# Patient Record
Sex: Female | Born: 1960 | Race: White | Hispanic: No | Marital: Married | Smoking: Never smoker
Health system: Southern US, Community
[De-identification: ages and names within clinical notes are randomized; demographics above are authoritative.]

## PROBLEM LIST (undated history)

## (undated) DIAGNOSIS — E785 Hyperlipidemia, unspecified: Secondary | ICD-10-CM

## (undated) DIAGNOSIS — D649 Anemia, unspecified: Secondary | ICD-10-CM

## (undated) DIAGNOSIS — L309 Dermatitis, unspecified: Secondary | ICD-10-CM

## (undated) DIAGNOSIS — L509 Urticaria, unspecified: Secondary | ICD-10-CM

## (undated) DIAGNOSIS — T4145XA Adverse effect of unspecified anesthetic, initial encounter: Secondary | ICD-10-CM

## (undated) DIAGNOSIS — R112 Nausea with vomiting, unspecified: Secondary | ICD-10-CM

## (undated) DIAGNOSIS — T7840XA Allergy, unspecified, initial encounter: Secondary | ICD-10-CM

## (undated) DIAGNOSIS — J329 Chronic sinusitis, unspecified: Secondary | ICD-10-CM

## (undated) DIAGNOSIS — R51 Headache: Secondary | ICD-10-CM

## (undated) DIAGNOSIS — Z9889 Other specified postprocedural states: Secondary | ICD-10-CM

## (undated) DIAGNOSIS — K219 Gastro-esophageal reflux disease without esophagitis: Secondary | ICD-10-CM

## (undated) DIAGNOSIS — T8859XA Other complications of anesthesia, initial encounter: Secondary | ICD-10-CM

## (undated) DIAGNOSIS — J189 Pneumonia, unspecified organism: Secondary | ICD-10-CM

## (undated) DIAGNOSIS — T783XXA Angioneurotic edema, initial encounter: Secondary | ICD-10-CM

## (undated) HISTORY — DX: Allergy, unspecified, initial encounter: T78.40XA

## (undated) HISTORY — PX: TONSILLECTOMY AND ADENOIDECTOMY: SUR1326

## (undated) HISTORY — PX: TYMPANOSTOMY TUBE PLACEMENT: SHX32

## (undated) HISTORY — PX: DILATION AND CURETTAGE OF UTERUS: SHX78

## (undated) HISTORY — PX: ADENOIDECTOMY: SUR15

## (undated) HISTORY — DX: Dermatitis, unspecified: L30.9

## (undated) HISTORY — DX: Angioneurotic edema, initial encounter: T78.3XXA

## (undated) HISTORY — PX: SINOSCOPY: SHX187

## (undated) HISTORY — PX: TYMPANOPLASTY: SHX33

## (undated) HISTORY — DX: Chronic sinusitis, unspecified: J32.9

## (undated) HISTORY — PX: ENDOMETRIAL ABLATION: SHX621

## (undated) HISTORY — DX: Urticaria, unspecified: L50.9

## (undated) HISTORY — DX: Hyperlipidemia, unspecified: E78.5

## (undated) HISTORY — PX: HEAD & NECK WOUND REPAIR / CLOSURE: SUR1142

## (undated) HISTORY — PX: SKIN GRAFT: SHX250

---

## 1987-08-26 HISTORY — PX: FACIAL FRACTURE SURGERY: SHX1570

## 2005-02-14 ENCOUNTER — Ambulatory Visit (HOSPITAL_BASED_OUTPATIENT_CLINIC_OR_DEPARTMENT_OTHER): Admission: RE | Admit: 2005-02-14 | Discharge: 2005-02-14 | Payer: Self-pay | Admitting: Otolaryngology

## 2005-02-14 ENCOUNTER — Ambulatory Visit (HOSPITAL_COMMUNITY): Admission: RE | Admit: 2005-02-14 | Discharge: 2005-02-14 | Payer: Self-pay | Admitting: Otolaryngology

## 2006-05-11 ENCOUNTER — Other Ambulatory Visit: Admission: RE | Admit: 2006-05-11 | Discharge: 2006-05-11 | Payer: Self-pay | Admitting: Obstetrics and Gynecology

## 2006-09-11 ENCOUNTER — Ambulatory Visit (HOSPITAL_COMMUNITY): Admission: RE | Admit: 2006-09-11 | Discharge: 2006-09-11 | Payer: Self-pay | Admitting: Family Medicine

## 2007-07-28 ENCOUNTER — Ambulatory Visit (HOSPITAL_COMMUNITY): Admission: RE | Admit: 2007-07-28 | Discharge: 2007-07-28 | Payer: Self-pay | Admitting: Family Medicine

## 2007-12-08 ENCOUNTER — Other Ambulatory Visit: Admission: RE | Admit: 2007-12-08 | Discharge: 2007-12-08 | Payer: Self-pay | Admitting: Obstetrics and Gynecology

## 2008-12-11 ENCOUNTER — Other Ambulatory Visit: Admission: RE | Admit: 2008-12-11 | Discharge: 2008-12-11 | Payer: Self-pay | Admitting: Obstetrics and Gynecology

## 2009-12-12 ENCOUNTER — Other Ambulatory Visit: Admission: RE | Admit: 2009-12-12 | Discharge: 2009-12-12 | Payer: Self-pay | Admitting: Obstetrics and Gynecology

## 2010-12-31 ENCOUNTER — Other Ambulatory Visit (HOSPITAL_COMMUNITY)
Admission: RE | Admit: 2010-12-31 | Discharge: 2010-12-31 | Disposition: A | Discharge status: Home / Self Care | Payer: BC Managed Care – PPO | Source: Ambulatory Visit | Attending: Obstetrics and Gynecology | Admitting: Obstetrics and Gynecology

## 2010-12-31 ENCOUNTER — Other Ambulatory Visit: Payer: Self-pay | Admitting: Obstetrics and Gynecology

## 2010-12-31 DIAGNOSIS — Z01419 Encounter for gynecological examination (general) (routine) without abnormal findings: Secondary | ICD-10-CM | POA: Insufficient documentation

## 2011-01-10 NOTE — Op Note (Signed)
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   POSTOPERATIVE DIAGNOSIS:  Right tympanic membrane perforation with  conductive hearing loss.   PROCEDURE:  Right underlay tympanoplasty.   SURGEON:  Lucky Cowboy, M.D.   ANESTHESIA:  General endotracheal anesthesia.   ESTIMATED BLOOD LOSS:  20 mL.   SPECIMENS:  None.   COMPLICATIONS:  None.   INDICATIONS:  This patient is a 50 year old female with a very long history  of right tympanic membrane perforation and at least a 35 dB conductive  hearing loss.  She has had problems with intermittent otorrhea, which has  been resolved for several months.  For these reasons, the above procedure is  performed.   FINDINGS:  The patient was noted to have approximately 75% tympanic membrane  perforation involving the anterior superior, inferior and posterior inferior  quadrants.   PROCEDURE:  The patient was taken to the operating room and placed on the  table in the supine position.  She was then placed under general  endotracheal anesthesia and the table rotated counterclockwise 90 degrees.  The postauricular area was shaved and prepped with Betadine and draped in  the usual sterile fashion.  The patient was then prepped with Betadine and  draped in the usual sterile fashion. Lidocaine 1% with the 100,000  epinephrine was used to inject the right postauricular region.  After  allowing time for vasoconstrictive effect, the postauricular curvilinear  incision was made approximately 1 cm posterior to the postauricular sulcus.  Subcutaneous  suprafascial flaps were elevated anteriorly.  A fascia graft  was harvested superiorly and put in a fascia press and dried.  The  temporalis fascia was then divided in a T-shaped fashion using a #15 blade.  The fascia was then elevated anteriorly using a periosteal elevator.  Please  note that the four-quadrant injection was first performed.  This was using  1% lidocaine with 1:100,000 of epinephrine.  Once this was performed, a  Beaver blade was used to make postauricular 180-degree incision with back  cuts down to the tympanic membrane at 12 o'clock and 6 o'clock.  The flap  was then elevated anteriorly and approximately 1 mm and posteriorly 1 mm so  that it could be identified from the postauricular approach.   Back to the post auricular approach, the pinna was elevated anteriorly, held  in place with a Penrose.  The microscope was then used to evaluate the point  of entry into the ear canal, which was then identified.  The self-retaining  retractors were placed.  The tympanomeatal flap was then elevated and entry  was performed into the posterior inferior portion of the middle ear space.  The tympanic membrane was then elevated.  The graft was then further dried.  It was contoured to the necessary size of the graft.  The middle ear was  filled with Gelfoam soaked with Ciprodex otic.  The underlay graft was then  placed.  The edges of the tympanic membrane perforation had been previously  freshened while the tympanic membrane with its original native position.  The graft was then replaced and in position and the tympanic membrane laid  down.  It was then laid out into the ear canal and Gelfoam placed on top and  used to fill the ear canal.  The ear was placed into its original position.  There was closure of the T-shaped fascial incision in a simple interrupted  fashion using 3-0 Vicryl.  Care was used to re-elevate the skin flaps in the  ear canal.  Further Gelfoam was placed.   Subcutaneous tissues were  reapproximated again in a simple interrupted buried fashion and the skin  closed in a running interlocking stitch using 5-0 Prolene.  A mastoid  dressing was applied.  The table was rotated clockwise 90 degrees to its  original position and the patient awakened from anesthesia.  She was taken  to the Post Anesthesia Care Unit in stable condition.  There were no  complications.      Lucky Cowboy, MD  Electronically Signed     SJ/MEDQ  D:  04/10/2005  T:  04/11/2005  Job:  [id number]

## 2011-01-30 ENCOUNTER — Other Ambulatory Visit: Payer: Self-pay | Admitting: Obstetrics and Gynecology

## 2011-04-15 ENCOUNTER — Telehealth: Payer: Self-pay

## 2011-04-15 DIAGNOSIS — Z139 Encounter for screening, unspecified: Secondary | ICD-10-CM

## 2011-04-15 NOTE — Telephone Encounter (Signed)
Gastroenterology Pre-Procedure Form  Request Date: 04/15/2011    Requesting Physician: Lilyan Punt     PATIENT INFORMATION:  Joyce Jacobs is a 50 y.o., female (DOB=1961-01-06).  PROCEDURE: Procedure(s) requested: colonoscopy Procedure Reason: screening for colon cancer  PATIENT REVIEW QUESTIONS: The patient reports the following:   1. Diabetes Melitis: no 2. Joint replacements in the past 12 months: no 3. Major health problems in the past 3 months: no 4. Has an artificial valve or MVP:no 5. Has been advised in past to take antibiotics in advance of a procedure like teeth cleaning: no}    MEDICATIONS & ALLERGIES:    Patient reports the following regarding taking any blood thinners:   Plavix? no Aspirin?no Coumadin?  no  Patient confirms/reports the following medications:  Current Outpatient Prescriptions  Medication Sig Dispense Refill  . baclofen (LIORESAL) 10 MG tablet Take 10 mg by mouth daily.        . fish oil-omega-3 fatty acids 1000 MG capsule Take 1 g by mouth daily.        . fluticasone (FLONASE) 50 MCG/ACT nasal spray Place 2 sprays into the nose daily. As directed       . guaiFENesin (MUCINEX) 600 MG 12 hr tablet Take 1,200 mg by mouth.        Marland Kitchen omeprazole (PRILOSEC OTC) 20 MG tablet Take 20 mg by mouth daily. As needed       . topiramate (TOPAMAX) 100 MG tablet Take 100 mg by mouth 2 (two) times daily.        Marland Kitchen UNKNOWN TO PATIENT Imitrex     As needed       . UNKNOWN TO PATIENT Vitamin D2      One tablet twice daily         Patient confirms/reports the following allergies:  Allergies  Allergen Reactions  . Codeine Nausea Only  . Vicodin (Hydrocodone-Acetaminophen) Itching    Patient is appropriate to schedule for requested procedure(s): yes  AUTHORIZATION INFORMATION Primary Insurance:   ID #:   Group Pre-Cert / Auth required Pre-Cert / Auth #:   Secondary Insurance:   ID  Pre-Cert / Auth required: Pre-Cert / Auth #:   Orders Placed This  Encounter  Procedures  . Procedural/ Surgical Case Request: COLONOSCOPY    Standing Status: Future     Number of Occurrences:      Standing Expiration Date: 04/14/2012    Order Specific Question:  Pre-op diagnosis    Answer:  Screening    SCHEDULE INFORMATION: Procedure has been scheduled as follows:  Date: 05/12/2011     Time: 8:30 AM  Location: [facility] Short Stay  This Gastroenterology Pre-Precedure Form is being routed to the following provider(s) for review: Lorenza Burton, NP    Mailed the Rx and instructions today, 04/16/2011.

## 2011-04-15 NOTE — Telephone Encounter (Signed)
OK for colonoscopy.  

## 2011-05-09 MED ORDER — SODIUM CHLORIDE 0.45 % IV SOLN
Freq: Once | INTRAVENOUS | Status: AC
  Administered 2011-05-12: 08:00:00 via INTRAVENOUS

## 2011-05-12 ENCOUNTER — Ambulatory Visit (HOSPITAL_COMMUNITY)
Admission: RE | Admit: 2011-05-12 | Discharge: 2011-05-12 | Disposition: A | Discharge status: Home / Self Care | Payer: BC Managed Care – PPO | Source: Ambulatory Visit | Attending: Internal Medicine | Admitting: Internal Medicine

## 2011-05-12 ENCOUNTER — Encounter (HOSPITAL_COMMUNITY)
Admission: RE | Disposition: A | Discharge status: Home / Self Care | Payer: Self-pay | Source: Ambulatory Visit | Attending: Internal Medicine

## 2011-05-12 ENCOUNTER — Encounter (HOSPITAL_COMMUNITY): Payer: Self-pay

## 2011-05-12 DIAGNOSIS — Z1211 Encounter for screening for malignant neoplasm of colon: Secondary | ICD-10-CM | POA: Insufficient documentation

## 2011-05-12 HISTORY — DX: Anemia, unspecified: D64.9

## 2011-05-12 HISTORY — DX: Adverse effect of unspecified anesthetic, initial encounter: T41.45XA

## 2011-05-12 HISTORY — DX: Other complications of anesthesia, initial encounter: T88.59XA

## 2011-05-12 HISTORY — PX: COLONOSCOPY: SHX5424

## 2011-05-12 HISTORY — DX: Gastro-esophageal reflux disease without esophagitis: K21.9

## 2011-05-12 HISTORY — DX: Headache: R51

## 2011-05-12 SURGERY — COLONOSCOPY
Anesthesia: Moderate Sedation

## 2011-05-12 MED ORDER — MEPERIDINE HCL 100 MG/ML IJ SOLN
INTRAMUSCULAR | Status: DC | PRN
  Administered 2011-05-12 (×2): 50 mg via INTRAVENOUS

## 2011-05-12 MED ORDER — ONDANSETRON HCL 4 MG/2ML IJ SOLN
INTRAMUSCULAR | Status: AC
  Administered 2011-05-12: 4 mg via INTRAVENOUS
  Filled 2011-05-12: qty 2

## 2011-05-12 MED ORDER — ONDANSETRON HCL 4 MG/2ML IJ SOLN
4.0000 mg | Freq: Once | INTRAMUSCULAR | Status: AC
  Administered 2011-05-12: 4 mg via INTRAVENOUS

## 2011-05-12 MED ORDER — MIDAZOLAM HCL 5 MG/5ML IJ SOLN
INTRAMUSCULAR | Status: DC | PRN
  Administered 2011-05-12 (×2): 2 mg via INTRAVENOUS

## 2011-05-12 MED ORDER — MEPERIDINE HCL 100 MG/ML IJ SOLN
INTRAMUSCULAR | Status: AC
  Filled 2011-05-12: qty 2

## 2011-05-12 MED ORDER — MIDAZOLAM HCL 5 MG/5ML IJ SOLN
INTRAMUSCULAR | Status: AC
  Filled 2011-05-12: qty 10

## 2011-05-12 MED ORDER — STERILE WATER FOR IRRIGATION IR SOLN
Status: DC | PRN
  Administered 2011-05-12: 08:00:00

## 2011-05-12 NOTE — H&P (Signed)
Primary Care Physician:  No primary provider on file. Primary Gastroenterologist:  Dr.  Jena Gauss  Pre-Procedure History & Physical: HPI:  Joyce Jacobs is a 50 y.o. female is here for a screening colonoscopy. Here for her first ever average risk screening colonoscopy. No family history of polyps or colon cancer. No lower GI tract symptoms.  Past Medical History  Diagnosis Date  . Complication of anesthesia     nausea and vomiting  . Asthma     allergy related  . Headache     migraines  . Anemia   . GERD (gastroesophageal reflux disease)     Past Surgical History  Procedure Date  . Dilation and curettage of uterus   . Cesarean section     times three  . Endometrial ablation   . Head & neck wound repair / closure     times 5    Prior to Admission medications   Medication Sig Start Date End Date Taking? Authorizing Provider  baclofen (LIORESAL) 10 MG tablet Take 10 mg by mouth daily.     Yes Historical Provider, MD  fish oil-omega-3 fatty acids 1000 MG capsule Take 1 g by mouth daily.     Yes Historical Provider, MD  fluticasone (FLONASE) 50 MCG/ACT nasal spray Place 2 sprays into the nose daily. As directed    Yes Historical Provider, MD  guaiFENesin (MUCINEX) 600 MG 12 hr tablet Take 1,200 mg by mouth.     Yes Historical Provider, MD  omeprazole (PRILOSEC OTC) 20 MG tablet Take 20 mg by mouth daily. As needed    Yes Historical Provider, MD  topiramate (TOPAMAX) 100 MG tablet Take 100 mg by mouth 2 (two) times daily.     Yes Historical Provider, MD  UNKNOWN TO PATIENT Imitrex     As needed    Yes Historical Provider, MD  UNKNOWN TO PATIENT Vitamin D2      One tablet twice daily    Yes Historical Provider, MD    Allergies as of 04/15/2011 - Review Complete 04/15/2011  Allergen Reaction Noted  . Codeine Nausea Only 04/15/2011  . Vicodin (hydrocodone-acetaminophen) Itching 04/15/2011    History reviewed. No pertinent family history.  History   Social History  .  Marital Status: Married    Spouse Name: N/A    Number of Children: N/A  . Years of Education: N/A   Occupational History  . Not on file.   Social History Main Topics  . Smoking status: Never Smoker   . Smokeless tobacco: Not on file  . Alcohol Use: No  . Drug Use: No  . Sexually Active:    Other Topics Concern  . Not on file   Social History Narrative  . No narrative on file    Review of Systems: See HPI, otherwise negative ROS  Physical Exam: BP 120/75  Pulse 81  Temp(Src) 98 F (36.7 C) (Oral)  Resp 19  Ht 5\' 4"  (1.626 m)  Wt 148 lb (67.132 kg)  BMI 25.40 kg/m2  SpO2 98%  LMP 04/28/2011 General:   Alert,  Well-developed, well-nourished, pleasant and cooperative in NAD Head:  Normocephalic and atraumatic. Eyes:  Sclera clear, no icterus.   Conjunctiva pink. Ears:  Normal auditory acuity. Nose:  No deformity, discharge,  or lesions. Mouth:  No deformity or lesions, dentition normal. Neck:  Supple; no masses or thyromegaly. Lungs:  Clear throughout to auscultation.   No wheezes, crackles, or rhonchi. No acute distress. Heart:  Regular rate and rhythm;  no murmurs, clicks, rubs,  or gallops. Abdomen:  Soft, nontender and nondistended. No masses, hepatosplenomegaly or hernias noted. Normal bowel sounds, without guarding, and without rebound.   Msk:  Symmetrical without gross deformities. Normal posture. Pulses:  Normal pulses noted. Extremities:  Without clubbing or edema. Neurologic:  Alert and  oriented x4;  grossly normal neurologically. Skin:  Intact without significant lesions or rashes. Cervical Nodes:  No significant cervical adenopathy. Psych:  Alert and cooperative. Normal mood and affect.  Impression/Plan: Joyce Jacobs is now here to undergo a screening colonoscopy.  Risks, benefits, limitations, imponderables and alternatives regarding colonoscopy have been reviewed with the patient. Questions have been answered. All parties agreeable.

## 2011-05-20 ENCOUNTER — Encounter (HOSPITAL_COMMUNITY): Payer: Self-pay | Admitting: Internal Medicine

## 2012-02-04 ENCOUNTER — Other Ambulatory Visit (HOSPITAL_COMMUNITY)
Admission: RE | Admit: 2012-02-04 | Discharge: 2012-02-04 | Disposition: A | Discharge status: Home / Self Care | Payer: BC Managed Care – PPO | Source: Ambulatory Visit | Attending: Obstetrics and Gynecology | Admitting: Obstetrics and Gynecology

## 2012-02-04 ENCOUNTER — Other Ambulatory Visit: Payer: Self-pay | Admitting: Obstetrics and Gynecology

## 2012-02-04 DIAGNOSIS — Z1159 Encounter for screening for other viral diseases: Secondary | ICD-10-CM | POA: Insufficient documentation

## 2012-02-04 DIAGNOSIS — Z01419 Encounter for gynecological examination (general) (routine) without abnormal findings: Secondary | ICD-10-CM | POA: Insufficient documentation

## 2012-02-12 ENCOUNTER — Other Ambulatory Visit: Payer: Self-pay | Admitting: Family Medicine

## 2012-02-12 DIAGNOSIS — R109 Unspecified abdominal pain: Secondary | ICD-10-CM

## 2012-02-17 ENCOUNTER — Other Ambulatory Visit: Payer: Self-pay | Admitting: Family Medicine

## 2012-02-17 ENCOUNTER — Ambulatory Visit (HOSPITAL_COMMUNITY)
Admission: RE | Admit: 2012-02-17 | Discharge: 2012-02-17 | Disposition: A | Discharge status: Home / Self Care | Payer: BC Managed Care – PPO | Source: Ambulatory Visit | Attending: Family Medicine | Admitting: Family Medicine

## 2012-02-17 DIAGNOSIS — R9389 Abnormal findings on diagnostic imaging of other specified body structures: Secondary | ICD-10-CM | POA: Insufficient documentation

## 2012-02-17 DIAGNOSIS — R109 Unspecified abdominal pain: Secondary | ICD-10-CM

## 2012-02-17 DIAGNOSIS — Q619 Cystic kidney disease, unspecified: Secondary | ICD-10-CM | POA: Insufficient documentation

## 2012-02-17 MED ORDER — IOHEXOL 300 MG/ML  SOLN
100.0000 mL | Freq: Once | INTRAMUSCULAR | Status: AC | PRN
  Administered 2012-02-17: 100 mL via INTRAVENOUS

## 2012-11-24 ENCOUNTER — Encounter: Payer: Self-pay | Admitting: *Deleted

## 2012-11-26 ENCOUNTER — Encounter: Payer: Self-pay | Admitting: Family Medicine

## 2012-11-26 ENCOUNTER — Ambulatory Visit (INDEPENDENT_AMBULATORY_CARE_PROVIDER_SITE_OTHER): Payer: BC Managed Care – PPO | Admitting: Family Medicine

## 2012-11-26 VITALS — BP 106/72 | Temp 98.2°F | Wt 155.4 lb

## 2012-11-26 DIAGNOSIS — J309 Allergic rhinitis, unspecified: Secondary | ICD-10-CM | POA: Insufficient documentation

## 2012-11-26 DIAGNOSIS — J322 Chronic ethmoidal sinusitis: Secondary | ICD-10-CM

## 2012-11-26 MED ORDER — FLUTICASONE PROPIONATE 50 MCG/ACT NA SUSP: NASAL | Status: DC

## 2012-11-26 MED ORDER — AMOXICILLIN-POT CLAVULANATE 875-125 MG PO TABS: 1.0000 | ORAL_TABLET | Freq: Two times a day (BID) | ORAL | Status: AC

## 2012-11-26 NOTE — Progress Notes (Signed)
Subjective:    Patient ID: Joyce Jacobs, female    DOB: 1961-04-02, 52 y.o.   MRN: [id number]  Sore Throat  This is a new problem. The current episode started in the past 7 days. The problem has been gradually worsening. The pain is worse on the left side. The maximum temperature recorded prior to her arrival was 100 - 100.9 F. The fever has been present for 1 to 2 days. The pain is at a severity of 5/10. The pain is moderate. Associated symptoms include congestion, coughing, ear pain and headaches. Associated symptoms comments: Frontal and maxillary. She has tried cool liquids and gargles for the symptoms. The treatment provided mild relief.  Otalgia  Associated symptoms include coughing and headaches.  Sinus Problem Associated symptoms include congestion, coughing, ear pain and headaches.      Review of Systems  Constitutional: Positive for fatigue.  HENT: Positive for ear pain and congestion.   Respiratory: Positive for cough.   Neurological: Positive for headaches.  All other systems reviewed and are negative.       Objective:   Physical Exam  Alert hydration good. HEENT moderate nasal congestion. Frontal tenderness. Vitals reviewed. Lungs clear. Heart rare rhythm.      Assessment & Plan:  Impression acute sinusitis. Also complicated by some allergy rhinitis. Plan Augmentin 875 twice a day 10 days. Symptomatic care discussed. WSL

## 2013-01-20 ENCOUNTER — Telehealth: Payer: Self-pay | Admitting: Family Medicine

## 2013-01-20 NOTE — Telephone Encounter (Signed)
Patient needs a doctors note saying that she can drink water during EOG testing due to medical reasons.

## 2013-01-20 NOTE — Telephone Encounter (Signed)
Please write this on letterhead and I will sign it

## 2013-01-21 NOTE — Telephone Encounter (Signed)
Letter ready for pick up. Pt notified on voicemail.

## 2013-03-02 ENCOUNTER — Other Ambulatory Visit: Payer: Self-pay | Admitting: Family Medicine

## 2013-03-02 LAB — GLUCOSE, RANDOM: Glucose, Bld: 98 mg/dL (ref 70–99)

## 2013-03-03 ENCOUNTER — Encounter: Payer: Self-pay | Admitting: Family Medicine

## 2013-03-15 ENCOUNTER — Other Ambulatory Visit: Payer: Self-pay | Admitting: Family Medicine

## 2013-04-05 ENCOUNTER — Encounter: Payer: Self-pay | Admitting: Nurse Practitioner

## 2013-04-05 ENCOUNTER — Ambulatory Visit (INDEPENDENT_AMBULATORY_CARE_PROVIDER_SITE_OTHER): Payer: BC Managed Care – PPO | Admitting: Nurse Practitioner

## 2013-04-05 VITALS — BP 122/80 | HR 80 | Ht 63.25 in | Wt 157.6 lb

## 2013-04-05 DIAGNOSIS — Z01419 Encounter for gynecological examination (general) (routine) without abnormal findings: Secondary | ICD-10-CM

## 2013-04-05 DIAGNOSIS — M7660 Achilles tendinitis, unspecified leg: Secondary | ICD-10-CM

## 2013-04-05 DIAGNOSIS — Z0000 Encounter for general adult medical examination without abnormal findings: Secondary | ICD-10-CM

## 2013-04-05 DIAGNOSIS — M7662 Achilles tendinitis, left leg: Secondary | ICD-10-CM

## 2013-04-05 DIAGNOSIS — G43909 Migraine, unspecified, not intractable, without status migrainosus: Secondary | ICD-10-CM

## 2013-04-05 MED ORDER — TOPIRAMATE 25 MG PO TABS: ORAL_TABLET | ORAL | Status: DC

## 2013-04-05 MED ORDER — TOPIRAMATE 25 MG PO TABS: 25.0000 mg | ORAL_TABLET | Freq: Every day | ORAL | Status: DC

## 2013-04-05 MED ORDER — TOPIRAMATE 50 MG PO TABS: 50.0000 mg | ORAL_TABLET | Freq: Every day | ORAL | Status: DC

## 2013-04-05 NOTE — Progress Notes (Signed)
Subjective:    Patient ID: Joyce Jacobs, female    DOB: 05-25-1961, 52 y.o.   MRN: [id number]  HPI presents for wellness checkup. Having a menstrual cycle about every 32 days, very light, only lasting about 2 days. Some slight spotting in between. This is been the same type a cycle for several years. History of endometrial ablation, had an endometrial biopsy and a D&C. Menstrual migraines are much improved. Married, same sexual partner. Sporadic mild pelvic pain, again no change. Would like to wean off her Topamax at this point. Has had left Achilles tendinitis for a long time. This is limited her activity.    Review of Systems  Constitutional: Positive for activity change. Negative for fever, appetite change and fatigue.  HENT: Negative for hearing loss, congestion, sore throat, rhinorrhea and dental problem.   Eyes: Positive for photophobia. Negative for visual disturbance.  Respiratory: Negative for cough, chest tightness and shortness of breath.   Cardiovascular: Negative for chest pain, palpitations and leg swelling.  Gastrointestinal: Negative for nausea, vomiting, abdominal pain, diarrhea, constipation and blood in stool.  Genitourinary: Negative for dysuria, urgency, frequency, vaginal discharge, enuresis, difficulty urinating, menstrual problem and pelvic pain.  Neurological: Positive for headaches.  Psychiatric/Behavioral: Negative for sleep disturbance and dysphoric mood. The patient is not nervous/anxious.        Objective:   Physical Exam  Vitals reviewed. Constitutional: She is oriented to person, place, and time. She appears well-developed. No distress.  HENT:  Right Ear: External ear normal.  Left Ear: External ear normal.  Mouth/Throat: Oropharynx is clear and moist.  Neck: Normal range of motion. Neck supple. No tracheal deviation present. No thyromegaly present.  Cardiovascular: Normal rate, regular rhythm and normal heart sounds.  Exam reveals no gallop.   No  murmur heard. Pulmonary/Chest: Effort normal and breath sounds normal.  Abdominal: Soft. She exhibits no distension and no mass. There is no tenderness.  Genitourinary: Vagina normal and uterus normal. No vaginal discharge found.  Musculoskeletal: She exhibits tenderness. She exhibits no edema.  Lymphadenopathy:    She has no cervical adenopathy.  Neurological: She is alert and oriented to person, place, and time.  Skin: Skin is warm and dry. No rash noted.  Psychiatric: She has a normal mood and affect. Her behavior is normal.   breast exam: No masses noted, axilla no adenopathy. External GU normal. Bimanual exam normal limit. Rectal exam normal, no stool for Hemoccult. Tenderness noted along the left Achilles tendon, there is a small slightly mobile rubbery cystic area noted.        Assessment & Plan:  Well woman exam  Routine general medical examination at a health care facility - Plan: MM Digital Screening  Migraine headache  Achilles tendinitis, left  Meds ordered this encounter  Medications  . topiramate (TOPAMAX) 25 MG tablet    Sig: 3 po qhs x 1 month    Dispense:  75 tablet    Refill:  0    Patient is weaning off med; please fill this Rx first, then 50 mg then 25 mg    Order Specific Question:  Supervising Provider    Answer:  Merlyn Albert [[id number]]  . topiramate (TOPAMAX) 50 MG tablet    Sig: Take 1 tablet (50 mg total) by mouth daily.    Dispense:  30 tablet    Refill:  0    Order Specific Question:  Supervising Provider    Answer:  Merlyn Albert [[id number]]  . topiramate (TOPAMAX)  25 MG tablet    Sig: Take 1 tablet (25 mg total) by mouth daily.    Dispense:  30 tablet    Refill:  0    Order Specific Question:  Supervising Provider    Answer:  Merlyn Albert [[id number]]   Slowly wean off topiramate. Call back if any problems. Given information for local podiatrist, recommend evaluation of her persistent left Achilles tendinitis. Encouraged healthy diet and  activity as tolerated. Also adequate vitamin D and calcium. Patient is unsure of her last tetanus shot, defers this today. Next physical in one year.

## 2013-04-05 NOTE — Patient Instructions (Addendum)
Luvena 3-4 x per week for vaginal health Vitamin B complex [facility]

## 2013-04-06 DIAGNOSIS — G43909 Migraine, unspecified, not intractable, without status migrainosus: Secondary | ICD-10-CM | POA: Insufficient documentation

## 2013-04-06 DIAGNOSIS — M7660 Achilles tendinitis, unspecified leg: Secondary | ICD-10-CM | POA: Insufficient documentation

## 2013-04-06 NOTE — Assessment & Plan Note (Signed)
Menstrual migraines much improved. Will wean off of Topamax over the next 3 months.

## 2013-04-06 NOTE — Assessment & Plan Note (Signed)
Recommended patient schedule appointment with local podiatrist for further evaluation.

## 2013-04-11 ENCOUNTER — Ambulatory Visit (HOSPITAL_COMMUNITY)
Admission: RE | Admit: 2013-04-11 | Discharge: 2013-04-11 | Disposition: A | Discharge status: Home / Self Care | Payer: BC Managed Care – PPO | Source: Ambulatory Visit | Attending: Nurse Practitioner | Admitting: Nurse Practitioner

## 2013-04-11 DIAGNOSIS — Z1231 Encounter for screening mammogram for malignant neoplasm of breast: Secondary | ICD-10-CM | POA: Insufficient documentation

## 2013-05-19 ENCOUNTER — Ambulatory Visit (INDEPENDENT_AMBULATORY_CARE_PROVIDER_SITE_OTHER): Payer: BC Managed Care – PPO | Admitting: Family Medicine

## 2013-05-19 ENCOUNTER — Encounter: Payer: Self-pay | Admitting: Family Medicine

## 2013-05-19 VITALS — BP 122/68 | Temp 98.2°F | Ht 63.0 in | Wt 159.6 lb

## 2013-05-19 DIAGNOSIS — J019 Acute sinusitis, unspecified: Secondary | ICD-10-CM

## 2013-05-19 MED ORDER — AMOXICILLIN-POT CLAVULANATE 875-125 MG PO TABS: 1.0000 | ORAL_TABLET | Freq: Two times a day (BID) | ORAL | Status: AC

## 2013-05-19 NOTE — Progress Notes (Deleted)
Subjective:    Patient ID: Joyce Jacobs, female    DOB: April 15, 1961, 52 y.o.   MRN: [id number]  Sinusitis This is a new problem. The current episode started in the past 7 days. Associated symptoms include congestion, coughing, headaches, a hoarse voice, sinus pressure, sneezing, a sore throat and swollen glands. Past treatments include acetaminophen and oral decongestants. The treatment provided mild relief.      Review of Systems  HENT: Positive for congestion, sore throat, hoarse voice, sneezing and sinus pressure.   Respiratory: Positive for cough.   Neurological: Positive for headaches.       Objective:   Physical Exam        Assessment & Plan:

## 2013-05-19 NOTE — Progress Notes (Signed)
Subjective:    Patient ID: Joyce Jacobs, female    DOB: 26-Oct-1960, 52 y.o.   MRN: [id number]  HPI Patient with head congestion drainage coughing sinus pressure not feeling good. No wheezing or difficulty breathing PMH benign   Review of Systems Denies vomiting diarrhea.    Objective:   Physical Exam  Mild sinus tenderness eardrums normal throat normal neck supple lungs clear      Assessment & Plan:  Sinusitis-Augmentin 875 twice a day over the course of next 10-14 days

## 2013-05-23 ENCOUNTER — Other Ambulatory Visit: Payer: Self-pay | Admitting: Family Medicine

## 2013-06-13 ENCOUNTER — Telehealth: Payer: Self-pay | Admitting: Family Medicine

## 2013-06-13 NOTE — Telephone Encounter (Signed)
Sure please get handicap form , I'll sign

## 2013-06-13 NOTE — Telephone Encounter (Signed)
Dr. Pricilla Holm is out of the office all this week.  Patient is wearing a boot on her left foot and wants to know if Dr. Lorin Picket would complete paper work for her to get a Handicap Parking sticker because [place] on [place]  Please call patient to discuss

## 2013-06-13 NOTE — Telephone Encounter (Signed)
Left message on voicemail notifying patient form ready for pickup.  

## 2013-09-26 ENCOUNTER — Ambulatory Visit (INDEPENDENT_AMBULATORY_CARE_PROVIDER_SITE_OTHER): Payer: BC Managed Care – PPO | Admitting: Family Medicine

## 2013-09-26 ENCOUNTER — Encounter: Payer: Self-pay | Admitting: Family Medicine

## 2013-09-26 VITALS — BP 122/80 | Temp 98.6°F | Ht 63.0 in | Wt 157.0 lb

## 2013-09-26 DIAGNOSIS — T783XXA Angioneurotic edema, initial encounter: Secondary | ICD-10-CM

## 2013-09-26 MED ORDER — PREDNISONE 20 MG PO TABS: ORAL_TABLET | ORAL | Status: AC

## 2013-09-26 MED ORDER — RANITIDINE HCL 300 MG PO TABS: 300.0000 mg | ORAL_TABLET | Freq: Every day | ORAL | Status: DC

## 2013-09-26 NOTE — Progress Notes (Signed)
Subjective:    Patient ID: Joyce Jacobs, female    DOB: October 25, 1960, 53 y.o.   MRN: [id number]  Allergic Reaction This is a recurrent problem. It is unknown what she was exposed to. (Face red and stinging, eyes swollen shut) Past treatments include diphenhydramine.   Patient with moderate to advanced swelling of the face. There is no swelling of the airway this occurred over the weekend occurred once before several weeks ago. Patient had seen an allergist at one time for her allergies and they did significant amount testing and did not find any allergies that were specific.   Review of Systems Patient denies compromise of airway denies high fever chills sweats denies any recent changes in medicines.    Objective:   Physical Exam Still has some redness of the face some puffiness. No other particular problems. The lungs have no sign of any wheezing or airway collapse.      Assessment & Plan:  Significant allergic reaction/idiopathic angioedema-I. feel this patient would benefit from Allegra 180 mg daily may use generic version. Unfortunately she states every allergy tablet gives her headaches she will try it again. In addition to this Zantac 300 mg daily. Prednisone taper given in case she has [place] again she can use it. If she has progressive troubles consider referral to [facility].

## 2013-11-17 ENCOUNTER — Other Ambulatory Visit: Payer: Self-pay | Admitting: Family Medicine

## 2014-05-04 ENCOUNTER — Ambulatory Visit (INDEPENDENT_AMBULATORY_CARE_PROVIDER_SITE_OTHER): Payer: BC Managed Care – PPO | Admitting: Nurse Practitioner

## 2014-05-04 ENCOUNTER — Encounter: Payer: Self-pay | Admitting: Nurse Practitioner

## 2014-05-04 VITALS — BP 122/80 | Ht 63.0 in | Wt 158.0 lb

## 2014-05-04 DIAGNOSIS — Z79899 Other long term (current) drug therapy: Secondary | ICD-10-CM

## 2014-05-04 DIAGNOSIS — Z13220 Encounter for screening for lipoid disorders: Secondary | ICD-10-CM

## 2014-05-04 DIAGNOSIS — D649 Anemia, unspecified: Secondary | ICD-10-CM

## 2014-05-04 DIAGNOSIS — R5381 Other malaise: Secondary | ICD-10-CM

## 2014-05-04 DIAGNOSIS — R5383 Other fatigue: Secondary | ICD-10-CM

## 2014-05-04 DIAGNOSIS — J300 Vasomotor rhinitis: Secondary | ICD-10-CM

## 2014-05-04 DIAGNOSIS — J309 Allergic rhinitis, unspecified: Secondary | ICD-10-CM

## 2014-05-04 MED ORDER — PREDNISONE 20 MG PO TABS: ORAL_TABLET | ORAL | Status: DC

## 2014-05-04 MED ORDER — SUMATRIPTAN SUCCINATE 100 MG PO TABS: ORAL_TABLET | ORAL | Status: DC

## 2014-05-05 ENCOUNTER — Telehealth: Payer: Self-pay | Admitting: Family Medicine

## 2014-05-05 MED ORDER — LEVOFLOXACIN 500 MG PO TABS: 500.0000 mg | ORAL_TABLET | Freq: Every day | ORAL | Status: AC

## 2014-05-05 NOTE — Telephone Encounter (Signed)
Levaquin 500 mg one daily #10 followup if problems

## 2014-05-05 NOTE — Telephone Encounter (Signed)
Patient was told by Hoyle Sauer yesterday to call if she thought she was getting a sinus infection. She is now experiencing discolored mucous. She wants to know if we can call in an antibiotic.   [facility]

## 2014-05-05 NOTE — Telephone Encounter (Signed)
Rx sent electronically to pharmacy. Patient notified. 

## 2014-05-06 ENCOUNTER — Encounter: Payer: Self-pay | Admitting: Nurse Practitioner

## 2014-05-06 NOTE — Progress Notes (Signed)
Subjective:  Presents for complaints of sinus pressure and congestion that began yesterday. Patient states she accidentally did a vinegar/baking soda rinse of her sinuses. Since then has had burning and congestion in her sinus area. No fever. No headache. Cough mainly this morning. Slight tightness in the chest with deep breath, no actual wheezing. Used her inhaler which helped. No sore throat. Some ear pressure.  Objective:   BP 122/80  Ht 5\' 3"  (1.6 m)  Wt 158 lb (71.668 kg)  BMI 28.00 kg/m2  LMP 05/04/2014 NAD. Alert, oriented. TMs mild clear effusion, no erythema. Nasal mucosa mildly erythematous, no bleeding noted. Pharynx injected with clear PND noted. Neck supple with mild soft anterior adenopathy. Lungs clear. Heart regular rate rhythm.  Assessment: Vasomotor rhinitis  Anemia, unspecified anemia type - Plan: CBC with Differential, Ferritin  Screening for lipoid disorders - Plan: Lipid panel  Encounter for long-term (current) use of other medications - Plan: Hepatic function panel, Basic metabolic panel  Other malaise and fatigue - Plan: TSH, Vit D  25 hydroxy (rtn osteoporosis monitoring)  Plan:  Meds ordered this encounter  Medications  . predniSONE (DELTASONE) 20 MG tablet    Sig: 2 po qd x 5 d    Dispense:  10 tablet    Refill:  0    Order Specific Question:  Supervising Provider    Answer:  Mikey Kirschner [[id number]]  . SUMAtriptan (IMITREX) 100 MG tablet    Sig: TAKE ONE TABLET BY MOUTH AT FIRST SIGN OF MIGRAINE. MAY REPEAT IN 2 HOURS. MAX 2 PER 24 HRS    Dispense:  9 tablet    Refill:  5    Order Specific Question:  Supervising Provider    Answer:  Mikey Kirschner [[id number]]   Depo-Medrol 40 mg IM now. Tomorrow start prednisone taper. Continue OTC antihistamines as directed. Also given paperwork for routine lab work to be done before her physical later this month. Call back if symptoms worsen or persist.

## 2014-05-08 MED ORDER — METHYLPREDNISOLONE ACETATE 40 MG/ML IJ SUSP
40.0000 mg | Freq: Once | INTRAMUSCULAR | Status: AC
  Administered 2014-05-04: 40 mg via INTRAMUSCULAR

## 2014-06-12 ENCOUNTER — Encounter: Payer: BC Managed Care – PPO | Admitting: Nurse Practitioner

## 2014-06-14 LAB — TSH: TSH: 1.954 u[IU]/mL (ref 0.350–4.500)

## 2014-06-14 LAB — CBC WITH DIFFERENTIAL/PLATELET
BASOS PCT: 1 % (ref 0–1)
Basophils Absolute: 0.1 10*3/uL (ref 0.0–0.1)
EOS ABS: 0.2 10*3/uL (ref 0.0–0.7)
EOS PCT: 3 % (ref 0–5)
HCT: 39.3 % (ref 36.0–46.0)
Hemoglobin: 12.9 g/dL (ref 12.0–15.0)
LYMPHS ABS: 1.8 10*3/uL (ref 0.7–4.0)
Lymphocytes Relative: 30 % (ref 12–46)
MCH: 29.5 pg (ref 26.0–34.0)
MCHC: 32.8 g/dL (ref 30.0–36.0)
MCV: 89.9 fL (ref 78.0–100.0)
Monocytes Absolute: 0.5 10*3/uL (ref 0.1–1.0)
Monocytes Relative: 8 % (ref 3–12)
NEUTROS PCT: 58 % (ref 43–77)
Neutro Abs: 3.4 10*3/uL (ref 1.7–7.7)
PLATELETS: 220 10*3/uL (ref 150–400)
RBC: 4.37 MIL/uL (ref 3.87–5.11)
RDW: 13.7 % (ref 11.5–15.5)
WBC: 5.9 10*3/uL (ref 4.0–10.5)

## 2014-06-14 LAB — HEPATIC FUNCTION PANEL
ALT: 12 U/L (ref 0–35)
AST: 15 U/L (ref 0–37)
Albumin: 4 g/dL (ref 3.5–5.2)
Alkaline Phosphatase: 40 U/L (ref 39–117)
BILIRUBIN DIRECT: 0.1 mg/dL (ref 0.0–0.3)
BILIRUBIN INDIRECT: 0.4 mg/dL (ref 0.2–1.2)
BILIRUBIN TOTAL: 0.5 mg/dL (ref 0.2–1.2)
Total Protein: 6.3 g/dL (ref 6.0–8.3)

## 2014-06-14 LAB — LIPID PANEL
CHOL/HDL RATIO: 6.6 ratio
Cholesterol: 276 mg/dL — ABNORMAL HIGH (ref 0–200)
HDL: 42 mg/dL (ref >39)
LDL Cholesterol: 198 mg/dL — ABNORMAL HIGH (ref 0–99)
Triglycerides: 178 mg/dL — ABNORMAL HIGH (ref <150)
VLDL: 36 mg/dL (ref 0–40)

## 2014-06-14 LAB — BASIC METABOLIC PANEL
BUN: 11 mg/dL (ref 6–23)
CO2: 25 meq/L (ref 19–32)
Calcium: 8.9 mg/dL (ref 8.4–10.5)
Chloride: 105 mEq/L (ref 96–112)
Creat: 0.74 mg/dL (ref 0.50–1.10)
GLUCOSE: 89 mg/dL (ref 70–99)
POTASSIUM: 4.5 meq/L (ref 3.5–5.3)
SODIUM: 138 meq/L (ref 135–145)

## 2014-06-14 LAB — FERRITIN: Ferritin: 76 ng/mL (ref 10–291)

## 2014-06-15 LAB — VITAMIN D 25 HYDROXY (VIT D DEFICIENCY, FRACTURES): Vit D, 25-Hydroxy: 60 ng/mL (ref 30–89)

## 2014-07-05 ENCOUNTER — Ambulatory Visit (INDEPENDENT_AMBULATORY_CARE_PROVIDER_SITE_OTHER): Payer: BC Managed Care – PPO | Admitting: Nurse Practitioner

## 2014-07-05 ENCOUNTER — Other Ambulatory Visit: Payer: Self-pay | Admitting: Family Medicine

## 2014-07-05 ENCOUNTER — Encounter: Payer: Self-pay | Admitting: Nurse Practitioner

## 2014-07-05 VITALS — BP 104/66 | Ht 63.0 in | Wt 155.0 lb

## 2014-07-05 DIAGNOSIS — Z01419 Encounter for gynecological examination (general) (routine) without abnormal findings: Secondary | ICD-10-CM

## 2014-07-05 DIAGNOSIS — G43009 Migraine without aura, not intractable, without status migrainosus: Secondary | ICD-10-CM

## 2014-07-05 DIAGNOSIS — Z0000 Encounter for general adult medical examination without abnormal findings: Secondary | ICD-10-CM

## 2014-07-05 DIAGNOSIS — E785 Hyperlipidemia, unspecified: Secondary | ICD-10-CM

## 2014-07-05 DIAGNOSIS — Z1231 Encounter for screening mammogram for malignant neoplasm of breast: Secondary | ICD-10-CM

## 2014-07-05 DIAGNOSIS — Z79899 Other long term (current) drug therapy: Secondary | ICD-10-CM

## 2014-07-05 MED ORDER — FLUTICASONE PROPIONATE 50 MCG/ACT NA SUSP: NASAL | Status: DC

## 2014-07-05 MED ORDER — BACLOFEN 10 MG PO TABS: 10.0000 mg | ORAL_TABLET | Freq: Three times a day (TID) | ORAL | Status: DC

## 2014-07-05 MED ORDER — ATORVASTATIN CALCIUM 10 MG PO TABS: 10.0000 mg | ORAL_TABLET | Freq: Every day | ORAL | Status: DC

## 2014-07-05 MED ORDER — TOPIRAMATE 100 MG PO TABS: ORAL_TABLET | ORAL | Status: DC

## 2014-07-06 ENCOUNTER — Encounter: Payer: Self-pay | Admitting: Nurse Practitioner

## 2014-07-06 DIAGNOSIS — E7849 Other hyperlipidemia: Secondary | ICD-10-CM | POA: Insufficient documentation

## 2014-07-06 NOTE — Progress Notes (Signed)
Subjective:    Patient ID: Joyce Jacobs, female    DOB: Mar 12, 1961, 53 y.o.   MRN: [id number]  HPI presents for her wellness exam. Married, same sexual partner. Regular menstrual cycles normal flow. Has had significant problems with Achilles tendinitis, this has limited her activity. Has been following a low-carb diet called whole 30 and paleo diet. Having migraines almost daily for several weeks. Has not identified any specific triggers. Has been off her Topamax. Would like to restart. Taking a half of an Imitrex 3-4 days per week. Also taking anti-inflammatory. Regular vision and dental exams.    Review of Systems  Constitutional: Negative for fever, activity change, appetite change and fatigue.  HENT: Negative for dental problem, ear pain, sinus pressure and sore throat.   Respiratory: Negative for cough, chest tightness, shortness of breath and wheezing.   Cardiovascular: Negative for chest pain and leg swelling.  Gastrointestinal: Negative for nausea, vomiting, abdominal pain, diarrhea, constipation, blood in stool and abdominal distention.  Genitourinary: Negative for dysuria, urgency, frequency, vaginal discharge, enuresis, difficulty urinating, genital sores, menstrual problem and pelvic pain.  Neurological: Positive for headaches.       Objective:   Physical Exam  Constitutional: She is oriented to person, place, and time. She appears well-developed. No distress.  HENT:  Right Ear: External ear normal.  Left Ear: External ear normal.  Mouth/Throat: Oropharynx is clear and moist.  Neck: Normal range of motion. Neck supple. No tracheal deviation present. No thyromegaly present.  Cardiovascular: Normal rate, regular rhythm and normal heart sounds.  Exam reveals no gallop.   No murmur heard. Pulmonary/Chest: Effort normal and breath sounds normal.  Abdominal: Soft. She exhibits no distension. There is no tenderness.  Genitourinary: Vagina normal and uterus normal. No  vaginal discharge found.  External GU no rashes or lesions. Vagina no discharge. Cervix normal limit in appearance, no CMT. Bimanual exam no tenderness or obvious masses. Rectal exam no masses, no stool for Hemoccult.  Musculoskeletal: She exhibits no edema.  Lymphadenopathy:    She has no cervical adenopathy.  Neurological: She is alert and oriented to person, place, and time.  Skin: Skin is warm and dry. No rash noted.  Psychiatric: She has a normal mood and affect. Her behavior is normal.  Vitals reviewed. Breast exam: No masses, axillae no adenopathy.        Assessment & Plan:   Problem List Items Addressed This Visit      Cardiovascular and Mediastinum   Migraine headache   Relevant Medications      topiramate (TOPAMAX) tablet      baclofen (LIORESAL) tablet      atorvastatin (LIPITOR) tablet     Other   Hyperlipidemia LDL goal <100   Relevant Medications      atorvastatin (LIPITOR) tablet   Other Relevant Orders      Lipid panel    Other Visit Diagnoses    Well woman exam    -  Primary    Relevant Orders       POC Hemoccult Bld/Stl (3-Cd Home Screen)    Encounter for screening mammogram for breast cancer        Relevant Orders       MM DIGITAL SCREENING BILATERAL    High risk medication use        Relevant Orders       Hepatic function panel      Meds ordered this encounter  Medications  . topiramate (TOPAMAX) 100 MG tablet  Sig: One po qhs for migraines    Dispense:  30 tablet    Refill:  2    Order Specific Question:  Supervising Provider    Answer:  Merlyn Albert [[id number]]  . baclofen (LIORESAL) 10 MG tablet    Sig: Take 1 tablet (10 mg total) by mouth 3 (three) times daily. Prn muscle spasms    Dispense:  90 each    Refill:  0    Order Specific Question:  Supervising Provider    Answer:  Merlyn Albert [[id number]]  . fluticasone (FLONASE) 50 MCG/ACT nasal spray    Sig: Two sprays each nostril daily    Dispense:  16 g    Refill:  5    Order  Specific Question:  Supervising Provider    Answer:  Merlyn Albert [[id number]]  . atorvastatin (LIPITOR) 10 MG tablet    Sig: Take 1 tablet (10 mg total) by mouth daily.    Dispense:  30 tablet    Refill:  2    Order Specific Question:  Supervising Provider    Answer:  Merlyn Albert [[id number]]   Reviewed labs from 06/14/2014. LDL remains extremely high at 198. Lengthy discussion regarding risk associated with uncontrolled hyperlipidemia. Patient has been on medication in the past. Agrees to a trial of atorvastatin 10 mg. Repeat lipid and liver profiles in 8-10 weeks. Call back sooner if any problems. Return in about 6 months (around 01/03/2015).

## 2014-07-18 ENCOUNTER — Telehealth: Payer: Self-pay | Admitting: Family Medicine

## 2014-07-18 NOTE — Telephone Encounter (Signed)
Called pt explained to her that you were off tomorrow and i didn't know if you could give her a call. She wants to discuss her topamax. She states she use to take for years then she stopped it for years. She started back on topamax on the 11th. She started with 50mg . She has been lightheaded and last week was bad. Had a lot of migraines so she increased to 100mg . That made her dizzy, lightheaded, nausea. Sunday she felt so bad that she felt like she should not be driving. She doesn't want to change meds. She wonders if she should have started with 25mg  instead of 50mg . She is still wanting you to give her a call.

## 2014-07-18 NOTE — Telephone Encounter (Signed)
Patient has requested that Dr. Arva Chafe call her sometime tomorrow about adjustment issues that she is having with her new medication Topamax. She has a mammogram scheduled for 9 so anytime after 10 should be fine.

## 2014-07-19 ENCOUNTER — Ambulatory Visit (HOSPITAL_COMMUNITY)
Admission: RE | Admit: 2014-07-19 | Discharge: 2014-07-19 | Disposition: A | Discharge status: Home / Self Care | Payer: BC Managed Care – PPO | Source: Ambulatory Visit | Attending: Nurse Practitioner | Admitting: Nurse Practitioner

## 2014-07-19 DIAGNOSIS — Z1231 Encounter for screening mammogram for malignant neoplasm of breast: Secondary | ICD-10-CM | POA: Diagnosis not present

## 2014-07-19 NOTE — Telephone Encounter (Signed)
I discussed the situation with the patient. She has been having fairly frequent migraines. She has a history of this. For now she is going to maintain Topamax 50 mg over the next few weeks she will notify me of how her headaches are doing and then based on that may pump the dose up to 75 mg (Topamax 25 mg, 3 tablets ) she states that she could not tolerate 100 mg. There is also a possibility that we may add a different medicine. She will let us know in a few weeks.

## 2014-07-31 ENCOUNTER — Encounter: Payer: Self-pay | Admitting: Family Medicine

## 2014-07-31 ENCOUNTER — Ambulatory Visit (INDEPENDENT_AMBULATORY_CARE_PROVIDER_SITE_OTHER): Payer: BC Managed Care – PPO | Admitting: Family Medicine

## 2014-07-31 VITALS — BP 124/70 | Ht 63.0 in | Wt 154.5 lb

## 2014-07-31 DIAGNOSIS — G43001 Migraine without aura, not intractable, with status migrainosus: Secondary | ICD-10-CM

## 2014-07-31 NOTE — Progress Notes (Signed)
Subjective:    Patient ID: Joyce Jacobs, female    DOB: [birth year], 53 y.o.   MRN: [id number]  Migraine  This is a chronic problem. The current episode started more than 1 month ago. The problem occurs intermittently. The problem has been gradually worsening. Radiates to: ear. The pain quality is similar to prior headaches. The quality of the pain is described as aching. The pain is moderate. Nothing aggravates the symptoms. Treatments tried: topamax. The treatment provided no relief.   Patient states that she has no other concerns at this time.  topamax triggered lots of side effects: aching ,sleepy, fatigue  The patient is having frequent headaches at least 24 days out of 30 sometimes at 7 straight days of headaches. There is no vomiting with it some but some nausea. Review of Systems     Objective:   Physical Exam There was no physical exam today the time spent today was with patient discussing her problem and possible solutions       Assessment & Plan:  15 minutes was spent with the patient discussing her headaches she is having severe headaches at least every day the week she was not able to tolerate Topamax to cause a lot of side effects  We will try Depakote 125 mg one 3 times a day. Initially she will take one twice daily. If she tolerates this then she will increase to 3 times daily. She will let us know in a couple weeks how this is doing. If it is not doing significantly improved with this we may have to go up on the dose to 250 mg maximum 3 times per day if it is causing significant side effects we may have to try something different.

## 2014-07-31 NOTE — Patient Instructions (Signed)
Start off with the depakote one 2 times a day, after 1 week then increase to 1 three times a day.  Send [place] update in 2 to 3 weeks

## 2014-09-01 ENCOUNTER — Encounter: Payer: Self-pay | Admitting: Family Medicine

## 2014-09-01 ENCOUNTER — Other Ambulatory Visit: Payer: Self-pay | Admitting: *Deleted

## 2014-09-01 ENCOUNTER — Telehealth: Payer: Self-pay | Admitting: Family Medicine

## 2014-09-01 MED ORDER — DIVALPROEX SODIUM 125 MG PO DR TAB: 125.0000 mg | DELAYED_RELEASE_TABLET | Freq: Three times a day (TID) | ORAL | Status: DC

## 2014-09-01 NOTE — Telephone Encounter (Signed)
I am not sure why it is not in the Epic med list. It's Depakote 125 mg 1 pill 3 times daily #90 with 6 refills. When ordering this please be certain that it gets listed as a medication for future use thank you

## 2014-09-01 NOTE — Telephone Encounter (Signed)
Please let pt know I received the message. Give 5 refills, follow up within 5 months

## 2014-09-01 NOTE — Telephone Encounter (Signed)
What is the dose and quantity? I do not see it on her med list.

## 2014-09-01 NOTE — Addendum Note (Signed)
Addended byCharolotte Capuchin D on: 09/01/2014 05:20 PM   Modules accepted: Orders

## 2014-10-04 ENCOUNTER — Other Ambulatory Visit: Payer: Self-pay | Admitting: Family Medicine

## 2014-11-01 ENCOUNTER — Ambulatory Visit (INDEPENDENT_AMBULATORY_CARE_PROVIDER_SITE_OTHER): Payer: BC Managed Care – PPO | Admitting: Family Medicine

## 2014-11-01 ENCOUNTER — Encounter: Payer: Self-pay | Admitting: Family Medicine

## 2014-11-01 VITALS — BP 128/82 | Temp 98.2°F | Ht 63.0 in | Wt 158.0 lb

## 2014-11-01 DIAGNOSIS — J019 Acute sinusitis, unspecified: Secondary | ICD-10-CM

## 2014-11-01 DIAGNOSIS — B9689 Other specified bacterial agents as the cause of diseases classified elsewhere: Secondary | ICD-10-CM

## 2014-11-01 MED ORDER — SUMATRIPTAN SUCCINATE 100 MG PO TABS: ORAL_TABLET | ORAL | Status: DC

## 2014-11-01 MED ORDER — LEVOFLOXACIN 500 MG PO TABS: 500.0000 mg | ORAL_TABLET | Freq: Every day | ORAL | Status: DC

## 2014-11-01 NOTE — Progress Notes (Signed)
Subjective:    Patient ID: Joyce Jacobs, female    DOB: [birth year], 54 y.o.   MRN: [id number]  Sinusitis This is a new problem. Episode onset: for the past  month  The problem has been waxing and waning since onset. There has been no fever. Associated symptoms include congestion, coughing, ear pain, headaches and sinus pressure. Past treatments include oral decongestants, saline sprays, spray decongestants and acetaminophen. The treatment provided mild relief.    Patient under significant amount of stress having frequent migraines. Not sure if the sinuses are controlled getting to this.  Review of Systems  HENT: Positive for congestion, ear pain and sinus pressure.   Respiratory: Positive for cough.   Neurological: Positive for headaches.       Objective:   Physical Exam Mild sinus tenderness nares boggy eardrums normal throat is normal neck supple lungs clear heart regular       Assessment & Plan:  Recent viral syndrome Secondary sinusitis Antibiotics prescribed Warning signs discuss  Patient will give [place] feedback in a couple weeks time how her migraines are doing we might need to adjust her medication.

## 2014-11-04 ENCOUNTER — Other Ambulatory Visit: Payer: Self-pay | Admitting: Family Medicine

## 2014-12-19 ENCOUNTER — Encounter: Payer: Self-pay | Admitting: Family Medicine

## 2014-12-19 ENCOUNTER — Ambulatory Visit (INDEPENDENT_AMBULATORY_CARE_PROVIDER_SITE_OTHER): Payer: BC Managed Care – PPO | Admitting: Family Medicine

## 2014-12-19 VITALS — BP 112/60 | Temp 98.3°F | Ht 63.0 in | Wt 157.1 lb

## 2014-12-19 DIAGNOSIS — N939 Abnormal uterine and vaginal bleeding, unspecified: Secondary | ICD-10-CM

## 2014-12-19 LAB — POCT HEMOGLOBIN: Hemoglobin: 13.4 g/dL (ref 12.2–16.2)

## 2014-12-19 NOTE — Progress Notes (Signed)
Subjective:    Patient ID: Joyce Jacobs, female    DOB: 13-Apr-1961, 54 y.o.   MRN: [id number]  Vaginal Bleeding The patient's primary symptoms include vaginal bleeding. This is a new problem. The current episode started more than 1 month ago. The problem occurs constantly. The problem has been unchanged. The pain is mild. The problem affects both sides. She is not pregnant. The vaginal discharge was normal. The vaginal bleeding is typical of menses. She has not been passing clots. She has not been passing tissue. Nothing aggravates the symptoms. She has tried nothing for the symptoms. The treatment provided no relief. She uses nothing for contraception. Her menstrual history has been irregular.   Has severe migraine issues but Depakote is helping but unfortunately will need to stop this medicine because of the vaginal bleeding   Review of Systems  Genitourinary: Positive for vaginal bleeding.       Objective:   Physical Exam  Lungs clear heart regular abdomen soft no guarding rebound or tenderness    Assessment & Plan:  This patient is having abnormal vaginal bleeding I believe she would be best served because of her age seeing gynecology in having ultrasound and possibly endometrial scraping completed. In addition to this it is possible that her Depakote is causing the irregular bleeding I would stop that medication  Migraines-after she tapers off Depakote we will consider other medication for her migraines possibly Keppra possibly beta blockers. Patient has tried imipramine in the past that caused side effects also Topamax caused side effects

## 2014-12-20 ENCOUNTER — Encounter: Payer: Self-pay | Admitting: Family Medicine

## 2014-12-21 ENCOUNTER — Other Ambulatory Visit: Payer: Self-pay | Admitting: *Deleted

## 2014-12-21 DIAGNOSIS — N950 Postmenopausal bleeding: Secondary | ICD-10-CM

## 2014-12-29 ENCOUNTER — Other Ambulatory Visit: Payer: Self-pay | Admitting: Nurse Practitioner

## 2015-02-09 ENCOUNTER — Encounter (HOSPITAL_COMMUNITY): Payer: Self-pay | Admitting: *Deleted

## 2015-02-15 ENCOUNTER — Other Ambulatory Visit: Payer: BC Managed Care – PPO | Admitting: Obstetrics and Gynecology

## 2015-03-04 NOTE — H&P (Addendum)
NAMEMarland Jacobs  BERLIN, VIERECK NO.:  [id number]  MEDICAL RECORD NO.:  [id number]  LOCATION:  PERIO                         FACILITY:  [facility]  PHYSICIAN:  Lovenia Kim, M.D.DATE OF BIRTH:  [birth year]  DATE OF ADMISSION:  03/05/2015 DATE OF DISCHARGE:                             HISTORY & PHYSICAL   Outpatient surgery on March 05, 2015.  CHIEF COMPLAINT:  Failed ablation with abnormal bleeding, menorrhagia, and secondary anemia.  HISTORY OF PRESENT ILLNESS:  A 54 year old white female, G3, P3, with abnormal bleeding.  Normal endometrial biopsy with backbleeding, endometrial ablation, symptomatic fibroids for surgical intervention.  ALLERGIES:  She has allergies to Vicodin, allergic also to Augmentin, Pravachol, Singulair, Pyridium, imipenem.  SOCIAL HISTORY:  She is a nonsmoker, nondrinker.  She denies domestic or physical violence.  She has a history of C-section x3.  PAST SURGICAL HISTORY:  She has a surgical history remarkable also for D and C, sinus surgery, turbinate surgery, tympanoplasty, tonsillectomy, C- section as noted, and NovaSure endometrial ablation.  FAMILY HISTORY:  Diabetes type 2 in her mom.  PHYSICAL EXAMINATION:  GENERAL:  She is a well-developed, well-nourished white female, in no acute distress. HEENT:  Normal. NECK:  Supple.  Full range of motion. LUNGS:  Clear. HEART:  Regular rate and rhythm. ABDOMEN:  Soft and nontender. PELVIC:  Reveals 10- to 12-week size uterus and no adnexal masses. EXTREMITIES:  There are no cords. NEUROLOGIC:  Nonfocal. SKIN:  Intact  IMPRESSION: 1. Abnormal uterine bleeding with failed endometrial ablation. 2. C-section x3. 3. Symptomatic fibroids.  PLAN:  Proceed with a da Vinci-assisted total laparoscopic hysterectomy,  BSO.  Risks of anesthesia, infection, bleeding, injury to surrounding organs with possible need for repair was discussed, delayed versus immediate complications to include bowel  and bladder injury are noted.  The patient acknowledges and wishes to Proceed. Pt declines ovarian conservation. Risks vs benefits discussed.     Lovenia Kim, M.D.     RJT/MEDQ  D:  03/04/2015  T:  03/04/2015  Job:  [id number]

## 2015-03-05 ENCOUNTER — Ambulatory Visit (HOSPITAL_COMMUNITY)
Admission: RE | Admit: 2015-03-05 | Discharge: 2015-03-06 | Disposition: A | Discharge status: Home / Self Care | Payer: BC Managed Care – PPO | Source: Ambulatory Visit | Attending: Obstetrics and Gynecology | Admitting: Obstetrics and Gynecology

## 2015-03-05 ENCOUNTER — Ambulatory Visit (HOSPITAL_COMMUNITY): Payer: BC Managed Care – PPO | Admitting: Certified Registered Nurse Anesthetist

## 2015-03-05 ENCOUNTER — Encounter (HOSPITAL_COMMUNITY): Payer: Self-pay | Admitting: *Deleted

## 2015-03-05 ENCOUNTER — Encounter (HOSPITAL_COMMUNITY)
Admission: RE | Disposition: A | Discharge status: Home / Self Care | Payer: Self-pay | Source: Ambulatory Visit | Attending: Obstetrics and Gynecology

## 2015-03-05 DIAGNOSIS — N736 Female pelvic peritoneal adhesions (postinfective): Secondary | ICD-10-CM | POA: Diagnosis not present

## 2015-03-05 DIAGNOSIS — N831 Corpus luteum cyst: Secondary | ICD-10-CM | POA: Insufficient documentation

## 2015-03-05 DIAGNOSIS — D259 Leiomyoma of uterus, unspecified: Secondary | ICD-10-CM | POA: Diagnosis not present

## 2015-03-05 DIAGNOSIS — N815 Vaginal enterocele: Secondary | ICD-10-CM | POA: Insufficient documentation

## 2015-03-05 DIAGNOSIS — N838 Other noninflammatory disorders of ovary, fallopian tube and broad ligament: Secondary | ICD-10-CM | POA: Diagnosis not present

## 2015-03-05 DIAGNOSIS — N939 Abnormal uterine and vaginal bleeding, unspecified: Secondary | ICD-10-CM | POA: Diagnosis present

## 2015-03-05 DIAGNOSIS — Z888 Allergy status to other drugs, medicaments and biological substances status: Secondary | ICD-10-CM | POA: Diagnosis not present

## 2015-03-05 DIAGNOSIS — K219 Gastro-esophageal reflux disease without esophagitis: Secondary | ICD-10-CM | POA: Insufficient documentation

## 2015-03-05 DIAGNOSIS — J45909 Unspecified asthma, uncomplicated: Secondary | ICD-10-CM | POA: Insufficient documentation

## 2015-03-05 DIAGNOSIS — D219 Benign neoplasm of connective and other soft tissue, unspecified: Secondary | ICD-10-CM | POA: Diagnosis present

## 2015-03-05 DIAGNOSIS — Z885 Allergy status to narcotic agent status: Secondary | ICD-10-CM | POA: Diagnosis not present

## 2015-03-05 DIAGNOSIS — E785 Hyperlipidemia, unspecified: Secondary | ICD-10-CM | POA: Diagnosis not present

## 2015-03-05 DIAGNOSIS — J329 Chronic sinusitis, unspecified: Secondary | ICD-10-CM | POA: Diagnosis not present

## 2015-03-05 HISTORY — PX: ROBOTIC ASSISTED TOTAL HYSTERECTOMY WITH BILATERAL SALPINGO OOPHERECTOMY: SHX6086

## 2015-03-05 LAB — BASIC METABOLIC PANEL
Anion gap: 7 (ref 5–15)
BUN: 8 mg/dL (ref 6–20)
CHLORIDE: 106 mmol/L (ref 101–111)
CO2: 26 mmol/L (ref 22–32)
Calcium: 9.2 mg/dL (ref 8.9–10.3)
Creatinine, Ser: 0.89 mg/dL (ref 0.44–1.00)
Glucose, Bld: 93 mg/dL (ref 65–99)
POTASSIUM: 4 mmol/L (ref 3.5–5.1)
SODIUM: 139 mmol/L (ref 135–145)

## 2015-03-05 LAB — CBC
HCT: 42.4 % (ref 36.0–46.0)
Hemoglobin: 14 g/dL (ref 12.0–15.0)
MCH: 30.4 pg (ref 26.0–34.0)
MCHC: 33 g/dL (ref 30.0–36.0)
MCV: 92 fL (ref 78.0–100.0)
Platelets: 237 10*3/uL (ref 150–400)
RBC: 4.61 MIL/uL (ref 3.87–5.11)
RDW: 13.9 % (ref 11.5–15.5)
WBC: 9.4 10*3/uL (ref 4.0–10.5)

## 2015-03-05 LAB — ABO/RH: ABO/RH(D): O POS

## 2015-03-05 LAB — TYPE AND SCREEN
ABO/RH(D): O POS
ANTIBODY SCREEN: NEGATIVE

## 2015-03-05 SURGERY — ROBOTIC ASSISTED TOTAL HYSTERECTOMY WITH BILATERAL SALPINGO OOPHORECTOMY
Anesthesia: General | Site: Abdomen | Laterality: Bilateral

## 2015-03-05 MED ORDER — CEFAZOLIN SODIUM-DEXTROSE 2-3 GM-% IV SOLR
2.0000 g | INTRAVENOUS | Status: AC
  Administered 2015-03-05: 2 g via INTRAVENOUS

## 2015-03-05 MED ORDER — LIDOCAINE HCL (CARDIAC) 20 MG/ML IV SOLN
INTRAVENOUS | Status: AC
  Filled 2015-03-05: qty 5

## 2015-03-05 MED ORDER — ROPIVACAINE HCL 5 MG/ML IJ SOLN
INTRAMUSCULAR | Status: AC
  Filled 2015-03-05: qty 60

## 2015-03-05 MED ORDER — ROCURONIUM BROMIDE 100 MG/10ML IV SOLN
INTRAVENOUS | Status: AC
  Filled 2015-03-05: qty 1

## 2015-03-05 MED ORDER — BUPIVACAINE LIPOSOME 1.3 % IJ SUSP
INTRAMUSCULAR | Status: DC | PRN
  Administered 2015-03-05: 30 mL

## 2015-03-05 MED ORDER — MIDAZOLAM HCL 2 MG/2ML IJ SOLN
INTRAMUSCULAR | Status: DC | PRN
  Administered 2015-03-05: 0.5 mg via INTRAVENOUS
  Administered 2015-03-05: 1.5 mg via INTRAVENOUS

## 2015-03-05 MED ORDER — ACETAMINOPHEN 160 MG/5ML PO SOLN
975.0000 mg | Freq: Once | ORAL | Status: AC
  Administered 2015-03-05: 975 mg via ORAL

## 2015-03-05 MED ORDER — NALOXONE HCL 0.4 MG/ML IJ SOLN: 0.4000 mg | INTRAMUSCULAR | Status: DC | PRN

## 2015-03-05 MED ORDER — PROPOFOL 10 MG/ML IV BOLUS
INTRAVENOUS | Status: DC | PRN
Start: 2015-03-05 — End: 2015-03-05
  Administered 2015-03-05: 200 mg via INTRAVENOUS

## 2015-03-05 MED ORDER — NEOSTIGMINE METHYLSULFATE 10 MG/10ML IV SOLN
INTRAVENOUS | Status: DC | PRN
  Administered 2015-03-05: 3 mg via INTRAVENOUS

## 2015-03-05 MED ORDER — SODIUM CHLORIDE 0.9 % IJ SOLN
INTRAMUSCULAR | Status: AC
  Filled 2015-03-05: qty 50

## 2015-03-05 MED ORDER — DEXAMETHASONE SODIUM PHOSPHATE 10 MG/ML IJ SOLN
INTRAMUSCULAR | Status: AC
  Filled 2015-03-05: qty 1

## 2015-03-05 MED ORDER — MIDAZOLAM HCL 2 MG/2ML IJ SOLN
INTRAMUSCULAR | Status: AC
  Filled 2015-03-05: qty 2

## 2015-03-05 MED ORDER — SODIUM CHLORIDE 0.9 % IV SOLN
INTRAVENOUS | Status: DC | PRN
  Administered 2015-03-05: 60 mL

## 2015-03-05 MED ORDER — GLYCOPYRROLATE 0.2 MG/ML IJ SOLN
INTRAMUSCULAR | Status: AC
Start: 2015-03-05 — End: 2015-03-05
  Filled 2015-03-05: qty 3

## 2015-03-05 MED ORDER — ROCURONIUM BROMIDE 100 MG/10ML IV SOLN
INTRAVENOUS | Status: DC | PRN
  Administered 2015-03-05: 10 mg via INTRAVENOUS
  Administered 2015-03-05: 50 mg via INTRAVENOUS

## 2015-03-05 MED ORDER — DEXTROSE IN LACTATED RINGERS 5 % IV SOLN
INTRAVENOUS | Status: DC
  Administered 2015-03-05 (×2): via INTRAVENOUS

## 2015-03-05 MED ORDER — TRAMADOL HCL 50 MG PO TABS
50.0000 mg | ORAL_TABLET | Freq: Four times a day (QID) | ORAL | Status: DC | PRN
  Administered 2015-03-06: 50 mg via ORAL
  Filled 2015-03-05: qty 1

## 2015-03-05 MED ORDER — ONDANSETRON HCL 4 MG/2ML IJ SOLN
INTRAMUSCULAR | Status: AC
  Filled 2015-03-05: qty 2

## 2015-03-05 MED ORDER — OXYCODONE-ACETAMINOPHEN 5-325 MG PO TABS
1.0000 | ORAL_TABLET | ORAL | Status: DC | PRN
  Administered 2015-03-06 (×2): 1 via ORAL
  Filled 2015-03-05 (×2): qty 1

## 2015-03-05 MED ORDER — HYDROMORPHONE 0.3 MG/ML IV SOLN
INTRAVENOUS | Status: DC
  Administered 2015-03-05: 0.9 mg via INTRAVENOUS
  Administered 2015-03-05: 16:00:00 via INTRAVENOUS
  Administered 2015-03-05: 1.19 mg via INTRAVENOUS
  Filled 2015-03-05: qty 25

## 2015-03-05 MED ORDER — FENTANYL CITRATE (PF) 250 MCG/5ML IJ SOLN
INTRAMUSCULAR | Status: AC
  Filled 2015-03-05: qty 5

## 2015-03-05 MED ORDER — METHYLENE BLUE 1 % INJ SOLN
INTRAMUSCULAR | Status: AC
  Filled 2015-03-05: qty 1

## 2015-03-05 MED ORDER — BUPIVACAINE HCL (PF) 0.25 % IJ SOLN
INTRAMUSCULAR | Status: AC
Start: 2015-03-05 — End: 2015-03-05
  Filled 2015-03-05: qty 10

## 2015-03-05 MED ORDER — SODIUM CHLORIDE 0.9 % IV SOLN: 40.0000 mL | Freq: Once | INTRAVENOUS | Status: DC

## 2015-03-05 MED ORDER — ACETAMINOPHEN 160 MG/5ML PO SOLN
ORAL | Status: AC
Start: 2015-03-05 — End: 2015-03-05
  Administered 2015-03-05: 975 mg via ORAL
  Filled 2015-03-05: qty 40.6

## 2015-03-05 MED ORDER — LIDOCAINE HCL (CARDIAC) 20 MG/ML IV SOLN
INTRAVENOUS | Status: DC | PRN
  Administered 2015-03-05: 80 mg via INTRAVENOUS

## 2015-03-05 MED ORDER — BUPIVACAINE LIPOSOME 1.3 % IJ SUSP
20.0000 mL | Freq: Once | INTRAMUSCULAR | Status: DC
  Filled 2015-03-05: qty 20

## 2015-03-05 MED ORDER — ONDANSETRON HCL 4 MG/2ML IJ SOLN
INTRAMUSCULAR | Status: DC | PRN
  Administered 2015-03-05 (×2): 2 mg via INTRAVENOUS

## 2015-03-05 MED ORDER — NEOSTIGMINE METHYLSULFATE 10 MG/10ML IV SOLN
INTRAVENOUS | Status: AC
  Filled 2015-03-05: qty 1

## 2015-03-05 MED ORDER — DIPHENHYDRAMINE HCL 12.5 MG/5ML PO ELIX: 12.5000 mg | ORAL_SOLUTION | Freq: Four times a day (QID) | ORAL | Status: DC | PRN

## 2015-03-05 MED ORDER — GLYCOPYRROLATE 0.2 MG/ML IJ SOLN
INTRAMUSCULAR | Status: DC | PRN
  Administered 2015-03-05 (×2): 0.1 mg via INTRAVENOUS
  Administered 2015-03-05: 0.4 mg via INTRAVENOUS

## 2015-03-05 MED ORDER — FENTANYL CITRATE (PF) 100 MCG/2ML IJ SOLN
INTRAMUSCULAR | Status: DC | PRN
  Administered 2015-03-05: 50 ug via INTRAVENOUS
  Administered 2015-03-05: 100 ug via INTRAVENOUS
  Administered 2015-03-05 (×4): 50 ug via INTRAVENOUS
  Administered 2015-03-05: 100 ug via INTRAVENOUS
  Administered 2015-03-05: 50 ug via INTRAVENOUS

## 2015-03-05 MED ORDER — SODIUM CHLORIDE 0.9 % IJ SOLN
INTRAMUSCULAR | Status: AC
  Filled 2015-03-05: qty 10

## 2015-03-05 MED ORDER — DIPHENHYDRAMINE HCL 50 MG/ML IJ SOLN: 12.5000 mg | Freq: Four times a day (QID) | INTRAMUSCULAR | Status: DC | PRN

## 2015-03-05 MED ORDER — CEFAZOLIN SODIUM-DEXTROSE 2-3 GM-% IV SOLR
INTRAVENOUS | Status: AC
  Filled 2015-03-05: qty 50

## 2015-03-05 MED ORDER — SODIUM CHLORIDE 0.9 % IJ SOLN: 9.0000 mL | INTRAMUSCULAR | Status: DC | PRN

## 2015-03-05 MED ORDER — FENTANYL CITRATE (PF) 100 MCG/2ML IJ SOLN: 25.0000 ug | INTRAMUSCULAR | Status: DC | PRN

## 2015-03-05 MED ORDER — ONDANSETRON HCL 4 MG/2ML IJ SOLN: 4.0000 mg | Freq: Four times a day (QID) | INTRAMUSCULAR | Status: DC | PRN

## 2015-03-05 MED ORDER — SCOPOLAMINE 1 MG/3DAYS TD PT72
1.0000 | MEDICATED_PATCH | Freq: Once | TRANSDERMAL | Status: AC
  Administered 2015-03-05: 1 via TRANSDERMAL
  Administered 2015-03-05: 1.5 mg via TRANSDERMAL

## 2015-03-05 MED ORDER — DEXAMETHASONE SODIUM PHOSPHATE 10 MG/ML IJ SOLN
INTRAMUSCULAR | Status: DC | PRN
  Administered 2015-03-05: 10 mg via INTRAVENOUS

## 2015-03-05 MED ORDER — KETOROLAC TROMETHAMINE 30 MG/ML IJ SOLN
INTRAMUSCULAR | Status: DC | PRN
  Administered 2015-03-05: 30 mg via INTRAVENOUS

## 2015-03-05 MED ORDER — STERILE WATER FOR IRRIGATION IR SOLN
Status: DC | PRN
  Administered 2015-03-05: 3000 mL

## 2015-03-05 MED ORDER — LACTATED RINGERS IV SOLN
INTRAVENOUS | Status: DC
  Administered 2015-03-05 (×2): via INTRAVENOUS

## 2015-03-05 MED ORDER — DEXAMETHASONE SODIUM PHOSPHATE 4 MG/ML IJ SOLN
INTRAMUSCULAR | Status: AC
  Filled 2015-03-05: qty 1

## 2015-03-05 MED ORDER — KETOROLAC TROMETHAMINE 30 MG/ML IJ SOLN
INTRAMUSCULAR | Status: AC
Start: 2015-03-05 — End: 2015-03-05
  Filled 2015-03-05: qty 1

## 2015-03-05 MED ORDER — PROPOFOL 10 MG/ML IV BOLUS
INTRAVENOUS | Status: AC
Start: 2015-03-05 — End: 2015-03-05
  Filled 2015-03-05: qty 20

## 2015-03-05 MED ORDER — SCOPOLAMINE 1 MG/3DAYS TD PT72
MEDICATED_PATCH | TRANSDERMAL | Status: DC
Start: 2015-03-05 — End: 2015-03-06
  Administered 2015-03-05: 1.5 mg via TRANSDERMAL
  Filled 2015-03-05: qty 1

## 2015-03-05 SURGICAL SUPPLY — 57 items
BARRIER ADHS 3X4 INTERCEED (GAUZE/BANDAGES/DRESSINGS) ×3 IMPLANT
BRR ADH 4X3 ABS CNTRL BYND (GAUZE/BANDAGES/DRESSINGS) ×1
CATH FOLEY 3WAY  5CC 16FR (CATHETERS) ×2
CATH FOLEY 3WAY 5CC 16FR (CATHETERS) ×1 IMPLANT
CLOTH BEACON ORANGE TIMEOUT ST (SAFETY) ×3 IMPLANT
CONT PATH 16OZ SNAP LID 3702 (MISCELLANEOUS) ×3 IMPLANT
COVER BACK TABLE 60X90IN (DRAPES) ×6 IMPLANT
COVER TIP SHEARS 8 DVNC (MISCELLANEOUS) ×1 IMPLANT
COVER TIP SHEARS 8MM DA VINCI (MISCELLANEOUS) ×2
DECANTER SPIKE VIAL GLASS SM (MISCELLANEOUS) ×12 IMPLANT
DRAPE WARM FLUID 44X44 (DRAPE) ×3 IMPLANT
DRSG COVADERM PLUS 2X2 (GAUZE/BANDAGES/DRESSINGS) ×12 IMPLANT
DRSG OPSITE POSTOP 3X4 (GAUZE/BANDAGES/DRESSINGS) ×3 IMPLANT
DURAPREP 26ML APPLICATOR (WOUND CARE) ×3 IMPLANT
ELECT REM PT RETURN 9FT ADLT (ELECTROSURGICAL) ×3
ELECTRODE REM PT RTRN 9FT ADLT (ELECTROSURGICAL) ×1 IMPLANT
GAUZE VASELINE 3X9 (GAUZE/BANDAGES/DRESSINGS) IMPLANT
GLOVE BIO SURGEON STRL SZ7.5 (GLOVE) ×6 IMPLANT
GLOVE BIOGEL PI IND STRL 7.0 (GLOVE) ×2 IMPLANT
GLOVE BIOGEL PI INDICATOR 7.0 (GLOVE) ×4
KIT ACCESSORY DA VINCI DISP (KITS) ×2
KIT ACCESSORY DVNC DISP (KITS) ×1 IMPLANT
LEGGING LITHOTOMY PAIR STRL (DRAPES) ×3 IMPLANT
LIQUID BAND (GAUZE/BANDAGES/DRESSINGS) ×3 IMPLANT
NEEDLE HYPO 22GX1.5 SAFETY (NEEDLE) ×3 IMPLANT
NEEDLE INSUFFLATION 150MM (ENDOMECHANICALS) ×3 IMPLANT
OCCLUDER COLPOPNEUMO (BALLOONS) ×2 IMPLANT
PACK ROBOT WH (CUSTOM PROCEDURE TRAY) ×3 IMPLANT
PACK ROBOTIC GOWN (GOWN DISPOSABLE) ×3 IMPLANT
PAD POSITIONER PINK NONSTERILE (MISCELLANEOUS) ×3 IMPLANT
PAD PREP 24X48 CUFFED NSTRL (MISCELLANEOUS) ×6 IMPLANT
SET CYSTO W/LG BORE CLAMP LF (SET/KITS/TRAYS/PACK) ×2 IMPLANT
SET IRRIG TUBING LAPAROSCOPIC (IRRIGATION / IRRIGATOR) ×3 IMPLANT
SET TRI-LUMEN FLTR TB AIRSEAL (TUBING) ×2 IMPLANT
SUT VIC AB 0 CT1 27 (SUTURE) ×6
SUT VIC AB 0 CT1 27XBRD ANBCTR (SUTURE) ×2 IMPLANT
SUT VICRYL 0 UR6 27IN ABS (SUTURE) ×3 IMPLANT
SUT VICRYL RAPIDE 4/0 PS 2 (SUTURE) ×6 IMPLANT
SUT VLOC 180 0 9IN  GS21 (SUTURE)
SUT VLOC 180 0 9IN GS21 (SUTURE) IMPLANT
SYR 50ML LL SCALE MARK (SYRINGE) ×3 IMPLANT
SYR CONTROL 10ML LL (SYRINGE) ×3 IMPLANT
SYRINGE 10CC LL (SYRINGE) ×3 IMPLANT
TIP RUMI ORANGE 6.7MMX12CM (TIP) IMPLANT
TIP UTERINE 5.1X6CM LAV DISP (MISCELLANEOUS) IMPLANT
TIP UTERINE 6.7X10CM GRN DISP (MISCELLANEOUS) ×2 IMPLANT
TIP UTERINE 6.7X6CM WHT DISP (MISCELLANEOUS) IMPLANT
TIP UTERINE 6.7X8CM BLUE DISP (MISCELLANEOUS) IMPLANT
TOWEL OR 17X24 6PK STRL BLUE (TOWEL DISPOSABLE) ×9 IMPLANT
TROCAR DISP BLADELESS 8 DVNC (TROCAR) ×1 IMPLANT
TROCAR DISP BLADELESS 8MM (TROCAR) ×2
TROCAR PORT AIRSEAL 5X120 (TROCAR) ×2 IMPLANT
TROCAR XCEL 12X100 BLDLESS (ENDOMECHANICALS) IMPLANT
TROCAR XCEL NON-BLD 5MMX100MML (ENDOMECHANICALS) ×2 IMPLANT
TROCAR Z-THREAD 12X150 (TROCAR) ×3 IMPLANT
WARMER LAPAROSCOPE (MISCELLANEOUS) ×3 IMPLANT
WATER STERILE IRR 1000ML POUR (IV SOLUTION) ×9 IMPLANT

## 2015-03-05 NOTE — Anesthesia Postprocedure Evaluation (Signed)
  Anesthesia Post-op Note  Patient: Joyce Jacobs  Procedure(s) Performed: Procedure(s) (LRB): ROBOTIC ASSISTED TOTAL HYSTERECTOMY WITH BILATERAL SALPINGO OOPHORECTOMY (Bilateral)  Patient Location: PACU  Anesthesia Type: General  Level of Consciousness: awake and alert   Airway and Oxygen Therapy: Patient Spontanous Breathing  Post-op Pain: mild  Post-op Assessment: Post-op Vital signs reviewed, Patient's Cardiovascular Status Stable, Respiratory Function Stable, Patent Airway and No signs of Nausea or vomiting  Last Vitals:  Filed Vitals:   03/05/15 1545  BP: 100/53  Pulse: 68  Temp:   Resp: 20    Post-op Vital Signs: stable   Complications: No apparent anesthesia complications

## 2015-03-05 NOTE — Anesthesia Preprocedure Evaluation (Addendum)
Anesthesia Evaluation  Patient identified by MRN, date of birth, ID band Patient awake    Reviewed: Allergy & Precautions, H&P , Patient's Chart, lab work & pertinent test results, reviewed documented beta blocker date and time   Airway Mallampati: II  TM Distance: >3 FB Neck ROM: full    Dental no notable dental hx.    Pulmonary  breath sounds clear to auscultation  Pulmonary exam normal       Cardiovascular Rhythm:regular Rate:Normal     Neuro/Psych    GI/Hepatic   Endo/Other    Renal/GU      Musculoskeletal   Abdominal   Peds  Hematology   Anesthesia Other Findings            Complication of anesthesia  nausea and vomiting Headache.....migraines   GERD (gastroesophageal reflux disease)   Allergy    Hyperlipidemia  Chronic sinusitis    Asthma... Distant and with         Reproductive/Obstetrics                            Anesthesia Physical Anesthesia Plan  ASA: II  Anesthesia Plan: General   Post-op Pain Management:    Induction: Intravenous  Airway Management Planned: Oral ETT  Additional Equipment:   Intra-op Plan:   Post-operative Plan: Extubation in OR  Informed Consent: I have reviewed the patients History and Physical, chart, labs and discussed the procedure including the risks, benefits and alternatives for the proposed anesthesia with the patient or authorized representative who has indicated his/her understanding and acceptance.   Dental Advisory Given and Dental advisory given  Plan Discussed with: CRNA and Surgeon  Anesthesia Plan Comments: (  Discussed general anesthesia, including possible nausea, instrumentation of airway, sore throat,pulmonary aspiration, etc. I asked if the were any outstanding questions, or  concerns before we proceeded. )        Anesthesia Quick Evaluation

## 2015-03-05 NOTE — Progress Notes (Signed)
Patient ID: Joyce Jacobs, female   DOB: 1961-08-25, 54 y.o.   MRN: [id number] Patient seen and examined. Consent witnessed and signed. No changes noted. Update completed. CBC    Component Value Date/Time   WBC 9.4 03/05/2015 1118   RBC 4.61 03/05/2015 1118   HGB 14.0 03/05/2015 1118   HGB 13.4 12/19/2014 1519   HCT 42.4 03/05/2015 1118   PLT 237 03/05/2015 1118   MCV 92.0 03/05/2015 1118   MCH 30.4 03/05/2015 1118   MCHC 33.0 03/05/2015 1118   RDW 13.9 03/05/2015 1118   LYMPHSABS 1.8 06/14/2014 0702   MONOABS 0.5 06/14/2014 0702   EOSABS 0.2 06/14/2014 0702   BASOSABS 0.1 06/14/2014 2493

## 2015-03-05 NOTE — Anesthesia Procedure Notes (Signed)
Procedure Name: Intubation Date/Time: 03/05/2015 12:34 PM Performed by: Mayer Camel, Dyamon Sosinski A Pre-anesthesia Checklist: Patient identified, Emergency Drugs available, Suction available and Patient being monitored Patient Re-evaluated:Patient Re-evaluated prior to inductionOxygen Delivery Method: Circle system utilized Preoxygenation: Pre-oxygenation with 100% oxygen Intubation Type: IV induction Ventilation: Mask ventilation without difficulty Laryngoscope Size: Miller and 2 Grade View: Grade II Tube type: Oral Tube size: 7.0 mm Number of attempts: 1 Placement Confirmation: ETT inserted through vocal cords under direct vision,  breath sounds checked- equal and bilateral and positive ETCO2 Secured at: 20 cm Tube secured with: Tape Dental Injury: Teeth and Oropharynx as per pre-operative assessment

## 2015-03-05 NOTE — Anesthesia Postprocedure Evaluation (Signed)
  Anesthesia Post-op Note  Patient: Joyce Jacobs  Procedure(s) Performed: Procedure(s): ROBOTIC ASSISTED TOTAL HYSTERECTOMY WITH BILATERAL SALPINGO OOPHORECTOMY (Bilateral)  Patient Location: PACU and Women's Unit  Anesthesia Type:General  Level of Consciousness: awake, alert , oriented and patient cooperative  Airway and Oxygen Therapy: Patient Spontanous Breathing and Patient connected to nasal cannula oxygen  Post-op Pain: none  Post-op Assessment: Post-op Vital signs reviewed, Patient's Cardiovascular Status Stable, Respiratory Function Stable, Patent Airway, No signs of Nausea or vomiting, Adequate PO intake and Pain level controlled              Post-op Vital Signs: Reviewed and stable  Last Vitals:  Filed Vitals:   03/05/15 1713  BP: 101/53  Pulse: 86  Temp:   Resp: 16    Complications: No apparent anesthesia complications

## 2015-03-05 NOTE — Transfer of Care (Signed)
Immediate Anesthesia Transfer of Care Note  Patient: Joyce Jacobs  Procedure(s) Performed: Procedure(s): ROBOTIC ASSISTED TOTAL HYSTERECTOMY WITH BILATERAL SALPINGO OOPHORECTOMY (Bilateral)  Patient Location: PACU  Anesthesia Type:General  Level of Consciousness: awake, alert  and oriented  Airway & Oxygen Therapy: Patient Spontanous Breathing and Patient connected to nasal cannula oxygen  Post-op Assessment: Report given to RN, Post -op Vital signs reviewed and stable and Patient moving all extremities X 4  Post vital signs: Reviewed and stable  Last Vitals:  Filed Vitals:   03/05/15 1127  Pulse: 69  Temp: 36.9 C  Resp: 18    Complications: No apparent anesthesia complications

## 2015-03-05 NOTE — Op Note (Signed)
03/05/2015  2:25 PM  PATIENT:  Joyce Jacobs  54 y.o. female  PRE-OPERATIVE DIAGNOSIS:  Abnormal Uterine Bleeding, Failed Endometrial Ablation  POST-OPERATIVE DIAGNOSIS:  Abnormal Uterine Bleeding Failed Endometrial Ablation Pelvic Adhesions Enterocele   PROCEDURE:  Procedure(s): ROBOTIC ASSISTED TOTAL HYSTERECTOMY BILATERAL SALPINGO OOPHORECTOMY LYSIS OF ADHESIONS MCCALL CUL DE PLASTY  SURGEON:  Surgeon(s): Brien Few, MD  ASSISTANTSDrake Leach, CNM   ANESTHESIA:   local and general  ESTIMATED BLOOD LOSS: * No blood loss amount entered *   DRAINS: Urinary Catheter (Foley)   LOCAL MEDICATIONS USED:  MARCAINE    and Amount: 30 ml  SPECIMEN:  Source of Specimen:  uterus , cervix, tubes and ovaries  DISPOSITION OF SPECIMEN:  PATHOLOGY  COUNTS:  YES  DICTATION #: [id number]  PLAN OF CARE: DC home in am- 23 hr obs

## 2015-03-05 NOTE — Addendum Note (Signed)
Addendum  created 03/05/15 1728 by Alvy Bimler, CRNA   Modules edited: Notes Section   Notes Section:  File: [id number]

## 2015-03-06 ENCOUNTER — Encounter (HOSPITAL_COMMUNITY): Payer: Self-pay | Admitting: Obstetrics and Gynecology

## 2015-03-06 DIAGNOSIS — N838 Other noninflammatory disorders of ovary, fallopian tube and broad ligament: Secondary | ICD-10-CM | POA: Diagnosis not present

## 2015-03-06 LAB — BASIC METABOLIC PANEL
Anion gap: 4 — ABNORMAL LOW (ref 5–15)
BUN: 9 mg/dL (ref 6–20)
CO2: 24 mmol/L (ref 22–32)
CREATININE: 0.8 mg/dL (ref 0.44–1.00)
Calcium: 7.9 mg/dL — ABNORMAL LOW (ref 8.9–10.3)
Chloride: 106 mmol/L (ref 101–111)
GFR calc Af Amer: >60 mL/min (ref >60)
GFR calc non Af Amer: >60 mL/min (ref >60)
GLUCOSE: 133 mg/dL — AB (ref 65–99)
Potassium: 4 mmol/L (ref 3.5–5.1)
SODIUM: 134 mmol/L — AB (ref 135–145)

## 2015-03-06 LAB — CBC
HCT: 35.8 % — ABNORMAL LOW (ref 36.0–46.0)
Hemoglobin: 11.7 g/dL — ABNORMAL LOW (ref 12.0–15.0)
MCH: 30 pg (ref 26.0–34.0)
MCHC: 32.7 g/dL (ref 30.0–36.0)
MCV: 91.8 fL (ref 78.0–100.0)
PLATELETS: 203 10*3/uL (ref 150–400)
RBC: 3.9 MIL/uL (ref 3.87–5.11)
RDW: 13.7 % (ref 11.5–15.5)
WBC: 14.2 10*3/uL — ABNORMAL HIGH (ref 4.0–10.5)

## 2015-03-06 MED ORDER — OXYCODONE-ACETAMINOPHEN 5-325 MG PO TABS: 1.0000 | ORAL_TABLET | ORAL | Status: DC | PRN

## 2015-03-06 MED ORDER — TRAMADOL HCL 50 MG PO TABS: 50.0000 mg | ORAL_TABLET | Freq: Four times a day (QID) | ORAL | Status: DC | PRN

## 2015-03-06 NOTE — Progress Notes (Signed)
Patient discharged home with son... Discharge instructions reviewed with patient and she verbalized understanding... Condition stable... No equipment... Taken to car via wheelchair by Santiago Bur, NT.

## 2015-03-06 NOTE — Progress Notes (Signed)
1 Day Post-Op Procedure(s) (LRB): ROBOTIC ASSISTED TOTAL HYSTERECTOMY WITH BILATERAL SALPINGO OOPHORECTOMY (Bilateral)  Subjective: Patient reports nausea, incisional pain, tolerating PO, + flatus and no problems voiding.    Objective: I have reviewed patient's vital signs, intake and output, medications and labs. Patient Vitals for the past 24 hrs:  BP Temp Temp src Pulse Resp SpO2  03/06/15 0627 (!) 89/51 mmHg 98.4 F (36.9 C) Oral 60 16 99 %  03/06/15 0108 (!) 104/50 mmHg 97.7 F (36.5 C) Oral 72 16 97 %  03/05/15 2126 (!) 105/45 mmHg 97.7 F (36.5 C) Oral 68 16 98 %  03/05/15 1934 (!) 100/55 mmHg 97.9 F (36.6 C) Axillary 67 17 99 %  03/05/15 1829 (!) 97/56 mmHg 98.1 F (36.7 C) Axillary (!) 58 15 96 %  03/05/15 1732 - - - - 14 98 %  03/05/15 1713 (!) 101/53 mmHg - - 86 16 95 %  03/05/15 1629 (!) 101/57 mmHg 97.8 F (36.6 C) Oral 72 15 98 %  03/05/15 1622 94/60 mmHg 97.6 F (36.4 C) - 64 20 100 %  03/05/15 1600 (!) 94/48 mmHg 97.5 F (36.4 C) - (!) 52 19 100 %  03/05/15 1545 (!) 100/53 mmHg - - 68 20 100 %  03/05/15 1530 (!) 88/48 mmHg - - (!) 52 18 100 %  03/05/15 1515 (!) 91/53 mmHg - - 68 16 100 %  03/05/15 1500 (!) 78/46 mmHg - - (!) 53 (!) 22 100 %  03/05/15 1445 (!) 83/48 mmHg - - (!) 59 19 100 %  03/05/15 1432 (!) 82/46 mmHg 98.3 F (36.8 C) - 75 (!) 8 99 %  03/05/15 1127 - 98.4 F (36.9 C) Oral 69 18 100 %   CBC    Component Value Date/Time   WBC 14.2* 03/06/2015 0530   RBC 3.90 03/06/2015 0530   HGB 11.7* 03/06/2015 0530   HGB 13.4 12/19/2014 1519   HCT 35.8* 03/06/2015 0530   PLT 203 03/06/2015 0530   MCV 91.8 03/06/2015 0530   MCH 30.0 03/06/2015 0530   MCHC 32.7 03/06/2015 0530   RDW 13.7 03/06/2015 0530   LYMPHSABS 1.8 06/14/2014 0702   MONOABS 0.5 06/14/2014 0702   EOSABS 0.2 06/14/2014 0702   BASOSABS 0.1 06/14/2014 2703      General: alert, cooperative and appears stated age Resp: clear to auscultation bilaterally and normal percussion  bilaterally Cardio: regular rate and rhythm, S1, S2 normal, no murmur, click, rub or gallop and normal apical impulse GI: soft, non-tender; bowel sounds normal; no masses,  no organomegaly, normal findings: aorta normal and bowel sounds normal and incision: clean, dry and intact Extremities: extremities normal, atraumatic, no cyanosis or edema and Homans sign is negative, no sign of DVT Vaginal Bleeding: none and minimal  Assessment: s/p Procedure(s): ROBOTIC ASSISTED TOTAL HYSTERECTOMY WITH BILATERAL SALPINGO OOPHORECTOMY (Bilateral): stable, progressing well and tolerating diet  Plan: Advance diet Encourage ambulation Advance to PO medication Discontinue IV fluids Discharge home     Jennett Tarbell J 03/06/2015, 7:04 AM

## 2015-03-06 NOTE — Op Note (Signed)
NAMELUCKY, THAKKER           ACCOUNT NO.:  [id number]  MEDICAL RECORD NO.:  [id number]  LOCATION:  9317                          FACILITY:  [facility]  PHYSICIAN:  Lenoard Aden, M.D.DATE OF BIRTH:  11-Jun-1961  DATE OF PROCEDURE:  03/05/2015 DATE OF DISCHARGE:                              OPERATIVE REPORT   PREOPERATIVE DIAGNOSES: 1. Abnormal uterine bleeding. 2. Symptomatic fibroids. 3. Failed endometrial ablation.  POSTOPERATIVE DIAGNOSES: 1. Abnormal uterine bleeding. 2. Symptomatic fibroids. 3. Failed endometrial ablation. 4. Enterocele. 5. Pelvic adhesions.  PROCEDURES: 1. Da Vinci-assisted total laparoscopic hysterectomy. 2. Bilateral salpingectomy. 3. Lysis of adhesions. 4. McCall culdoplasty.  SURGEON:  Lenoard Aden, M.D.  ASSISTANTDenyse Amass, CNM.  ESTIMATED BLOOD LOSS:  Less than 50 mL.  COMPLICATIONS:  None.  DRAINS:  Foley.  COUNTS:  Correct.  DISPOSITION:  The patient to recovery in good condition.  BRIEF OPERATIVE NOTE:  After being apprised of risks of anesthesia, infection, bleeding, injury to surrounding organs, possible need for repair, delayed versus immediate complications to include bowel and bladder injury, possible need for repair, the patient was brought to the operating room where she was administered a general anesthetic without complications.  Prepped and draped in usual sterile fashion.  Foley catheter placed.  RUMI retractor was placed vaginally without difficulty.  Exam under anesthesia revealed a bulky anteflexed uterus 8 to 10 week size.  No adnexal masses.  Infraumbilical incision was made with a scalpel.  Veress needle placed, opening pressure of -2, 3 L CO2 insufflated without difficulty.  Atraumatic trocar placement done with a 15-mm trocar, pictures taken.  Visualization revealed adhesions of the left adnexa to the left sigmoid colon.  Some anterior cul-de-sac adhesions due to previous C-sections.  Normal right  ovary, normal left ovary, normal posterior cul-de-sac.  Two 8 mm sites were placed on the right and 2 on the left at this time.  Ports were placed without difficulty after achieving deep Trendelenburg position.  Robotic instruments were placed and robot was docked in a standard fashion.  PK forceps, ProGrasp, and Endoshears were placed.  At this time, the left adnexa was cleared of any adhesion sharply using sharp dissection. Ureter was identified on the left.  The left retroperitoneal space was entered.  The ureter was skeletonized and a window was made skeletonizing the left infundibulopelvic ligament, which was cauterized in a 3-point method and divided.  The posterior leaf of the broad ligament was developed pushing the ureter distal in the process.  The round ligament was grasped and ligated.  The bladder flap was sharply developed anterior left side lysing some dense adhesions from previous C- sections.  The left uterine vessels skeletonized, cauterized in a 2- point bipolar method, but not cut.  On the right side, similarly the retroperitoneal space was entered, the ureter was identified, and pushed distally, a window was created skeletonizing the right infundibulopelvic ligament, which was cauterized in a two-point method and divided.  The posterior leaf of the broad ligament was further divided down to the level of the uterine vessels pushing the ureter distally.  Round ligaments were grasped and ligated.  The right uterine vessel was skeletonized after further development of bladder flap  and sharp dissection.  The right uterine vessels were cauterized in a 2-point bipolar method and divided.  The left uterine vessels were then divided. The RUMI cup was visualized bulging at the cervicovaginal junction. This was divided using sharp dissection.  The specimen was then bivalved and removed vaginally.  The cuff was closed using 0 V-Loc suture in a continuous running fashion.  Second  imbricating layer was placed and McCall culdoplasty suture with plication of the uterosacral ligaments was done.  The ureters were bilaterally seen pulsating normally.  The urine was clear and copious.  There was no evidence of any further vaginal bleeding.  At this time, CO2 was released.  All instruments were removed.  The robot was undocked.  The incisions were closed using 0- Vicryl, 4-0 Vicryl, and Dermabond.  Ropivacaine has been placed before removal of the trocars, Exparel was placed on the incisions.  The patient tolerated the procedure well, was awakened, and transferred to recovery in good condition.     Lenoard Aden, M.D.     RJT/MEDQ  D:  03/05/2015  T:  03/06/2015  Job:  [id number]

## 2015-05-04 ENCOUNTER — Other Ambulatory Visit: Payer: Self-pay | Admitting: *Deleted

## 2015-05-04 ENCOUNTER — Other Ambulatory Visit: Payer: Self-pay | Admitting: Family Medicine

## 2015-05-04 NOTE — Progress Notes (Signed)
May have this +6 refills 

## 2015-05-07 MED ORDER — BACLOFEN 10 MG PO TABS: ORAL_TABLET | ORAL | Status: DC

## 2015-05-16 ENCOUNTER — Other Ambulatory Visit: Payer: Self-pay | Admitting: Family Medicine

## 2015-06-07 ENCOUNTER — Other Ambulatory Visit: Payer: Self-pay | Admitting: Family Medicine

## 2015-06-18 ENCOUNTER — Ambulatory Visit (INDEPENDENT_AMBULATORY_CARE_PROVIDER_SITE_OTHER): Payer: BC Managed Care – PPO | Admitting: Family Medicine

## 2015-06-18 ENCOUNTER — Encounter: Payer: Self-pay | Admitting: Family Medicine

## 2015-06-18 VITALS — BP 124/78 | Ht 63.0 in | Wt 159.2 lb

## 2015-06-18 DIAGNOSIS — Z79899 Other long term (current) drug therapy: Secondary | ICD-10-CM | POA: Diagnosis not present

## 2015-06-18 DIAGNOSIS — G43001 Migraine without aura, not intractable, with status migrainosus: Secondary | ICD-10-CM | POA: Diagnosis not present

## 2015-06-18 DIAGNOSIS — E785 Hyperlipidemia, unspecified: Secondary | ICD-10-CM

## 2015-06-18 MED ORDER — SUMATRIPTAN SUCCINATE 100 MG PO TABS: ORAL_TABLET | ORAL | Status: DC

## 2015-06-18 MED ORDER — RANITIDINE HCL 300 MG PO TABS: 300.0000 mg | ORAL_TABLET | Freq: Every day | ORAL | Status: DC

## 2015-06-18 MED ORDER — DIVALPROEX SODIUM 250 MG PO DR TAB: 250.0000 mg | DELAYED_RELEASE_TABLET | Freq: Two times a day (BID) | ORAL | Status: DC

## 2015-06-18 NOTE — Progress Notes (Signed)
Subjective:    Patient ID: Joyce Jacobs, female    DOB: 06/29/1961, 54 y.o.   MRN: [id number]  Migraine  This is a recurrent problem. The current episode started more than 1 year ago. The problem occurs daily. The problem has been gradually worsening. The pain quality is similar to prior headaches. The quality of the pain is described as dull. The symptoms are aggravated by hunger, fatigue and bright light.  patient having more frequent headaches. She relates the headaches are to the point really bothering her at work. She is interested in doing something with her regimen. She does state that the medicine she takes for the headache the triptans seem to help  Patient states no other concerns this visit.  Review of Systems She does relate some blurred vision some nausea with the headache she relates a headache and back of the head and the neck area in the back of the head does not wake her up at night no double vision.    Objective:   Physical Exam  Neurological grossly normal subjective discomfort on the back of her head lungs are clear no crackles heart regular pulse normal extremities no edema skin warm dry      Assessment & Plan:  migraines-increase Depakote 250 mg twice a day report back to [place] over the next couple weeks how the headaches are doing consider referral to neurology if headaches continue to worsen currently right now I don't think they will have anything else to add  I do recommend lipid profile await the results. Patient currently not taking statin.

## 2015-08-14 ENCOUNTER — Ambulatory Visit (INDEPENDENT_AMBULATORY_CARE_PROVIDER_SITE_OTHER): Payer: BC Managed Care – PPO | Admitting: Family Medicine

## 2015-08-14 ENCOUNTER — Encounter: Payer: Self-pay | Admitting: Family Medicine

## 2015-08-14 VITALS — BP 126/76 | Temp 98.5°F | Ht 63.0 in | Wt 160.1 lb

## 2015-08-14 DIAGNOSIS — J019 Acute sinusitis, unspecified: Secondary | ICD-10-CM

## 2015-08-14 DIAGNOSIS — B9689 Other specified bacterial agents as the cause of diseases classified elsewhere: Secondary | ICD-10-CM

## 2015-08-14 MED ORDER — FLUCONAZOLE 150 MG PO TABS: 150.0000 mg | ORAL_TABLET | Freq: Once | ORAL | Status: DC

## 2015-08-14 MED ORDER — ALBUTEROL SULFATE HFA 108 (90 BASE) MCG/ACT IN AERS: 2.0000 | INHALATION_SPRAY | Freq: Four times a day (QID) | RESPIRATORY_TRACT | Status: DC | PRN

## 2015-08-14 MED ORDER — AMOXICILLIN-POT CLAVULANATE 875-125 MG PO TABS: 1.0000 | ORAL_TABLET | Freq: Two times a day (BID) | ORAL | Status: DC

## 2015-08-14 NOTE — Progress Notes (Signed)
Subjective:    Patient ID: Joyce Jacobs, female    DOB: [birth year], 55 y.o.   MRN: [id number]  Sinusitis This is a new problem. The current episode started in the past 7 days. The problem has been gradually worsening since onset. Associated symptoms include congestion, coughing, ear pain, headaches and sinus pressure. Pertinent negatives include no shortness of breath. Treatments tried: Sinus rinse, Nyquil, Mucinex, naproxen. The treatment provided mild relief.   Patient states no other concerns this visit.  PMH benign  Review of Systems  Constitutional: Negative for fever and activity change.  HENT: Positive for congestion, ear pain, rhinorrhea and sinus pressure.   Eyes: Negative for discharge.  Respiratory: Positive for cough. Negative for shortness of breath and wheezing.   Cardiovascular: Negative for chest pain.  Neurological: Positive for headaches.       Objective:   Physical Exam  Constitutional: She appears well-developed.  HENT:  Head: Normocephalic.  Nose: Nose normal.  Mouth/Throat: Oropharynx is clear and moist. No oropharyngeal exudate.  Neck: Neck supple.  Cardiovascular: Normal rate and normal heart sounds.   No murmur heard. Pulmonary/Chest: Effort normal and breath sounds normal. She has no wheezes.  Lymphadenopathy:    She has no cervical adenopathy.  Skin: Skin is warm and dry.  Nursing note and vitals reviewed.         Assessment & Plan:   patient relates her migraines are under good control currently regimen  she is trying eat healthy Antibiotics prescribed for sinus infection Follow-up if progressive troubles

## 2015-09-10 ENCOUNTER — Encounter: Payer: Self-pay | Admitting: Family Medicine

## 2015-09-10 ENCOUNTER — Ambulatory Visit (INDEPENDENT_AMBULATORY_CARE_PROVIDER_SITE_OTHER): Payer: BC Managed Care – PPO | Admitting: Family Medicine

## 2015-09-10 VITALS — BP 122/74 | Temp 98.2°F | Ht 63.0 in | Wt 158.1 lb

## 2015-09-10 DIAGNOSIS — J189 Pneumonia, unspecified organism: Secondary | ICD-10-CM | POA: Diagnosis not present

## 2015-09-10 DIAGNOSIS — J200 Acute bronchitis due to Mycoplasma pneumoniae: Secondary | ICD-10-CM | POA: Diagnosis not present

## 2015-09-10 MED ORDER — CLARITHROMYCIN 500 MG PO TABS: 500.0000 mg | ORAL_TABLET | Freq: Two times a day (BID) | ORAL | Status: DC

## 2015-09-10 NOTE — Progress Notes (Signed)
Subjective:    Patient ID: Joyce Jacobs, female    DOB: July 03, 1961, 55 y.o.   MRN: [id number]  Cough This is a new problem. The current episode started in the past 7 days. The problem occurs every few minutes. The cough is productive of sputum. Associated symptoms include ear pain, headaches and wheezing. She has tried steroid inhaler (Inhaler, Mucinex, robitussin ) for the symptoms. The treatment provided no relief.   Patient states no other concerns this visit.  PMH benign. Recent sinusitis but got over this. Relates a lot of coughing congestion intermittent wheezing denies shortness of breath denies high fever chills or sweats  Review of Systems  HENT: Positive for ear pain.   Respiratory: Positive for cough and wheezing.   Neurological: Positive for headaches.       Objective:   Physical Exam   sinus nontender eardrums normal throat is normal neck no masses crackles noted in the right base not respiratory distress heart regular      Assessment & Plan:   viral syndrome Secondary community-acquired pneumonia Antibiotics prescribed Warning signs discussed follow-up if problems   no need for x-rays or lab work currently patient will call back if worsens.

## 2015-09-11 ENCOUNTER — Encounter: Payer: Self-pay | Admitting: Family Medicine

## 2015-09-11 MED ORDER — FLUCONAZOLE 150 MG PO TABS: 150.0000 mg | ORAL_TABLET | Freq: Once | ORAL | Status: DC

## 2015-11-15 ENCOUNTER — Encounter: Payer: Self-pay | Admitting: Family Medicine

## 2015-11-16 ENCOUNTER — Other Ambulatory Visit: Payer: Self-pay | Admitting: *Deleted

## 2015-11-16 ENCOUNTER — Encounter: Payer: Self-pay | Admitting: Family Medicine

## 2015-11-16 MED ORDER — SUMATRIPTAN SUCCINATE 100 MG PO TABS: ORAL_TABLET | ORAL | Status: DC

## 2015-12-30 IMAGING — MG MM DIGITAL SCREENING
5 series · 5 of 5 positions shown · non-contrast
Comparison: 04/11/2013

CLINICAL DATA: Screening.

EXAM:
DIGITAL SCREENING BILATERAL MAMMOGRAM WITH CAD

[L CC]
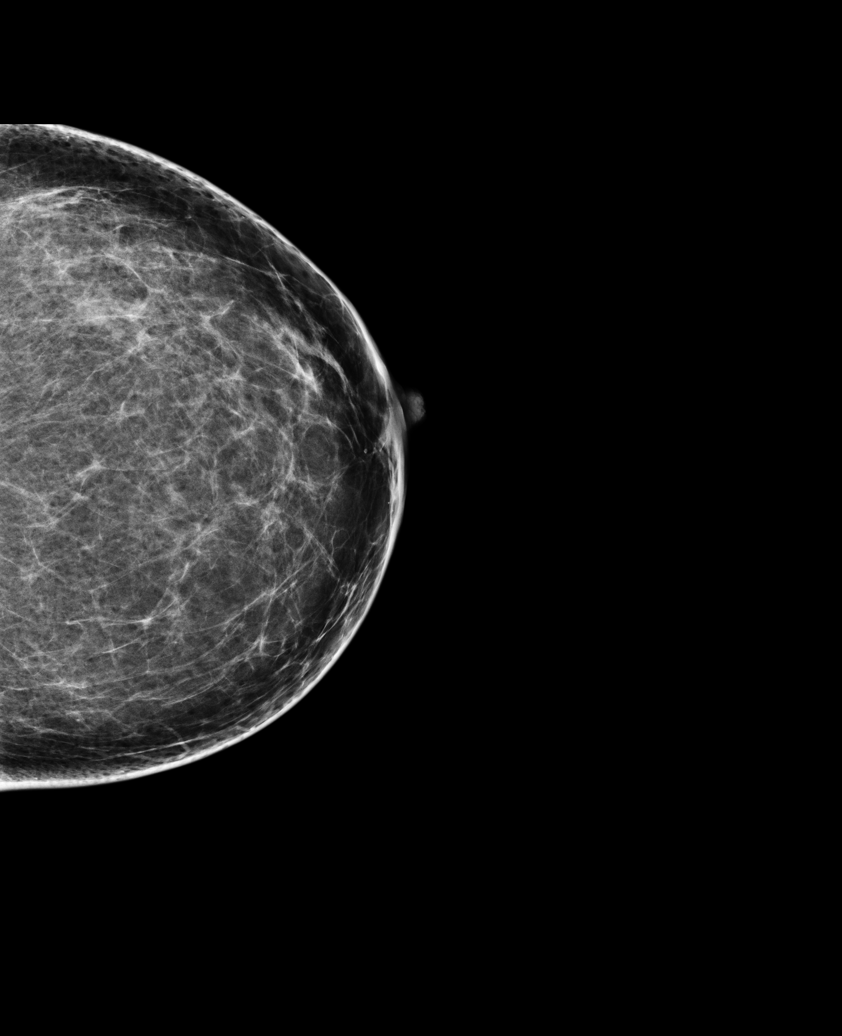

[L MLO (1 of 2)]
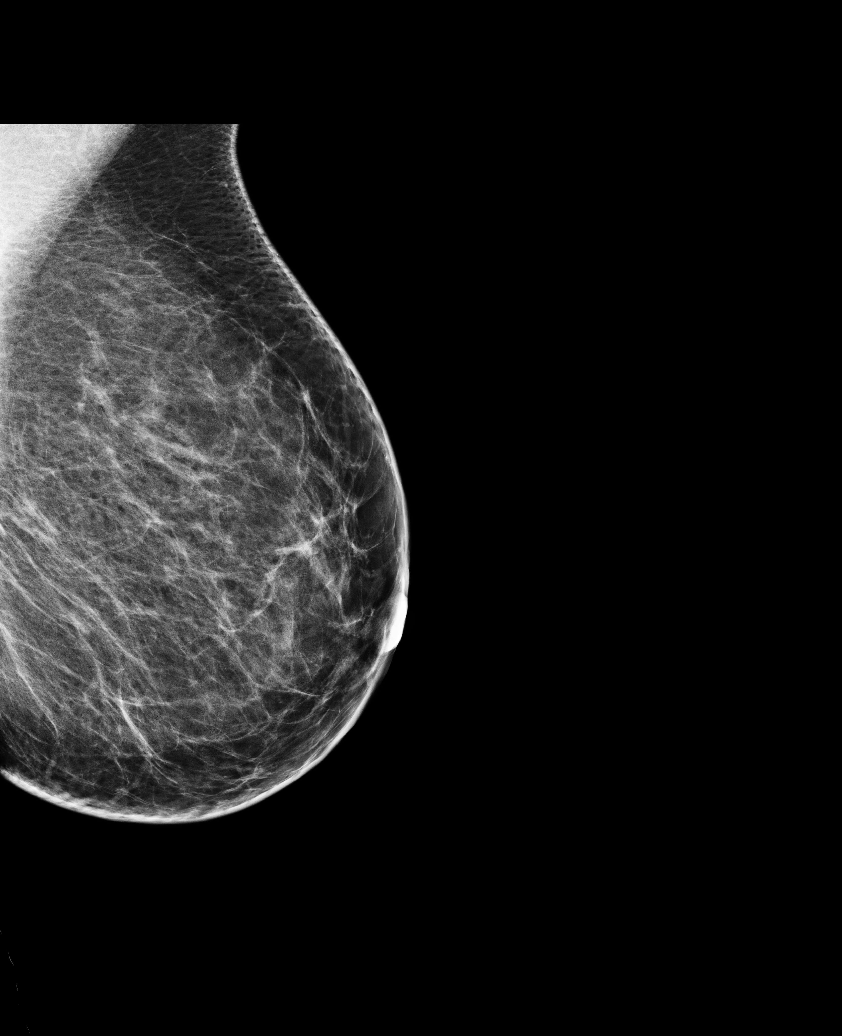

[R CC]
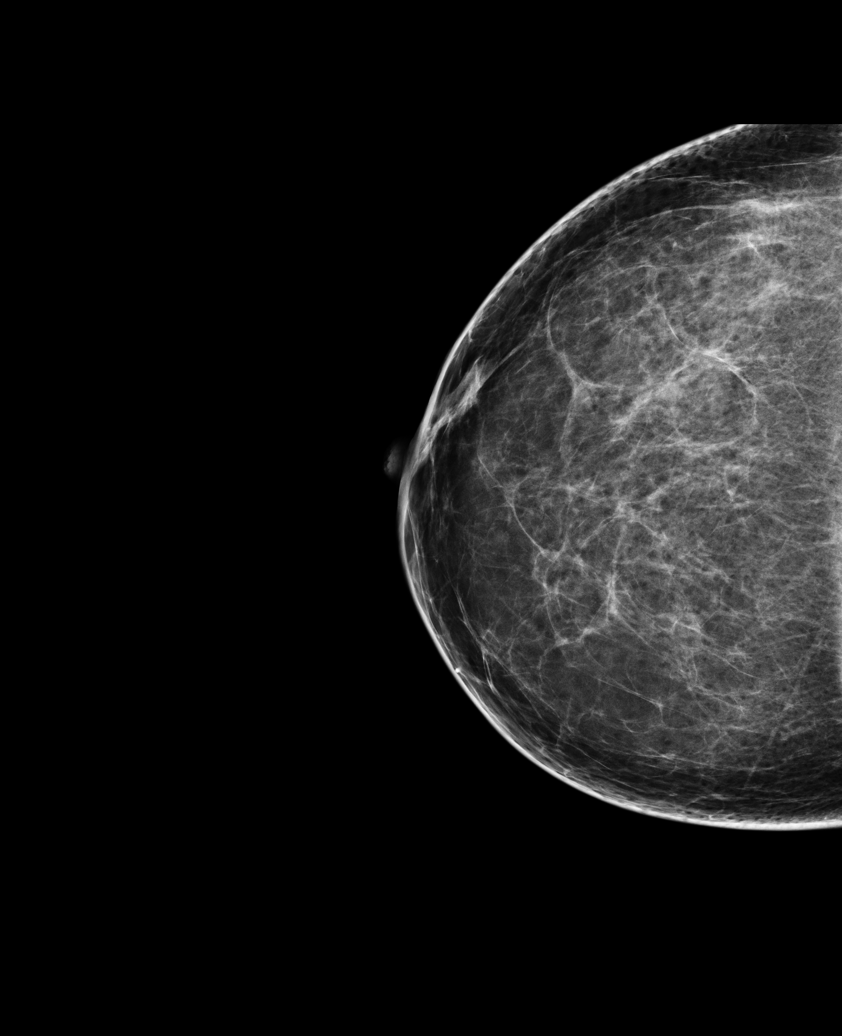

[R MLO]
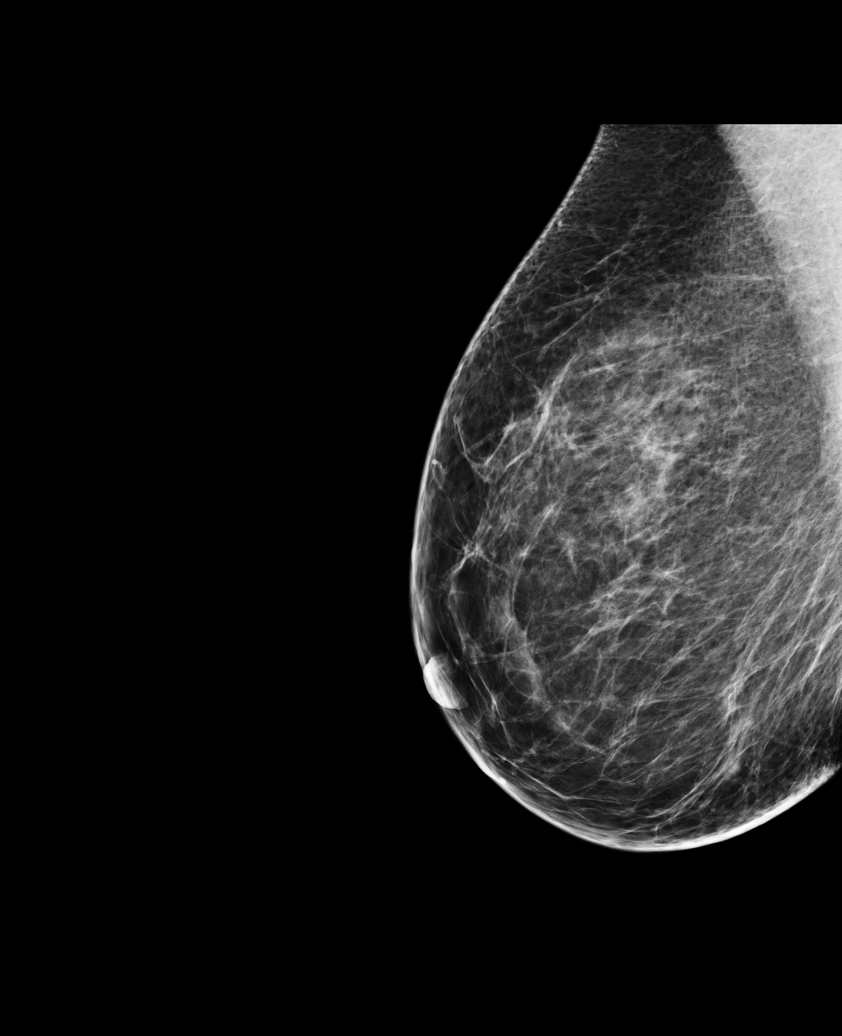

[L MLO (2 of 2)]
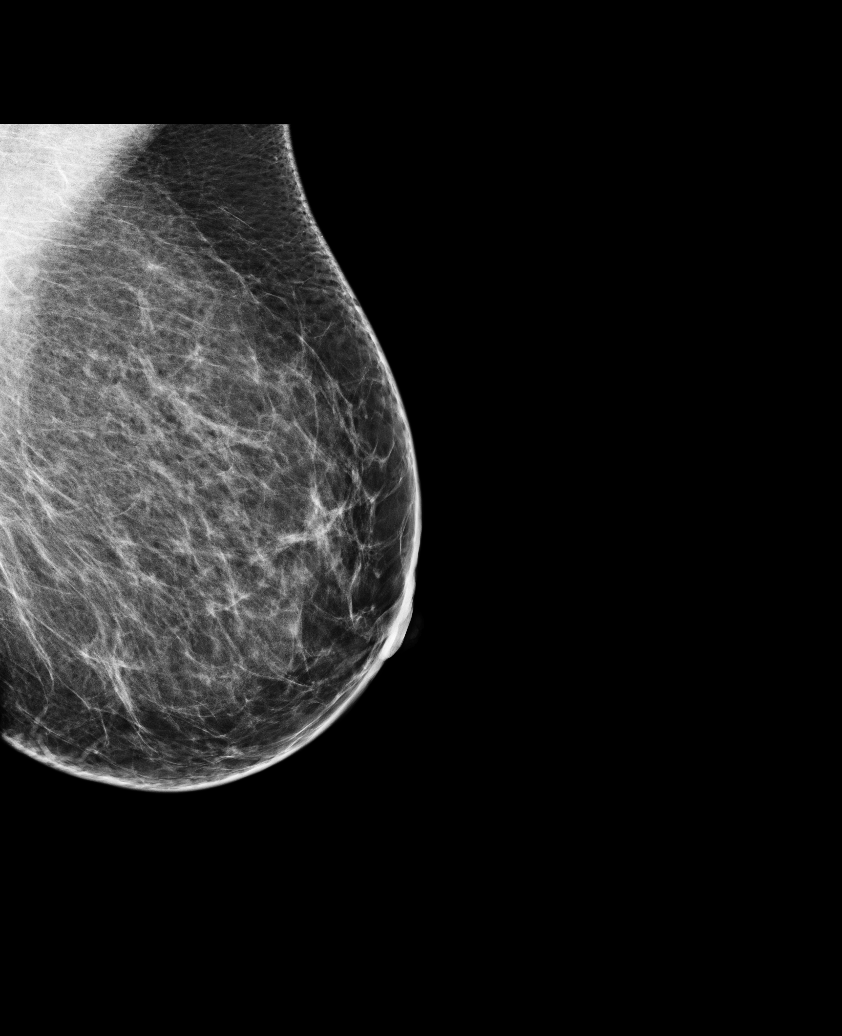

[5 of 5 positions shown; findings below may reference images not displayed]

ACR Breast Density Category b: There are scattered areas of
fibroglandular density.
FINDINGS: There are no findings suspicious for malignancy. Images were
processed with CAD.
IMPRESSION: No mammographic evidence of malignancy. A result letter of this
screening mammogram will be mailed directly to the patient.

RECOMMENDATION:
Screening mammogram in one year. (Code:J9-S-O0A)

BI-RADS CATEGORY  1: Negative.

## 2016-02-15 ENCOUNTER — Other Ambulatory Visit: Payer: Self-pay | Admitting: Family Medicine

## 2016-02-15 LAB — CBC WITH DIFFERENTIAL/PLATELET
Basophils Absolute: 48 cells/uL (ref 0–200)
Basophils Relative: 1 %
EOS ABS: 192 {cells}/uL (ref 15–500)
Eosinophils Relative: 4 %
HCT: 40.7 % (ref 35.0–45.0)
HEMOGLOBIN: 13.3 g/dL (ref 11.7–15.5)
Lymphocytes Relative: 43 %
Lymphs Abs: 2064 cells/uL (ref 850–3900)
MCH: 29.6 pg (ref 27.0–33.0)
MCHC: 32.7 g/dL (ref 32.0–36.0)
MCV: 90.4 fL (ref 80.0–100.0)
MPV: 10.4 fL (ref 7.5–12.5)
Monocytes Absolute: 384 cells/uL (ref 200–950)
Monocytes Relative: 8 %
NEUTROS ABS: 2112 {cells}/uL (ref 1500–7800)
NEUTROS PCT: 44 %
Platelets: 208 10*3/uL (ref 140–400)
RBC: 4.5 MIL/uL (ref 3.80–5.10)
RDW: 13.2 % (ref 11.0–15.0)
WBC: 4.8 10*3/uL (ref 3.8–10.8)

## 2016-02-15 LAB — LIPID PANEL
CHOL/HDL RATIO: 5.9 ratio — AB (ref <=5.0)
Cholesterol: 300 mg/dL — ABNORMAL HIGH (ref 125–200)
HDL: 51 mg/dL (ref >=46)
LDL Cholesterol: 202 mg/dL — ABNORMAL HIGH (ref <130)
Triglycerides: 236 mg/dL — ABNORMAL HIGH (ref <150)
VLDL: 47 mg/dL — AB (ref <30)

## 2016-03-06 ENCOUNTER — Telehealth: Payer: Self-pay | Admitting: Family Medicine

## 2016-03-06 ENCOUNTER — Encounter: Payer: Self-pay | Admitting: Family Medicine

## 2016-03-06 ENCOUNTER — Ambulatory Visit (INDEPENDENT_AMBULATORY_CARE_PROVIDER_SITE_OTHER): Payer: BC Managed Care – PPO | Admitting: Family Medicine

## 2016-03-06 VITALS — BP 108/58 | Ht 63.0 in | Wt 158.4 lb

## 2016-03-06 DIAGNOSIS — G43009 Migraine without aura, not intractable, without status migrainosus: Secondary | ICD-10-CM

## 2016-03-06 DIAGNOSIS — Z79899 Other long term (current) drug therapy: Secondary | ICD-10-CM

## 2016-03-06 DIAGNOSIS — E785 Hyperlipidemia, unspecified: Secondary | ICD-10-CM

## 2016-03-06 MED ORDER — DIVALPROEX SODIUM 250 MG PO DR TAB: 250.0000 mg | DELAYED_RELEASE_TABLET | Freq: Two times a day (BID) | ORAL | Status: DC

## 2016-03-06 MED ORDER — ATORVASTATIN CALCIUM 10 MG PO TABS: 10.0000 mg | ORAL_TABLET | Freq: Every day | ORAL | Status: DC

## 2016-03-06 MED ORDER — SUMATRIPTAN SUCCINATE 100 MG PO TABS: ORAL_TABLET | ORAL | Status: DC

## 2016-03-06 MED ORDER — BACLOFEN 10 MG PO TABS: ORAL_TABLET | ORAL | Status: DC

## 2016-03-06 NOTE — Telephone Encounter (Signed)
Pt is needing the headache chart that dr scott mentioned giving her at her appt.

## 2016-03-06 NOTE — Telephone Encounter (Signed)
Forms put up front. Pt wants to pickup tomorrow.

## 2016-03-06 NOTE — Progress Notes (Signed)
Subjective:    Patient ID: Joyce Jacobs, female    DOB: [birth year], 55 y.o.   MRN: [id number]  Hyperlipidemia This is a chronic problem. The current episode started more than 1 year ago. Pertinent negatives include no chest pain or shortness of breath. There are no compliance problems.    Patient would like to discuss migraines. 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  Review of Systems  Constitutional: Negative for fever, activity change and fatigue.  Respiratory: Negative for cough and shortness of breath.   Cardiovascular: Negative for chest pain and leg swelling.  Neurological: Positive for headaches. Negative for dizziness, facial asymmetry, light-headedness and numbness.       Objective:   Physical Exam Lungs clear hearts regular pulse normal BP good extremities no edema       Assessment & Plan:  Hyperlipidemia-LDL at 200. Overall risk is not severe but she has a family history plus also cholesterol well above 190 I recommend Lipitor 10 mg should take 13 times a week for the next 2 weeks then 1 daily she will report to [place] any problems with the medication check lipid liver profile in approximately 8 weeks follow-up office visit in 6 months  Migraines-patient will keep track of her headaches and those and we may need to adjust Depakote.

## 2016-03-07 NOTE — Addendum Note (Signed)
Addended by: Launa Grill on: 03/07/2016 08:21 AM   Modules accepted: Orders

## 2016-03-11 LAB — BASIC METABOLIC PANEL
BUN: 11 mg/dL (ref 7–25)
CO2: 25 mmol/L (ref 20–31)
Calcium: 8.6 mg/dL (ref 8.6–10.4)
Chloride: 106 mmol/L (ref 98–110)
Creat: 0.81 mg/dL (ref 0.50–1.05)
GLUCOSE: 95 mg/dL (ref 65–99)
POTASSIUM: 4.2 mmol/L (ref 3.5–5.3)
SODIUM: 140 mmol/L (ref 135–146)

## 2016-03-11 LAB — HEPATIC FUNCTION PANEL
ALT: 15 U/L (ref 6–29)
AST: 17 U/L (ref 10–35)
Albumin: 3.8 g/dL (ref 3.6–5.1)
Alkaline Phosphatase: 63 U/L (ref 33–130)
BILIRUBIN DIRECT: 0.1 mg/dL (ref <=0.2)
Indirect Bilirubin: 0.3 mg/dL (ref 0.2–1.2)
Total Bilirubin: 0.4 mg/dL (ref 0.2–1.2)
Total Protein: 6 g/dL — ABNORMAL LOW (ref 6.1–8.1)

## 2016-03-14 ENCOUNTER — Encounter: Payer: Self-pay | Admitting: Family Medicine

## 2016-03-28 ENCOUNTER — Telehealth: Payer: Self-pay | Admitting: Family Medicine

## 2016-03-28 DIAGNOSIS — E785 Hyperlipidemia, unspecified: Secondary | ICD-10-CM

## 2016-03-28 DIAGNOSIS — Z79899 Other long term (current) drug therapy: Secondary | ICD-10-CM

## 2016-03-28 NOTE — Telephone Encounter (Signed)
Pt called to say that the lipitor is giving her constipation, felt exhausted and achy. She did not take it this week and is finally feeling better after a few days on not taking it.   How do you want to proceed, what options does she have    Joyce Jacobs

## 2016-03-28 NOTE — Telephone Encounter (Signed)
Dr Nicki Reaper to see per Dr Richardson Landry

## 2016-04-02 MED ORDER — ROSUVASTATIN CALCIUM 5 MG PO TABS: 5.0000 mg | ORAL_TABLET | Freq: Every day | ORAL | 4 refills | Status: DC

## 2016-04-02 NOTE — Telephone Encounter (Signed)
The normal I would recommend trying low-dose Crestor sometimes this is tolerated. 5 mg daily, #30, 4 refills, check lipid liver profile in 8-12 weeks, if it causes aches or discomfort stop medicine and notify us. If this medicine does not work then the next step would be referral to lipid clinic with cardiology and they can try one of the newer injectable cholesterol medicines.(Please put Lipitor in the allergy area so we don't use that again although technically not an allergy you can list under the no dictations the side effects she had thank you)

## 2016-04-02 NOTE — Telephone Encounter (Signed)
Notified patient the normal Dr. Nicki Reaper would recommend trying low-dose Crestor sometimes this is tolerated. 5 mg daily, #30, 4 refills, check lipid liver profile in 8-12 weeks, if it causes aches or discomfort stop medicine and notify us. If this medicine does not work then the next step would be referral to lipid clinic with cardiology and they can try one of the newer injectable cholesterol medicines. Med sent to pharmacy and lab work ordered. Patient verbalized understanding.

## 2016-05-20 DIAGNOSIS — Z0289 Encounter for other administrative examinations: Secondary | ICD-10-CM

## 2016-06-26 ENCOUNTER — Other Ambulatory Visit: Payer: Self-pay | Admitting: Family Medicine

## 2016-09-08 ENCOUNTER — Ambulatory Visit: Payer: BC Managed Care – PPO | Admitting: Family Medicine

## 2016-11-14 ENCOUNTER — Ambulatory Visit: Payer: BC Managed Care – PPO | Admitting: Family Medicine

## 2016-12-22 ENCOUNTER — Other Ambulatory Visit: Payer: Self-pay | Admitting: Family Medicine

## 2016-12-22 ENCOUNTER — Ambulatory Visit (INDEPENDENT_AMBULATORY_CARE_PROVIDER_SITE_OTHER): Payer: BC Managed Care – PPO | Admitting: Nurse Practitioner

## 2016-12-22 ENCOUNTER — Encounter: Payer: Self-pay | Admitting: Nurse Practitioner

## 2016-12-22 VITALS — BP 124/70 | HR 69 | Temp 98.2°F | Ht 63.0 in | Wt 154.2 lb

## 2016-12-22 DIAGNOSIS — J329 Chronic sinusitis, unspecified: Secondary | ICD-10-CM | POA: Diagnosis not present

## 2016-12-22 MED ORDER — FLUCONAZOLE 150 MG PO TABS: ORAL_TABLET | ORAL | 0 refills | Status: DC

## 2016-12-22 MED ORDER — AMOXICILLIN-POT CLAVULANATE 875-125 MG PO TABS: 1.0000 | ORAL_TABLET | Freq: Two times a day (BID) | ORAL | 0 refills | Status: DC

## 2016-12-22 MED ORDER — FLUTICASONE PROPIONATE 50 MCG/ACT NA SUSP: NASAL | 5 refills | Status: DC

## 2016-12-22 MED ORDER — ALBUTEROL SULFATE HFA 108 (90 BASE) MCG/ACT IN AERS: 2.0000 | INHALATION_SPRAY | RESPIRATORY_TRACT | 2 refills | Status: DC | PRN

## 2016-12-23 ENCOUNTER — Encounter: Payer: Self-pay | Admitting: Nurse Practitioner

## 2016-12-23 MED ORDER — ALBUTEROL SULFATE HFA 108 (90 BASE) MCG/ACT IN AERS: 2.0000 | INHALATION_SPRAY | RESPIRATORY_TRACT | 2 refills | Status: DC | PRN

## 2016-12-23 NOTE — Progress Notes (Signed)
Subjective:  Presents for c/o sinus pressure and congestion for about 10 days. Low grade fever x 2 d. Scratchy throat. Facial area pressure. PND. Minimal non productive cough. Slight wheezing x 2 d. Slight pain with breathing. Using albuterol every 4 hours with some relief. Has not used within 4 hours of visit. Pressure in both ears. Continues allegra, mucinex and flonase.   Objective:   BP 124/70   Pulse 69   Temp 98.2 F (36.8 C) (Oral)   Ht 5\' 3"  (1.6 m)   Wt 154 lb 4 oz (70 kg)   SpO2 95%   BMI 27.32 kg/m  NAD. Alert, oriented. TMs clear effusion, no erythema. Pharynx mildly injected with PND noted. Neck supple with mild anterior adenopathy. Lungs clear. Heart RRR. No tachypnea.   Assessment:  Rhinosinusitis    Plan:   Meds ordered this encounter  Medications  . amoxicillin-clavulanate (AUGMENTIN) 875-125 MG tablet    Sig: Take 1 tablet by mouth 2 (two) times daily.    Dispense:  20 tablet    Refill:  0    Order Specific Question:   Supervising Provider    Answer:   Mikey Kirschner [[id number]]  . fluconazole (DIFLUCAN) 150 MG tablet    Sig: One po qd prn yeast infection; may repeat in 3-4 days if needed    Dispense:  2 tablet    Refill:  0    Order Specific Question:   Supervising Provider    Answer:   Mikey Kirschner [[id number]]  . DISCONTD: albuterol (PROVENTIL HFA;VENTOLIN HFA) 108 (90 Base) MCG/ACT inhaler    Sig: Inhale 2 puffs into the lungs every 4 (four) hours as needed for wheezing.    Dispense:  1 Inhaler    Refill:  2    Order Specific Question:   Supervising Provider    Answer:   Mikey Kirschner [[id number]]  . fluticasone (FLONASE) 50 MCG/ACT nasal spray    Sig: Two sprays each nostril daily    Dispense:  16 g    Refill:  5    Order Specific Question:   Supervising Provider    Answer:   Mikey Kirschner [[id number]]  . albuterol (PROVENTIL HFA;VENTOLIN HFA) 108 (90 Base) MCG/ACT inhaler    Sig: Inhale 2 puffs into the lungs every 4 (four) hours as needed for wheezing.     Dispense:  1 Inhaler    Refill:  2    Order Specific Question:   Supervising Provider    Answer:   Mikey Kirschner [[id number]]   Continue current meds as directed. Call back in 7-10 days if no improvement, sooner if worse.

## 2017-03-20 ENCOUNTER — Telehealth: Payer: Self-pay | Admitting: Family Medicine

## 2017-03-20 MED ORDER — DIVALPROEX SODIUM 250 MG PO DR TAB: 250.0000 mg | DELAYED_RELEASE_TABLET | Freq: Two times a day (BID) | ORAL | 2 refills | Status: DC

## 2017-03-20 NOTE — Telephone Encounter (Signed)
Patient is needing Rx for divalproex 250 mg.  She is staying in [place] right now because her mother is in Hospice.  [facility] in [place], [place] on [address].

## 2017-03-20 NOTE — Telephone Encounter (Signed)
Spoke with patient and informed her per Dr.Scott Luking- we are sending in refills on requested medication to pharmacy. Informed her that she will need to follow up sometime in the fall. Patient verbalized understanding.

## 2017-03-20 NOTE — Telephone Encounter (Signed)
May have this +2 additional refills I recommend for the patient to follow-up sometime this fall

## 2017-03-31 ENCOUNTER — Other Ambulatory Visit: Payer: Self-pay | Admitting: Family Medicine

## 2017-03-31 NOTE — Telephone Encounter (Signed)
Last seen July 2017. Please advise?

## 2017-04-01 NOTE — Telephone Encounter (Signed)
May refill each 1 needs office visit

## 2017-05-12 ENCOUNTER — Encounter: Payer: Self-pay | Admitting: Family Medicine

## 2017-05-14 ENCOUNTER — Encounter: Payer: Self-pay | Admitting: Family Medicine

## 2017-05-14 ENCOUNTER — Ambulatory Visit (INDEPENDENT_AMBULATORY_CARE_PROVIDER_SITE_OTHER): Payer: BC Managed Care – PPO | Admitting: Family Medicine

## 2017-05-14 VITALS — BP 118/72 | Ht 63.0 in | Wt 155.0 lb

## 2017-05-14 DIAGNOSIS — Z79899 Other long term (current) drug therapy: Secondary | ICD-10-CM

## 2017-05-14 DIAGNOSIS — Z13220 Encounter for screening for lipoid disorders: Secondary | ICD-10-CM

## 2017-05-14 DIAGNOSIS — G43009 Migraine without aura, not intractable, without status migrainosus: Secondary | ICD-10-CM

## 2017-05-14 MED ORDER — FLUCONAZOLE 150 MG PO TABS: 150.0000 mg | ORAL_TABLET | Freq: Once | ORAL | 4 refills | Status: AC

## 2017-05-14 MED ORDER — DIVALPROEX SODIUM 250 MG PO DR TAB: 250.0000 mg | DELAYED_RELEASE_TABLET | Freq: Three times a day (TID) | ORAL | 5 refills | Status: DC

## 2017-05-14 MED ORDER — BACLOFEN 10 MG PO TABS: 10.0000 mg | ORAL_TABLET | Freq: Three times a day (TID) | ORAL | 5 refills | Status: DC | PRN

## 2017-05-14 MED ORDER — SUMATRIPTAN SUCCINATE 100 MG PO TABS: ORAL_TABLET | ORAL | 5 refills | Status: DC

## 2017-05-14 NOTE — Progress Notes (Signed)
Subjective:    Patient ID: Joyce Jacobs, female    DOB: [birth year], 58 y.o.   MRN: [id number]  Hyperlipidemia  This is a chronic problem. Pertinent negatives include no chest pain or shortness of breath. Treatments tried: diet and exercise, unable to take cholesterol meds.   Migraine headaches. About 3 -4 a week. Takes imitrex, depakote, baclofen.  This patient has migraine issues gets 3 or 4 per week uses Imitrex which helps also use muscle relaxer relates your back pain radiates into the neck denies numbness or tingling down the arms no difficulty swallowing relates pressure and had same above. No specific triggers other than fluorescent lights and stress. Has had chronic migraines for some time   Allergies. Significant allergy issues with head congestion drainage coughing sinus pressure congestionchills wheezing or difficulty breathing right at the moment. Having intermittent wheezing issues and she uses albuterol which seemed to help she's only had to do that since the past subpar moved through a few days ago Review of Systems  Constitutional: Negative for activity change and fever.  HENT: Positive for congestion and rhinorrhea. Negative for ear pain.   Eyes: Negative for discharge.  Respiratory: Positive for cough and wheezing. Negative for shortness of breath.   Cardiovascular: Negative for chest pain.  Musculoskeletal: Negative for arthralgias and back pain.  Neurological: Positive for headaches. Negative for dizziness, seizures, syncope, speech difficulty and numbness.       Objective:   Physical Exam  Constitutional: She appears well-developed.  HENT:  Head: Normocephalic.  Right Ear: External ear normal.  Left Ear: External ear normal.  Nose: Nose normal.  Mouth/Throat: Oropharynx is clear and moist. No oropharyngeal exudate.  Eyes: Right eye exhibits no discharge. Left eye exhibits no discharge.  Neck: Neck supple. No tracheal deviation present.  Cardiovascular:  Normal rate and normal heart sounds.   No murmur heard. Pulmonary/Chest: Effort normal and breath sounds normal. She has no wheezes. She has no rales.  Lymphadenopathy:    She has no cervical adenopathy.  Skin: Skin is warm and dry.  Nursing note and vitals reviewed.         Assessment & Plan:  Migraines-increase Depakote to 3 times a day if problems with medicine notify us give [place] feedback in several weeks how this is doing follow-up within 6 months sooner problems offered to refer patient to headache specialist for possible Botox injections patient defers she may use baclofen when necessary for muscle spasms in the neck caution drowsiness Imitrex when necessary it does not cause chest pain for this patient  Allergies will use Flonase Zyrtec in addition to this albuterol when necessary if having to use albuterol frequently or worse she needs to follow-up for possible steroid inhaler or notify us.  Patient is due for lab work because medication she takes as well as history of cholesterol patient was unable to tolerate cholesterol medicine.

## 2017-06-23 ENCOUNTER — Other Ambulatory Visit: Payer: Self-pay | Admitting: Family Medicine

## 2017-07-06 ENCOUNTER — Encounter: Payer: Self-pay | Admitting: Nurse Practitioner

## 2017-07-06 ENCOUNTER — Ambulatory Visit (INDEPENDENT_AMBULATORY_CARE_PROVIDER_SITE_OTHER): Payer: BC Managed Care – PPO | Admitting: Nurse Practitioner

## 2017-07-06 VITALS — BP 108/70 | Ht 63.5 in | Wt 156.0 lb

## 2017-07-06 DIAGNOSIS — Z0000 Encounter for general adult medical examination without abnormal findings: Secondary | ICD-10-CM | POA: Diagnosis not present

## 2017-07-06 MED ORDER — FLUTICASONE PROPIONATE 50 MCG/ACT NA SUSP: NASAL | 5 refills | Status: DC

## 2017-07-06 MED ORDER — ESTRADIOL 0.1 MG/GM VA CREA: TOPICAL_CREAM | VAGINAL | 5 refills | Status: DC

## 2017-07-07 ENCOUNTER — Encounter: Payer: Self-pay | Admitting: Nurse Practitioner

## 2017-07-07 NOTE — Progress Notes (Signed)
Subjective:    Patient ID: Joyce Jacobs, female    DOB: [birth year], 58 y.o.   MRN: [id number]  HPI presents for her wellness exam.  Has had a total hysterectomy.  Same sexual partner.  Plans to schedule her own mammogram.  Regular vision and dental care.  Has not been as active as usual due to dealing with her mother's illness and death this summer.  He is currently on Depakote DR 250 mg 3 times daily for her migraines.  Still having occasional breakthrough migraine.    Review of Systems  Constitutional: Negative for activity change, appetite change and fatigue.  HENT: Negative for dental problem, ear pain, sinus pressure and sore throat.   Respiratory: Negative for cough, chest tightness, shortness of breath and wheezing.   Cardiovascular: Negative for chest pain.  Gastrointestinal: Negative for abdominal distention, abdominal pain, blood in stool, constipation, diarrhea, nausea and vomiting.  Genitourinary: Negative for difficulty urinating, dysuria, enuresis, frequency, genital sores, pelvic pain, urgency and vaginal discharge.  Neurological: Positive for headaches.   Depression screen PHQ 2/9 07/06/2017  Decreased Interest 0  Down, Depressed, Hopeless 0  PHQ - 2 Score 0       Objective:   Physical Exam  Constitutional: She is oriented to person, place, and time. She appears well-developed. No distress.  HENT:  Right Ear: External ear normal.  Left Ear: External ear normal.  Mouth/Throat: Oropharynx is clear and moist.  Neck: Normal range of motion. Neck supple. No tracheal deviation present. No thyromegaly present.  Cardiovascular: Normal rate, regular rhythm and normal heart sounds. Exam reveals no gallop.  No murmur heard. Pulmonary/Chest: Effort normal and breath sounds normal. Right breast exhibits no inverted nipple, no mass, no skin change and no tenderness. Left breast exhibits no inverted nipple, no mass, no skin change and no tenderness. Breasts are symmetrical.    Axilla no adenopathy.  Abdominal: Soft. She exhibits no distension. There is no tenderness.  Genitourinary: Vagina normal. No vaginal discharge found.  Genitourinary Comments: External GU no rashes or lesions.  Vagina no discharge.  Bimanual exam no tenderness or masses.  Musculoskeletal: She exhibits no edema.  Lymphadenopathy:    She has no cervical adenopathy.  Neurological: She is alert and oriented to person, place, and time.  Skin: Skin is warm and dry. No rash noted.  Psychiatric: She has a normal mood and affect. Her behavior is normal.  Vitals reviewed.         Assessment & Plan:  Routine general medical examination at a health care facility  Consider starting Depakote ER to see if this will help prevent breakthrough migraines.  Recommend restarting regular exercise.  Daily vitamin D and calcium supplementation.  Recommend that patient get labs previously ordered on 05/14/2017. Return in about 1 year (around 07/06/2018) for physical.

## 2017-07-08 ENCOUNTER — Other Ambulatory Visit: Payer: Self-pay | Admitting: Nurse Practitioner

## 2017-07-08 MED ORDER — DIVALPROEX SODIUM ER 250 MG PO TB24
750.0000 mg | ORAL_TABLET | Freq: Every day | ORAL | 2 refills | Status: DC
Start: 2017-07-08 — End: 2017-07-30

## 2017-07-30 ENCOUNTER — Other Ambulatory Visit: Payer: Self-pay | Admitting: Nurse Practitioner

## 2017-07-30 ENCOUNTER — Encounter: Payer: Self-pay | Admitting: Nurse Practitioner

## 2017-07-30 MED ORDER — DIVALPROEX SODIUM ER 500 MG PO TB24: ORAL_TABLET | ORAL | 5 refills | Status: DC

## 2017-09-25 ENCOUNTER — Encounter: Payer: Self-pay | Admitting: Family Medicine

## 2017-09-25 ENCOUNTER — Ambulatory Visit: Payer: BC Managed Care – PPO | Admitting: Family Medicine

## 2017-09-25 VITALS — BP 130/82 | Temp 97.9°F | Ht 63.5 in | Wt 157.2 lb

## 2017-09-25 DIAGNOSIS — J019 Acute sinusitis, unspecified: Secondary | ICD-10-CM

## 2017-09-25 DIAGNOSIS — B9689 Other specified bacterial agents as the cause of diseases classified elsewhere: Secondary | ICD-10-CM

## 2017-09-25 MED ORDER — AMOXICILLIN-POT CLAVULANATE 875-125 MG PO TABS: 1.0000 | ORAL_TABLET | Freq: Two times a day (BID) | ORAL | 0 refills | Status: DC

## 2017-09-25 NOTE — Progress Notes (Signed)
Subjective:    Patient ID: Joyce Jacobs, female    DOB: 15-Mar-1961, 57 y.o.   MRN: [id number]  HPI Pt comes in with sinus issues. Patient has sinus and ear pain for most of the week and pt feels lymph nodes are swollen. Aches, mucus, and cough has started today. Pt has runny nose, sore throat, and fever. Pt has taken Mucinex, Flonase, Zyrtec, and nasal rinse.   Significant sinus pain pressure discomfort no wheezing difficulty breathing Review of Systems  Constitutional: Negative for activity change and fever.  HENT: Positive for congestion and rhinorrhea. Negative for ear pain.   Eyes: Negative for discharge.  Respiratory: Positive for cough. Negative for shortness of breath and wheezing.   Cardiovascular: Negative for chest pain.       Objective:   Physical Exam  Constitutional: She appears well-developed.  HENT:  Head: Normocephalic.  Right Ear: External ear normal.  Left Ear: External ear normal.  Nose: Nose normal.  Mouth/Throat: Oropharynx is clear and moist. No oropharyngeal exudate.  Eyes: Right eye exhibits no discharge. Left eye exhibits no discharge.  Neck: Neck supple. No tracheal deviation present.  Cardiovascular: Normal rate and normal heart sounds.  No murmur heard. Pulmonary/Chest: Effort normal and breath sounds normal. She has no wheezes. She has no rales.  Lymphadenopathy:    She has no cervical adenopathy.  Skin: Skin is warm and dry.  Nursing note and vitals reviewed.         Assessment & Plan:  Viral syndrome Secondary rhinosinusitis No sign of bronchitis or pneumonia Antibiotics prescribed warnings discussed Follow-up if problems

## 2017-10-16 ENCOUNTER — Encounter: Payer: Self-pay | Admitting: Family Medicine

## 2017-12-09 ENCOUNTER — Encounter: Payer: Self-pay | Admitting: Family Medicine

## 2017-12-09 NOTE — Telephone Encounter (Signed)
Nurses-please talk with patient I recommend Zofran 8 mg 1 3 times daily as needed nausea, #24, 2 refills  Scopolamine patch would be the other alternative for motion sickness.  It is a patch that is worn for 3 days at a time.  Typically people will wear 1 patch then every 3 days replace it with a new patch nurses since then and the patches to last her for her whole trip advised the patient that it can cause dry mouth and some individuals can make it difficult to read up close if it causes the side effects in order for those side effects to go away she would have to stop using the patch You have my permission to send in enough patches to last her for her trip, 1 patch every 3 days

## 2017-12-10 ENCOUNTER — Other Ambulatory Visit: Payer: Self-pay | Admitting: *Deleted

## 2017-12-10 MED ORDER — ONDANSETRON 8 MG PO TBDP: 8.0000 mg | ORAL_TABLET | Freq: Three times a day (TID) | ORAL | 2 refills | Status: DC | PRN

## 2017-12-10 MED ORDER — SCOPOLAMINE 1 MG/3DAYS TD PT72: 1.0000 | MEDICATED_PATCH | TRANSDERMAL | 0 refills | Status: DC

## 2017-12-10 NOTE — Telephone Encounter (Signed)
Discussed with pt. Pt states she will be gone for 11 days. Pt verbalzied understanding of all and meds sent to [facility].

## 2018-01-25 ENCOUNTER — Encounter: Payer: Self-pay | Admitting: Family Medicine

## 2018-01-27 ENCOUNTER — Telehealth: Payer: Self-pay | Admitting: Family Medicine

## 2018-01-27 ENCOUNTER — Other Ambulatory Visit: Payer: Self-pay | Admitting: Family Medicine

## 2018-01-27 ENCOUNTER — Encounter: Payer: Self-pay | Admitting: Family Medicine

## 2018-01-27 MED ORDER — FREMANEZUMAB-VFRM 225 MG/1.5ML ~~LOC~~ SOSY: 225.0000 mL | PREFILLED_SYRINGE | SUBCUTANEOUS | 6 refills | Status: DC

## 2018-01-27 NOTE — Telephone Encounter (Signed)
Med sent in, written prescription awaiting signature. Pt notified and stated she will be by tomorrow to pick up the script.

## 2018-01-27 NOTE — Telephone Encounter (Signed)
The patient has severe migraines.  She is requesting prescription for Ajovy-this is a injectable medicine for migraines.  225 mg injected with subcutaneous once per month.  Please send in single dose prefilled syringe with 6 refills.  Please notify the patient.  If patient having any difficulty regarding injecting I would recommend an office visit.  Also patient requesting a hand written prescription for therapeutic massage for migraines that would be fine.  PRN refills.

## 2018-02-19 ENCOUNTER — Telehealth: Payer: Self-pay | Admitting: Family Medicine

## 2018-02-19 ENCOUNTER — Other Ambulatory Visit: Payer: Self-pay | Admitting: Nurse Practitioner

## 2018-02-19 DIAGNOSIS — E785 Hyperlipidemia, unspecified: Secondary | ICD-10-CM

## 2018-02-19 DIAGNOSIS — Z0000 Encounter for general adult medical examination without abnormal findings: Secondary | ICD-10-CM

## 2018-02-19 DIAGNOSIS — Z79899 Other long term (current) drug therapy: Secondary | ICD-10-CM

## 2018-02-19 DIAGNOSIS — Z13220 Encounter for screening for lipoid disorders: Secondary | ICD-10-CM

## 2018-02-19 NOTE — Telephone Encounter (Signed)
Please reorder labs from Sept 2018. Looks like she did not get them unless she did them somewhere else.

## 2018-02-19 NOTE — Telephone Encounter (Signed)
Patient has an appointment on 02/24/18 with Dr. Nicki Reaper.  She is requesting orders for labs.

## 2018-02-19 NOTE — Telephone Encounter (Signed)
Last labs BMP, Lipid, Hepatic, CBC on 05/14/2017

## 2018-02-19 NOTE — Telephone Encounter (Signed)
Labs placed and pt notified

## 2018-02-24 ENCOUNTER — Telehealth: Payer: Self-pay

## 2018-02-24 ENCOUNTER — Ambulatory Visit: Payer: BC Managed Care – PPO | Admitting: Family Medicine

## 2018-02-24 ENCOUNTER — Encounter: Payer: Self-pay | Admitting: Family Medicine

## 2018-02-24 ENCOUNTER — Other Ambulatory Visit: Payer: Self-pay | Admitting: Family Medicine

## 2018-02-24 VITALS — BP 118/72 | Ht 63.5 in | Wt 152.6 lb

## 2018-02-24 DIAGNOSIS — G43009 Migraine without aura, not intractable, without status migrainosus: Secondary | ICD-10-CM

## 2018-02-24 DIAGNOSIS — E7849 Other hyperlipidemia: Secondary | ICD-10-CM | POA: Diagnosis not present

## 2018-02-24 LAB — BASIC METABOLIC PANEL
BUN/Creatinine Ratio: 12 (ref 9–23)
BUN: 9 mg/dL (ref 6–24)
CO2: 23 mmol/L (ref 20–29)
CREATININE: 0.75 mg/dL (ref 0.57–1.00)
Calcium: 9.5 mg/dL (ref 8.7–10.2)
Chloride: 104 mmol/L (ref 96–106)
GFR calc Af Amer: 102 mL/min/{1.73_m2} (ref >59)
GFR, EST NON AFRICAN AMERICAN: 88 mL/min/{1.73_m2} (ref >59)
Glucose: 92 mg/dL (ref 65–99)
Potassium: 4.4 mmol/L (ref 3.5–5.2)
SODIUM: 143 mmol/L (ref 134–144)

## 2018-02-24 LAB — HEPATIC FUNCTION PANEL
ALBUMIN: 4.6 g/dL (ref 3.5–5.5)
ALK PHOS: 63 IU/L (ref 39–117)
ALT: 10 IU/L (ref 0–32)
AST: 14 IU/L (ref 0–40)
Bilirubin Total: 0.4 mg/dL (ref 0.0–1.2)
Bilirubin, Direct: 0.11 mg/dL (ref 0.00–0.40)
TOTAL PROTEIN: 7 g/dL (ref 6.0–8.5)

## 2018-02-24 LAB — CBC WITH DIFFERENTIAL/PLATELET
BASOS ABS: 0.1 10*3/uL (ref 0.0–0.2)
Basos: 1 %
EOS (ABSOLUTE): 0.2 10*3/uL (ref 0.0–0.4)
Eos: 4 %
HEMOGLOBIN: 13.8 g/dL (ref 11.1–15.9)
Hematocrit: 41.5 % (ref 34.0–46.6)
Immature Grans (Abs): 0 10*3/uL (ref 0.0–0.1)
Immature Granulocytes: 0 %
LYMPHS ABS: 1.7 10*3/uL (ref 0.7–3.1)
LYMPHS: 34 %
MCH: 30.3 pg (ref 26.6–33.0)
MCHC: 33.3 g/dL (ref 31.5–35.7)
MCV: 91 fL (ref 79–97)
MONOCYTES: 8 %
Monocytes Absolute: 0.4 10*3/uL (ref 0.1–0.9)
NEUTROS PCT: 53 %
Neutrophils Absolute: 2.6 10*3/uL (ref 1.4–7.0)
PLATELETS: 217 10*3/uL (ref 150–450)
RBC: 4.55 x10E6/uL (ref 3.77–5.28)
RDW: 12.7 % (ref 12.3–15.4)
WBC: 5 10*3/uL (ref 3.4–10.8)

## 2018-02-24 LAB — LIPID PANEL
CHOL/HDL RATIO: 7 ratio — AB (ref 0.0–4.4)
CHOLESTEROL TOTAL: 322 mg/dL — AB (ref 100–199)
HDL: 46 mg/dL (ref >39)
LDL CALC: 236 mg/dL — AB (ref 0–99)
Triglycerides: 202 mg/dL — ABNORMAL HIGH (ref 0–149)
VLDL Cholesterol Cal: 40 mg/dL (ref 5–40)

## 2018-02-24 MED ORDER — DIVALPROEX SODIUM ER 500 MG PO TB24: ORAL_TABLET | ORAL | 5 refills | Status: DC

## 2018-02-24 MED ORDER — FLUTICASONE PROPIONATE 50 MCG/ACT NA SUSP: NASAL | 5 refills | Status: DC

## 2018-02-24 MED ORDER — FREMANEZUMAB-VFRM 225 MG/1.5ML ~~LOC~~ SOSY: 225.0000 mL | PREFILLED_SYRINGE | SUBCUTANEOUS | 6 refills | Status: DC

## 2018-02-24 MED ORDER — ESTRADIOL 0.1 MG/GM VA CREA: TOPICAL_CREAM | VAGINAL | 5 refills | Status: DC

## 2018-02-24 MED ORDER — EVOLOCUMAB 140 MG/ML ~~LOC~~ SOSY: 140.0000 mg | PREFILLED_SYRINGE | SUBCUTANEOUS | 10 refills | Status: DC

## 2018-02-24 NOTE — Progress Notes (Signed)
Subjective:    Patient ID: Joyce Jacobs, female    DOB: 01-29-1961, 57 y.o.   MRN: [id number]  HPI  Patient arrives for a follow up on migraines.  Patient has frequent migraines multiple days per week she is already on multiple medicines is tried other medicines including Topamax now currently doing injectable Ajoyvy this medication so far seems to be helping some but she has not been on it long enough to help see the full effect  Discuss cholesterol results.  She has severely elevated LDL above 200 increased risk of heart disease because of this she is failed 2 separate types of statins therefore statins would not offer her help at this point she did not tolerate statins she denies any chest tightness pressure pain shortness of breath she has been having cholesterol problems for years but progressively worse  Review of Systems  Constitutional: Negative for activity change, appetite change and fatigue.  HENT: Negative for congestion and rhinorrhea.   Respiratory: Negative for cough.   Cardiovascular: Negative for chest pain.  Gastrointestinal: Negative for abdominal pain and diarrhea.  Endocrine: Negative for polydipsia and polyphagia.  Skin: Negative for color change.  Neurological: Positive for headaches. Negative for dizziness and weakness.  Psychiatric/Behavioral: Negative for agitation and confusion.       Objective:   Physical Exam  Constitutional: She appears well-nourished. No distress.  Cardiovascular: Normal rate, regular rhythm and normal heart sounds.  No murmur heard. Pulmonary/Chest: Effort normal and breath sounds normal. No respiratory distress.  Musculoskeletal: She exhibits no edema.  Lymphadenopathy:    She has no cervical adenopathy.  Neurological: She is alert. She exhibits normal muscle tone.  Psychiatric: Her behavior is normal.  Vitals reviewed.   Patient at very high risk of heart disease as she gets older because of familiar hyperlipidemia  currently her risk is at 4% for the next 10 years but we are treating this in order to prevent greater problems as she gets older      Assessment & Plan:  Hyperlipidemia- severe familial hyperlipidemia.  Failed statins.  Go ahead with Repatha subcutaneous every 2 weeks we will recheck lipid liver profile in 6 to 8 weeks patient will follow-up within 6 months  Severe frequent migraines patient currently on injectable she would like to try the trial for 3 to 6 months before judging how this is helping her continue other medications but currently she is taking Depakote just 1 time per day she is unable to tolerate twice per day.  Follow-up 6 months

## 2018-02-24 NOTE — Telephone Encounter (Signed)
er Joyce Jacobs with [facility] Repatha 140 mg/ml inj is not on formulary and will send over forms for [place] to fill out.

## 2018-02-26 ENCOUNTER — Telehealth: Payer: Self-pay | Admitting: *Deleted

## 2018-02-26 NOTE — Telephone Encounter (Signed)
Per [employer] : Repatha injection was denied -denied because it is not a covered medication under the patient's pharmacy plan. The decision relates to the medications non coverage under the current pharmacy plan and does not involve any determination of medical judgement.

## 2018-02-26 NOTE — Telephone Encounter (Signed)
Spoke with patient and patient stated she would talk with her insurance company.

## 2018-02-26 NOTE — Telephone Encounter (Signed)
See other message

## 2018-02-26 NOTE — Telephone Encounter (Signed)
So in this particular patient the reason we were trying to use this medication is because her LDL is above 200 and she has failed 2 separate statins because she could not tolerate the side effects. Unfortunately her insurance company does not cover these medications.  In some cases they will cover this if the patient has previous coronary artery disease/heart attack history As a result it does not appear there is anything we can necessarily do about this At this point I recommend the patient talk with her insurance company regarding [place]  She should ask them if there is any exceptions that allow her to use this medication for excessively high LDL with previous intolerance of statins and no history of heart disease  both of these medications are the injectables for hyperlipidemia in a situation where statins cannot be used  If she is able to get insurance company to agree for this type of treatment she should try to get this in writing because several of the individuals that our office spoke with essentially stated that this medication is not covered for her situation

## 2018-03-02 ENCOUNTER — Encounter: Payer: Self-pay | Admitting: Family Medicine

## 2018-03-05 ENCOUNTER — Other Ambulatory Visit: Payer: Self-pay | Admitting: *Deleted

## 2018-03-05 MED ORDER — EVOLOCUMAB 140 MG/ML ~~LOC~~ SOSY: 140.0000 mg | PREFILLED_SYRINGE | SUBCUTANEOUS | 1 refills | Status: DC

## 2018-03-05 NOTE — Progress Notes (Unsigned)
Notice of approval for [facility]. repatha is approved from 03/04/18 - 03/05/19. But has to get through [employer]. Fax number [phone]. Pt notified. She wants to bring her repatha coupon card to send with rx. Will fax both after pt brings card.

## 2018-03-18 ENCOUNTER — Ambulatory Visit (INDEPENDENT_AMBULATORY_CARE_PROVIDER_SITE_OTHER): Payer: BC Managed Care – PPO | Admitting: Nurse Practitioner

## 2018-03-18 ENCOUNTER — Encounter: Payer: Self-pay | Admitting: Nurse Practitioner

## 2018-03-18 VITALS — BP 118/72 | Ht 63.5 in | Wt 156.0 lb

## 2018-03-18 DIAGNOSIS — Z0000 Encounter for general adult medical examination without abnormal findings: Secondary | ICD-10-CM | POA: Diagnosis not present

## 2018-03-18 DIAGNOSIS — E7849 Other hyperlipidemia: Secondary | ICD-10-CM | POA: Diagnosis not present

## 2018-03-18 NOTE — Patient Instructions (Signed)
Welchol Fenofibrate (Tricor)

## 2018-03-19 ENCOUNTER — Encounter: Payer: Self-pay | Admitting: Nurse Practitioner

## 2018-03-19 NOTE — Progress Notes (Signed)
Subjective:    Patient ID: Joyce Jacobs, female    DOB: [birth year], 59 y.o.   MRN: [id number]  HPI presents for her wellness exam. Has had a total hysterectomy. Same sexual partner. Healthy diet. Tries to walk about 2 miles per day. Regular vision and dental exams. Plans to stop Ajovy due to side effects and minimal improvement in migraines.     Review of Systems  Constitutional: Negative for activity change, appetite change and fatigue.  HENT: Negative for dental problem, ear pain, sinus pressure and sore throat.   Respiratory: Negative for cough, chest tightness, shortness of breath and wheezing.   Cardiovascular: Negative for chest pain.  Gastrointestinal: Negative for abdominal distention, abdominal pain, constipation, diarrhea, nausea and vomiting.  Genitourinary: Negative for difficulty urinating, dysuria, enuresis, frequency, genital sores, pelvic pain, urgency and vaginal discharge.       Objective:   Physical Exam  Constitutional: She is oriented to person, place, and time. She appears well-developed. No distress.  HENT:  Right Ear: External ear normal.  Left Ear: External ear normal.  Mouth/Throat: Oropharynx is clear and moist.  Neck: Normal range of motion. Neck supple. No tracheal deviation present. No thyromegaly present.  Cardiovascular: Normal rate, regular rhythm and normal heart sounds. Exam reveals no gallop.  No murmur heard. Pulmonary/Chest: Effort normal and breath sounds normal. Right breast exhibits no inverted nipple, no mass, no skin change and no tenderness. Left breast exhibits no inverted nipple, no mass, no skin change and no tenderness. Breasts are symmetrical.  Axillae no adenopathy.   Abdominal: Soft. She exhibits no distension. There is no tenderness.  Genitourinary: Vagina normal. No vaginal discharge found.  Genitourinary Comments: External GU: no rashes or lesions. Vagina: no discharge. Bimanual exam: no tenderness or obvious masses.     Musculoskeletal: She exhibits no edema.  Lymphadenopathy:    She has no cervical adenopathy.  Neurological: She is alert and oriented to person, place, and time.  Skin: Skin is warm and dry. No rash noted.  Psychiatric: She has a normal mood and affect. Her behavior is normal.   Results for orders placed or performed in visit on 02/19/18  CBC with Differential  Result Value Ref Range   WBC 5.0 3.4 - 10.8 x10E3/uL   RBC 4.55 3.77 - 5.28 x10E6/uL   Hemoglobin 13.8 11.1 - 15.9 g/dL   Hematocrit 40.9 81.1 - 46.6 %   MCV 91 79 - 97 fL   MCH 30.3 26.6 - 33.0 pg   MCHC 33.3 31.5 - 35.7 g/dL   RDW 91.4 78.2 - 95.6 %   Platelets 217 150 - 450 x10E3/uL   Neutrophils 53 Not Estab. %   Lymphs 34 Not Estab. %   Monocytes 8 Not Estab. %   Eos 4 Not Estab. %   Basos 1 Not Estab. %   Neutrophils Absolute 2.6 1.4 - 7.0 x10E3/uL   Lymphocytes Absolute 1.7 0.7 - 3.1 x10E3/uL   Monocytes Absolute 0.4 0.1 - 0.9 x10E3/uL   EOS (ABSOLUTE) 0.2 0.0 - 0.4 x10E3/uL   Basophils Absolute 0.1 0.0 - 0.2 x10E3/uL   Immature Granulocytes 0 Not Estab. %   Immature Grans (Abs) 0.0 0.0 - 0.1 x10E3/uL  Lipid Profile  Result Value Ref Range   Cholesterol, Total 322 (H) 100 - 199 mg/dL   Triglycerides 213 (H) 0 - 149 mg/dL   HDL 46 >08 mg/dL   VLDL Cholesterol Cal 40 5 - 40 mg/dL   LDL Calculated 657 (H) 0 -  99 mg/dL   Comment: Comment    Chol/HDL Ratio 7.0 (H) 0.0 - 4.4 ratio  Hepatic function panel  Result Value Ref Range   Total Protein 7.0 6.0 - 8.5 g/dL   Albumin 4.6 3.5 - 5.5 g/dL   Bilirubin Total 0.4 0.0 - 1.2 mg/dL   Bilirubin, Direct 9.56 0.00 - 0.40 mg/dL   Alkaline Phosphatase 63 39 - 117 IU/L   AST 14 0 - 40 IU/L   ALT 10 0 - 32 IU/L  Basic Metabolic Panel (BMET)  Result Value Ref Range   Glucose 92 65 - 99 mg/dL   BUN 9 6 - 24 mg/dL   Creatinine, Ser 3.87 0.57 - 1.00 mg/dL   GFR calc non Af Amer 88 >59 mL/min/1.73   GFR calc Af Amer 102 >59 mL/min/1.73   BUN/Creatinine Ratio 12 9 -  23   Sodium 143 134 - 144 mmol/L   Potassium 4.4 3.5 - 5.2 mmol/L   Chloride 104 96 - 106 mmol/L   CO2 23 20 - 29 mmol/L   Calcium 9.5 8.7 - 10.2 mg/dL   Reviewed labs with patient.         Assessment & Plan:   Problem List Items Addressed This Visit      Other   Familial hyperlipidemia    Other Visit Diagnoses    Annual physical exam    -  Primary     Discussed risks associated with uncontrolled hyperlipidemia. She did not start Repatha due to potential side effects. Patient will consider Wellchol and/or fenofibrate since she cannot take statins. Will contact office if she decides to start another medication.  Return in about 1 year (around 03/19/2019) for physical. Recommend follow up with Lorin Picket for migraines and hyperlipidemia.

## 2018-05-26 ENCOUNTER — Other Ambulatory Visit: Payer: Self-pay | Admitting: Family Medicine

## 2018-05-27 NOTE — Telephone Encounter (Signed)
Refill each x6 

## 2018-06-08 ENCOUNTER — Other Ambulatory Visit: Payer: Self-pay

## 2018-06-11 ENCOUNTER — Encounter: Payer: Self-pay | Admitting: Family Medicine

## 2018-06-11 ENCOUNTER — Other Ambulatory Visit: Payer: Self-pay | Admitting: Family Medicine

## 2018-06-11 MED ORDER — FAMOTIDINE 40 MG PO TABS: 40.0000 mg | ORAL_TABLET | Freq: Every day | ORAL | 3 refills | Status: DC

## 2018-06-13 NOTE — Telephone Encounter (Signed)
I recommended for this patient to cancel this prescription Patient aware Please send notification to pharmacy

## 2018-06-14 NOTE — Telephone Encounter (Signed)
[place] and cancelled this prescription. Pharmacist states that the script for Pepcid is ready.

## 2018-09-01 ENCOUNTER — Ambulatory Visit: Payer: BC Managed Care – PPO | Admitting: Family Medicine

## 2018-09-01 ENCOUNTER — Encounter: Payer: Self-pay | Admitting: Family Medicine

## 2018-09-01 VITALS — BP 110/72 | Temp 98.4°F | Ht 63.5 in | Wt 157.1 lb

## 2018-09-01 DIAGNOSIS — H00036 Abscess of eyelid left eye, unspecified eyelid: Secondary | ICD-10-CM

## 2018-09-01 DIAGNOSIS — H00034 Abscess of left upper eyelid: Secondary | ICD-10-CM

## 2018-09-01 MED ORDER — CEPHALEXIN 500 MG PO CAPS: 500.0000 mg | ORAL_CAPSULE | Freq: Four times a day (QID) | ORAL | 0 refills | Status: DC

## 2018-09-01 NOTE — Progress Notes (Signed)
Subjective:    Patient ID: Joyce Jacobs, female    DOB: 01-Feb-1961, 58 y.o.   MRN: [id number]  HPI  Patient is here today with complaints of left eye irritation. She states her left eye itches and is red, swollen, no discharge. She states it feels like there is something in it. She says she woke on Sunday with what she thought was a stye. She has been using warm compresses,using baby shampoo to clean it. She relates is been erythematous slightly tender.  Not draining anything.  It is more in the corner of the eye.  The eyeball itself is not irritated No double vision blurred vision  Review of Systems    Please see above no sinus symptoms sore throat fever chills sweats Objective:   Physical Exam Respiratory rate normal lungs are clear heart regular no murmurs neck no masses no adenopathy no sinus tenderness patient does have some tenderness along the medial aspect of the upper eyelid with some redness the eyelids were carefully flipped using gloves and clean wooden Q-tip no foreign body was noted       Assessment & Plan:  Possible stye versus early cellulitis upper eyelid medial aspect recommend Keflex 4 times daily for the next 7 days warm compresses if not doing better over the next for 5 days referral to ophthalmology no sign of any type of foreign body.  Do not find conjunctivitis.  I do not feel that this is seborrheic dermatitis currently.

## 2018-10-25 ENCOUNTER — Encounter: Payer: Self-pay | Admitting: Family Medicine

## 2018-10-27 NOTE — Telephone Encounter (Signed)
Nurses I read through the patient's message Please do the following Send in: Dymista, 1 spray each nostril daily this will help with allergies may do 1 vial with 6 refills Pataday, 1 drop each eye daily for allergies, 1 bottle, 6 refills  As for the migraines I cannot explain why the migraines got better with the changes she made.  I am glad the migraines are doing better.

## 2018-10-28 ENCOUNTER — Other Ambulatory Visit: Payer: Self-pay | Admitting: Family Medicine

## 2018-10-28 MED ORDER — AZELASTINE-FLUTICASONE 137-50 MCG/ACT NA SUSP: NASAL | 6 refills | Status: DC

## 2018-10-28 MED ORDER — OLOPATADINE HCL 0.2 % OP SOLN: OPHTHALMIC | 6 refills | Status: DC

## 2019-01-18 ENCOUNTER — Other Ambulatory Visit: Payer: Self-pay | Admitting: Family Medicine

## 2019-01-19 NOTE — Telephone Encounter (Signed)
This +2 refills needs office visit in the summer

## 2019-01-24 ENCOUNTER — Telehealth: Payer: Self-pay | Admitting: Family Medicine

## 2019-01-24 MED ORDER — FAMOTIDINE 20 MG PO TABS: 40.0000 mg | ORAL_TABLET | Freq: Every day | ORAL | 5 refills | Status: DC

## 2019-01-24 NOTE — Telephone Encounter (Signed)
Prescription sent electronically to pharmacy. 

## 2019-01-24 NOTE — Telephone Encounter (Signed)
Fax from pharmacy requesting to use 20 mg Famotidine tablets due to 40 mg being on backorder.  Pt original order is famotidine 40 mg tab take one tablet po once daily. Please advise. Thank you

## 2019-01-24 NOTE — Addendum Note (Signed)
Addended by: Dairl Ponder on: 01/24/2019 03:56 PM   Modules accepted: Orders

## 2019-01-24 NOTE — Telephone Encounter (Signed)
I would recommend famotidine 20 mg, 2 daily, #60, 5 refills The pharmacy will need to let the patient know why the change in the formulation

## 2019-11-09 ENCOUNTER — Encounter: Payer: Self-pay | Admitting: Family Medicine

## 2019-11-09 ENCOUNTER — Ambulatory Visit (INDEPENDENT_AMBULATORY_CARE_PROVIDER_SITE_OTHER): Payer: BC Managed Care – PPO | Admitting: Family Medicine

## 2019-11-09 ENCOUNTER — Other Ambulatory Visit: Payer: Self-pay

## 2019-11-09 DIAGNOSIS — Z79899 Other long term (current) drug therapy: Secondary | ICD-10-CM

## 2019-11-09 DIAGNOSIS — E785 Hyperlipidemia, unspecified: Secondary | ICD-10-CM | POA: Diagnosis not present

## 2019-11-09 MED ORDER — BACLOFEN 10 MG PO TABS: 10.0000 mg | ORAL_TABLET | Freq: Three times a day (TID) | ORAL | 5 refills | Status: DC | PRN

## 2019-11-09 MED ORDER — SUMATRIPTAN SUCCINATE 100 MG PO TABS: ORAL_TABLET | ORAL | 5 refills | Status: DC

## 2019-11-09 MED ORDER — OLOPATADINE HCL 0.2 % OP SOLN: OPHTHALMIC | 6 refills | Status: DC

## 2019-11-09 MED ORDER — ESTRADIOL 0.1 MG/GM VA CREA: TOPICAL_CREAM | VAGINAL | 5 refills | Status: DC

## 2019-11-09 MED ORDER — FLUTICASONE PROPIONATE 50 MCG/ACT NA SUSP: NASAL | 5 refills | Status: DC

## 2019-11-09 MED ORDER — FAMOTIDINE 20 MG PO TABS: 40.0000 mg | ORAL_TABLET | Freq: Every day | ORAL | 5 refills | Status: DC

## 2019-11-09 NOTE — Progress Notes (Signed)
Subjective:  Very nice patient  Patient ID: Joyce Jacobs, female    DOB: 29-Oct-1960, 59 y.o.   MRN: [id number] Very nice patient HPI Pt needing medication check. Pt states she usually talks about migraines with provider at her med check. Pt states that her migraines have become better. Pt taking meds as directed.  Headaches are doing dramatically better she is taking small amount of salt on a regular basis states this is helping she states that she had read a book regarding the latest on migraines Virtual Visit via Video Note  I connected with Joyce Jacobs on 11/09/19 at  3:00 PM EDT by a video enabled telemedicine application and verified that I am speaking with the correct person using two identifiers.  Location: Patient: home Provider: office   I discussed the limitations of evaluation and management by telemedicine and the availability of in person appointments. The patient expressed understanding and agreed to proceed.  History of Present Illness:    Observations/Objective:   Assessment and Plan:   Follow Up Instructions:    I discussed the assessment and treatment plan with the patient. The patient was provided an opportunity to ask questions and all were answered. The patient agreed with the plan and demonstrated an understanding of the instructions.   The patient was advised to call back or seek an in-person evaluation if the symptoms worsen or if the condition fails to improve as anticipated.  I provided 20 minutes of non-face-to-face time during this encounter.       Review of Systems  Constitutional: Negative for activity change, appetite change and fatigue.  HENT: Negative for congestion and rhinorrhea.   Respiratory: Negative for cough and shortness of breath.   Cardiovascular: Negative for chest pain and leg swelling.  Gastrointestinal: Negative for abdominal pain and diarrhea.  Endocrine: Negative for polydipsia and polyphagia.  Skin: Negative  for color change.  Neurological: Negative for dizziness and weakness.  Psychiatric/Behavioral: Negative for behavioral problems and confusion.       Objective:   Physical Exam  Tried 4 separate times to establish video ability unable to do so did telephone visit      Assessment & Plan:  1. Hyperlipidemia LDL goal <100 With the hyperlipidemia check lipid profile - Lipid Profile - Basic Metabolic Panel (BMET) - Hepatic function panel  2. High risk medication use Given her other medications check liver and kidney function - Lipid Profile - Basic Metabolic Panel (BMET) - Hepatic function panel Migraine stable doing much much better Occasional reflux uses Pepcid Does not tolerate statins Will do a wellness in the near future Patient encouraged to get mammogram done

## 2020-07-10 ENCOUNTER — Encounter: Payer: Self-pay | Admitting: Family Medicine

## 2020-11-02 ENCOUNTER — Other Ambulatory Visit (HOSPITAL_COMMUNITY): Payer: Self-pay | Admitting: Family Medicine

## 2020-11-02 ENCOUNTER — Other Ambulatory Visit: Payer: Self-pay

## 2020-11-02 ENCOUNTER — Ambulatory Visit (INDEPENDENT_AMBULATORY_CARE_PROVIDER_SITE_OTHER): Payer: BC Managed Care – PPO | Admitting: Family Medicine

## 2020-11-02 ENCOUNTER — Ambulatory Visit (HOSPITAL_COMMUNITY)
Admission: RE | Admit: 2020-11-02 | Discharge: 2020-11-02 | Disposition: A | Discharge status: Home / Self Care | Payer: BC Managed Care – PPO | Source: Ambulatory Visit | Attending: Family Medicine | Admitting: Family Medicine

## 2020-11-02 ENCOUNTER — Encounter: Payer: Self-pay | Admitting: Family Medicine

## 2020-11-02 VITALS — BP 120/76 | Temp 97.2°F | Ht 63.5 in | Wt 155.0 lb

## 2020-11-02 DIAGNOSIS — Z1231 Encounter for screening mammogram for malignant neoplasm of breast: Secondary | ICD-10-CM

## 2020-11-02 DIAGNOSIS — M79652 Pain in left thigh: Secondary | ICD-10-CM | POA: Diagnosis not present

## 2020-11-02 DIAGNOSIS — G5712 Meralgia paresthetica, left lower limb: Secondary | ICD-10-CM

## 2020-11-02 MED ORDER — DICLOFENAC SODIUM 50 MG PO TBEC: 50.0000 mg | DELAYED_RELEASE_TABLET | Freq: Two times a day (BID) | ORAL | 0 refills | Status: DC

## 2020-11-02 NOTE — Progress Notes (Signed)
Subjective:    Patient ID: Joyce Jacobs, female    DOB: 1961-07-11, 60 y.o.   MRN: [id number]  HPI  Patient arrives with left thigh pain- a burning or aching pain Burning pain discomfort in the left thigh region.  At times it is a stinging other times of the burn down as a deep ache occurs during the day but also in the evening time as well makes it difficult for her to get rest.  Denies any numbness down the leg.  Denies any weakness.  No known injury.  Has not had this before.  Has not tried anything for it.  Does not have any underlying health issues currently  The pain has gotten more constant in February.  See above Review of Systems See above    Objective:   Physical Exam  Lungs clear respiratory rate normal heart regular no murmurs low back no pain negative straight leg raise strength in both legs good      Assessment & Plan:  Meralgia paresthetica Recommend anti-inflammatory twice daily over the course the next 2 weeks X-ray of the side to make sure that the femur does not show any bone abnormality. Loosefitting clothing recommended Follow-up if progressive troubles or worse Warning signs discussed in detail If not improving consider gabapentin Do not feel Elavil would be a good choice for her.

## 2020-11-22 ENCOUNTER — Encounter: Payer: Self-pay | Admitting: Family Medicine

## 2020-11-25 NOTE — Telephone Encounter (Signed)
Nurses Please let Joyce Jacobs know that I did review over her message.  Given the ongoing pain and discomfort I believe this would benefit from further evaluation and work-up. I would recommend consultation with [facility] to see if this is meralgia paresthetica or the possibility of impingement of a nerve in her lower back causing this trouble.  They can do further evaluation and work-up.  For now she can continue the anti-inflammatories. Thanks-Dr. Nicki Reaper

## 2020-12-12 ENCOUNTER — Ambulatory Visit (HOSPITAL_COMMUNITY)
Admission: RE | Admit: 2020-12-12 | Discharge: 2020-12-12 | Disposition: A | Discharge status: Home / Self Care | Payer: BC Managed Care – PPO | Source: Ambulatory Visit | Attending: Family Medicine | Admitting: Family Medicine

## 2020-12-12 ENCOUNTER — Other Ambulatory Visit: Payer: Self-pay

## 2020-12-12 DIAGNOSIS — Z1231 Encounter for screening mammogram for malignant neoplasm of breast: Secondary | ICD-10-CM | POA: Insufficient documentation

## 2020-12-13 ENCOUNTER — Encounter: Payer: Self-pay | Admitting: Family Medicine

## 2020-12-13 ENCOUNTER — Ambulatory Visit (INDEPENDENT_AMBULATORY_CARE_PROVIDER_SITE_OTHER): Payer: BC Managed Care – PPO | Admitting: Family Medicine

## 2020-12-13 VITALS — BP 124/80 | HR 85 | Temp 97.3°F | Wt 159.0 lb

## 2020-12-13 DIAGNOSIS — J301 Allergic rhinitis due to pollen: Secondary | ICD-10-CM | POA: Diagnosis not present

## 2020-12-13 DIAGNOSIS — R21 Rash and other nonspecific skin eruption: Secondary | ICD-10-CM | POA: Diagnosis not present

## 2020-12-13 MED ORDER — PREDNISONE 20 MG PO TABS: 20.0000 mg | ORAL_TABLET | Freq: Two times a day (BID) | ORAL | 0 refills | Status: DC

## 2020-12-13 MED ORDER — FAMOTIDINE 20 MG PO TABS
20.0000 mg | ORAL_TABLET | Freq: Two times a day (BID) | ORAL | 0 refills | Status: DC
Start: 2020-12-13 — End: 2021-12-12

## 2020-12-13 MED ORDER — OLOPATADINE HCL 0.2 % OP SOLN: OPHTHALMIC | 2 refills | Status: DC

## 2020-12-13 NOTE — Patient Instructions (Signed)
Continue taking oral benadryl once per day at night time Add Pepcid by mouth twice per day  Allergic Rhinitis, Adult Allergic rhinitis is a reaction to allergens. Allergens are things that can cause an allergic reaction. This condition affects the lining inside the nose (mucous membrane). There are two types of allergic rhinitis:  Seasonal. This type is also called hay fever. It happens only during some times of the year.  Perennial. This type can happen at any time of the year. This condition cannot be spread from person to person (is not contagious). It can be mild, worse, or very bad. It can develop at any age and may be outgrown. What are the causes? This condition may be caused by:  Pollen from grasses, trees, and weeds.  Dust mites.  Smoke.  Mold.  Car fumes.  The pee (urine), spit, or dander of pets. Dander is dead skin cells from a pet.   What increases the risk? You are more likely to develop this condition if:  You have allergies in your family.  You have problems like allergies in your family. You may have: ? Swelling of parts of your eyes and eyelids. ? Asthma. This affects how you breathe. ? Long-term redness and swelling on your skin. ? Food allergies. What are the signs or symptoms? The main symptom of this condition is a runny or stuffy nose (nasal congestion). Other symptoms may include:  Sneezing or coughing.  Itching and tearing of your eyes.  Mucus that drips down the back of your throat (postnasal drip).  Trouble sleeping.  Feeling tired.  Headache.  Sore throat. How is this treated? There is no cure for this condition. You should avoid things that you are allergic to. Treatment can help to relieve symptoms. This may include:  Medicines that block allergy symptoms, such as corticosteroids or antihistamines. These may be given as a shot, nasal spray, or pill.  Avoiding things you are allergic to.  Medicines that give you bits of what you  are allergic to over time. This is called immunotherapy. It is done if other treatments do not help. You may get: ? Shots. ? Medicine under your tongue.  Stronger medicines, if other treatments do not help. Follow these instructions at home: Avoiding allergens Find out what things you are allergic to and avoid them. To do this, try these things:  If you get allergies any time of year: ? Replace carpet with wood, tile, or vinyl flooring. Carpet can trap pet dander and dust. ? Do not smoke. Do not allow smoking in your home. ? Change your heating and air conditioning filters at least once a month.  If you get allergies only some times of the year: ? Keep windows closed when you can. ? Plan things to do outside when pollen counts are lowest. Check pollen counts before you plan things to do outside. ? When you come indoors, change your clothes and shower before you sit on furniture or bedding.   If you are allergic to a pet: ? Keep the pet out of your bedroom. ? Vacuum, sweep, and dust often.   General instructions  Take over-the-counter and prescription medicines only as told by your doctor.  Drink enough fluid to keep your pee (urine) pale yellow.  Keep all follow-up visits as told by your doctor. This is important. Where to find more information  American Academy of Allergy, Asthma & Immunology: www.aaaai.org Contact a doctor if:  You have a fever.  You get  a cough that does not go away.  You make whistling sounds when you breathe (wheeze).  Your symptoms slow you down.  Your symptoms stop you from doing your normal things each day. Get help right away if:  You are short of breath. This symptom may be an emergency. Do not wait to see if the symptom will go away. Get medical help right away. Call your local emergency services (911 in the [place] Do not drive yourself to the hospital. Summary  Allergic rhinitis may be treated by taking medicines and avoiding things you  are allergic to.  If you have allergies only some of the year, keep windows closed when you can at those times.  Contact your doctor if you get a fever or a cough that does not go away. This information is not intended to replace advice given to you by your health care provider. Make sure you discuss any questions you have with your health care provider. Document Revised: 10/03/2019 Document Reviewed: 08/09/2019 Elsevier Patient Education  2021 [employer].

## 2020-12-13 NOTE — Progress Notes (Signed)
Patient ID: Joyce Jacobs, female    DOB: [birth year], 60 y.o.   MRN: [id number]   Chief Complaint  Patient presents with  . Allergic Reaction   Subjective:  CC: allergic reaction  This is not a new problem.  Presents today for an acute visit with a complaint of allergic reaction.  Reports that she has seen an allergist in the past, received allergy injections, symptoms controlled, once she moved to [facility], allergist told her she did not have allergies.  Denies fever, chills, chest pain, shortness of breath.  Denies any swelling associated with this erythematous rash.  No angioedema.  No new foods, soaps, detergents.  Endorses spending more time outside. Pt having allergic reaction on face and neck. This has happened in mid Jan mid Feb and this bout began on 12/11/20. No new items. Thought it was N95. No shortness of breath. Feels achy. Has been using Benadryl and has purchased Benadryl Cream and Calamine lotion. Also taking allergy med.   Medical History Joyce Jacobs has a past medical history of Allergy, Anemia, Asthma, Chronic sinusitis, Complication of anesthesia, GERD (gastroesophageal reflux disease), Headache(784.0), and Hyperlipidemia.   Outpatient Encounter Medications as of 12/13/2020  Medication Sig  . albuterol (PROVENTIL HFA;VENTOLIN HFA) 108 (90 Base) MCG/ACT inhaler Inhale 2 puffs into the lungs every 4 (four) hours as needed for wheezing.  . baclofen (LIORESAL) 10 MG tablet Take 1 tablet (10 mg total) by mouth 3 (three) times daily as needed. for muscle spams  . diclofenac (VOLTAREN) 50 MG EC tablet Take 1 tablet (50 mg total) by mouth 2 (two) times daily.  Marland Kitchen estradiol (ESTRACE) 0.1 MG/GM vaginal cream Use twice a week as directed prn  . fluticasone (FLONASE) 50 MCG/ACT nasal spray Two sprays each nostril daily  . naproxen sodium (ANAPROX) 220 MG tablet Take 440 mg by mouth daily as needed (headache).  . OVER THE COUNTER MEDICATION Take 1 tablet by mouth daily. Doterra  Supplements: Microplex MVP  . OVER THE COUNTER MEDICATION Take 1 tablet by mouth daily. Doterra Supplements: [employer]  . predniSONE (DELTASONE) 20 MG tablet Take 1 tablet (20 mg total) by mouth 2 (two) times daily with a meal.  . SUMAtriptan (IMITREX) 100 MG tablet TAKE ONE TABLET BY MOUTH AT FIRST SIGN OF MIGRANE. MAY REPEAT IN 2 HOURS, MAX 2 TABLETS PER 24 HOURS (MAY FILL EVERY 25 DAYS)  . [DISCONTINUED] famotidine (PEPCID) 20 MG tablet Take 2 tablets (40 mg total) by mouth daily.  . [DISCONTINUED] Olopatadine HCl (PATADAY) 0.2 % SOLN Insert 1 drop into each eye daily for allergies.  . famotidine (PEPCID) 20 MG tablet Take 1 tablet (20 mg total) by mouth 2 (two) times daily.  . Olopatadine HCl (PATADAY) 0.2 % SOLN Insert 1 drop into each eye daily for allergies.   No facility-administered encounter medications on file as of 12/13/2020.     Review of Systems  Constitutional: Negative for chills and fever.  Respiratory: Negative for shortness of breath.        No swelling associated with rash/erythematous rash. Occasional  itching.   Cardiovascular: Negative for chest pain.  Musculoskeletal: Positive for myalgias.       Feels achy     Vitals BP 124/80   Pulse 85   Temp (!) 97.3 F (36.3 C)   Wt 159 lb (72.1 kg)   LMP 03/05/2015   SpO2 95%   BMI 27.72 kg/m   Objective:   Physical Exam Vitals and nursing note  reviewed.  Constitutional:      Appearance: Normal appearance.  Cardiovascular:     Rate and Rhythm: Normal rate and regular rhythm.     Heart sounds: Normal heart sounds.  Pulmonary:     Effort: Pulmonary effort is normal.     Breath sounds: Normal breath sounds.  Skin:    General: Skin is warm and dry.     Comments: Scattered erythematous rash on side of face and neck.   Neurological:     General: No focal deficit present.     Mental Status: She is alert.  Psychiatric:        Behavior: Behavior normal.      Assessment and Plan   1.  Seasonal allergic rhinitis due to pollen - famotidine (PEPCID) 20 MG tablet; Take 1 tablet (20 mg total) by mouth 2 (two) times daily.  Dispense: 28 tablet; Refill: 0 - Olopatadine HCl (PATADAY) 0.2 % SOLN; Insert 1 drop into each eye daily for allergies.  Dispense: 2.5 mL; Refill: 2  2. Rash and other nonspecific skin eruption - famotidine (PEPCID) 20 MG tablet; Take 1 tablet (20 mg total) by mouth 2 (two) times daily.  Dispense: 28 tablet; Refill: 0 - predniSONE (DELTASONE) 20 MG tablet; Take 1 tablet (20 mg total) by mouth 2 (two) times daily with a meal.  Dispense: 10 tablet; Refill: 0 - Ambulatory referral to Allergy   Erythematous raised rash noted on the side of face-pictures show widespread erythema over face, neck, forehead.  Endorses pruritus.  Will treat with Pepcid twice per day for 14 days, short course prednisone twice per day for 5 days.  She will continue taking oral Benadryl at bedtime.  Will send to allergist.   Agrees with plan of care discussed today. Understands warning signs to seek further care: chest pain, shortness of breath, any significant change in health.  Understands to follow-up if symptoms do not improve or worsen.  Referral sent for allergist, she will wait till summer after school year is over.   Joyce Olive, NP 12/13/2020

## 2021-02-06 ENCOUNTER — Other Ambulatory Visit: Payer: Self-pay

## 2021-02-06 ENCOUNTER — Encounter: Payer: Self-pay | Admitting: Allergy & Immunology

## 2021-02-06 ENCOUNTER — Ambulatory Visit: Payer: BC Managed Care – PPO | Admitting: Allergy & Immunology

## 2021-02-06 VITALS — BP 112/66 | HR 70 | Temp 98.1°F | Resp 18 | Ht 63.75 in | Wt 158.8 lb

## 2021-02-06 DIAGNOSIS — J4520 Mild intermittent asthma, uncomplicated: Secondary | ICD-10-CM

## 2021-02-06 DIAGNOSIS — K9049 Malabsorption due to intolerance, not elsewhere classified: Secondary | ICD-10-CM

## 2021-02-06 DIAGNOSIS — J3089 Other allergic rhinitis: Secondary | ICD-10-CM | POA: Diagnosis not present

## 2021-02-06 DIAGNOSIS — J302 Other seasonal allergic rhinitis: Secondary | ICD-10-CM

## 2021-02-06 DIAGNOSIS — L239 Allergic contact dermatitis, unspecified cause: Secondary | ICD-10-CM

## 2021-02-06 MED ORDER — AZELASTINE HCL 0.1 % NA SOLN: NASAL | 5 refills | Status: DC

## 2021-02-06 MED ORDER — EPINEPHRINE 0.3 MG/0.3ML IJ SOAJ: 0.3000 mg | Freq: Once | INTRAMUSCULAR | 1 refills | Status: AC

## 2021-02-06 NOTE — Patient Instructions (Signed)
1. Seasonal and perennial allergic rhinitis - Testing today showed: grasses, ragweed, weeds, indoor molds, outdoor molds, dust mites, cat, dog, and cockroach - Copy of test results provided.  - Avoidance measures provided. - Continue with: Allegra (fexofenadine) 180mg  table once daily and Flonase (fluticasone) one spray per nostril on Mon/Wed/Fri - Start taking: Astelin (azelastine) 2 sprays per nostril 1-2 times daily as needed - You can use an extra dose of the antihistamine, if needed, for breakthrough symptoms.  - Consider nasal saline rinses 1-2 times daily to remove allergens from the nasal cavities as well as help with mucous clearance (this is especially helpful to do before the nasal sprays are given) - Consider allergy shots as a means of long-term control. - Allergy shots "re-train" and "reset" the immune system to ignore environmental allergens and decrease the resulting immune response to those allergens (sneezing, itchy watery eyes, runny nose, nasal congestion, etc).    - Allergy shots improve symptoms in 75-85% of patients.  - Make an appointment in 2-3 weeks to start allergy shots.   2. Allergic contact dermatitis - We could consider patch testing in the future. - We can even do testing to various cosmetics that you bring in.  - Consider that for the future.  3. No follow-ups on file.    Please inform us of any Emergency Department visits, hospitalizations, or changes in symptoms. Call us before going to the ED for breathing or allergy symptoms since we might be able to fit you in for a sick visit. Feel free to contact us anytime with any questions, problems, or concerns.  It was a pleasure to meet you today!  Websites that have reliable patient information: 1. American Academy of Asthma, Allergy, and Immunology: www.aaaai.org 2. Food Allergy Research and Education (FARE): foodallergy.org 3. Mothers of Asthmatics: http://www.asthmacommunitynetwork.org 4. American College  of Allergy, Asthma, and Immunology: www.acaai.org   COVID-19 Vaccine Information can be found at: ShippingScam.co.uk For questions related to vaccine distribution or appointments, please email [email or web address] or call [phone].   We realize that you might be concerned about having an allergic reaction to the COVID19 vaccines. To help with that concern, WE ARE OFFERING THE COVID19 VACCINES IN OUR OFFICE! Ask the front desk for dates!     "Like" [place] on Facebook and Instagram for our latest updates!      A healthy democracy works best when [employer] participate! Make sure you are registered to vote! If you have moved or changed any of your contact information, you will need to get this updated before voting!  In some cases, you MAY be able to register to vote online: [email or web address]

## 2021-02-06 NOTE — Progress Notes (Signed)
NEW PATIENT  Date of Service/Encounter:  02/07/21  Consult requested by: Kathyrn Drown, MD   Assessment:   Seasonal and perennial allergic rhinitis (grasses, ragweed, weeds, indoor molds, outdoor molds, dust mites, cat, dog, and cockroach)  Allergic contact dermatitis, - consider patch testing in the future  Food intolernace (wheat) - with GI upset   Mild intermittent asthma, uncomplicated  Plan/Recommendations:    1. Seasonal and perennial allergic rhinitis - Testing today showed: grasses, ragweed, weeds, indoor molds, outdoor molds, dust mites, cat, dog, and cockroach - Copy of test results provided.  - Avoidance measures provided. - Continue with: Allegra (fexofenadine) 180mg  table once daily and Flonase (fluticasone) one spray per nostril on Mon/Wed/Fri - Start taking: Astelin (azelastine) 2 sprays per nostril 1-2 times daily as needed - You can use an extra dose of the antihistamine, if needed, for breakthrough symptoms.  - Consider nasal saline rinses 1-2 times daily to remove allergens from the nasal cavities as well as help with mucous clearance (this is especially helpful to do before the nasal sprays are given) - Consider allergy shots as a means of long-term control. - Allergy shots "re-train" and "reset" the immune system to ignore environmental allergens and decrease the resulting immune response to those allergens (sneezing, itchy watery eyes, runny nose, nasal congestion, etc).    - Allergy shots improve symptoms in 75-85% of patients.  - Make an appointment in 2-3 weeks to start allergy shots.   2. Allergic contact dermatitis - We could consider patch testing in the future. - We can even do testing to various cosmetics that you bring in.  - Consider that for the future.  3. Return in about 8 weeks (around 04/03/2021).    This note in its entirety was forwarded to the Provider who requested this consultation.  Subjective:   Joyce Jacobs is a 60  y.o. 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  Joyce Jacobs was referred by Kathyrn Drown, MD.     Joyce Jacobs is a 60 y.o. female presenting for an evaluation of allergies and asthma .    Asthma/Respiratory Symptom History: She  has albuterol that she uses when she is sick. She only has issues when she is sick. She estimates that she gets sick very rarely. She would get sinusitis or bronchitis every spring and fall, but she doesn ot think that she has had antibiotics in five years or so. She does not remember the last time she was on Augmentin. She does not get prednisone for her breathing often at all, but that has largely improved. She was last on prednisone in April for an allergic reaction. She does not cough at night, although occasionally with the drainage. This is not a constant thing.   Allergic  Rhinitis Symptom History: She has been on shots on 3 different occasions. She had sinus infections with wheezing and asthma at that time. She took shots until her 76s when she moved to [place]. 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  She then lived in [place] in [place] and was on shots for five years. Then she moved to [place]. She was [employer] and  was on shots. She moved here in 2003 and saw Dr. Ishmael Holter in 2009 or so. 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   Contact Dermatitis Symptom History: She most recently started having allergic reactions. She will have redness of her face with eye puffiness. It happened in January (unknown trigger) and then February (flew to [facility] and maybe related to a sunblock) and then in April (definitely sunblock related. Typically this happens in the spring time and mostly in March. She has not really found any kind of trigger. She has changed lotions and makeups and she can never figure it out. She is concerned with a contact dermatitis.   Otherwise, there is no history of other atopic diseases, including asthma, food allergies, drug allergies, environmental allergies, or stinging insect allergies. There is no significant infectious history. Vaccinations are up to date.    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   Migraine headache 04/06/2013   Achilles tendinitis 04/06/2013   Allergic rhinitis 11/26/2012    Medication List:  Allergies as of 02/06/2021       Reactions   Codeine Nausea Only   Vicodin [hydrocodone-acetaminophen] Itching, Rash   Crestor [rosuvastatin Calcium] Other (See Comments)   Muscle aches    Lipitor [atorvastatin]    Constipation, fatigue, achy   Pravachol [pravastatin Sodium] Other (See Comments)   Drowsiness and memory trouble   Singulair [montelukast Sodium] Other (See Comments)   Headache   Imipramine Other (See Comments)    Drowsiness   Topamax [topiramate] Other (See Comments)   Drowsiness, stuttering and memory trouble        Medication List        Accurate as of February 06, 2021 11:59 PM. If you have any questions, ask your nurse or doctor.          STOP taking these medications    diclofenac 50 MG EC tablet Commonly known as: VOLTAREN Stopped by: Valentina Shaggy, MD   predniSONE 20 MG tablet Commonly known as: DELTASONE Stopped by: Valentina Shaggy, MD       TAKE these medications    albuterol 108 (90 Base) MCG/ACT inhaler Commonly known as: VENTOLIN HFA Inhale 2 puffs into the lungs every 4 (four) hours as needed for wheezing.   azelastine 0.1 % nasal spray Commonly known as: ASTELIN 2 sprays per nostril 1-2 times daily as needed. Started by: Valentina Shaggy, MD   baclofen 10 MG tablet Commonly known as: LIORESAL Take 1 tablet (10 mg total) by mouth 3 (three) times daily as needed. for muscle spams   dextromethorphan-guaiFENesin 30-600 MG 12hr tablet Commonly known as: MUCINEX DM Take 2 tablets by mouth as needed  for cough.   EPINEPHrine 0.3 mg/0.3 mL Soaj injection Commonly known as: EpiPen 2-Pak Inject 0.3 mg into the muscle once for 1 dose. Started by: Valentina Shaggy, MD   estradiol 0.1 MG/GM vaginal cream Commonly known as: ESTRACE Use twice a week as directed prn   famotidine 20 MG tablet Commonly known as: Pepcid Take 1 tablet (20 mg total) by mouth 2 (two) times daily.   fexofenadine 180 MG tablet Commonly known as: ALLEGRA Take 180 mg by mouth daily.   fluticasone 50 MCG/ACT nasal spray Commonly known as: FLONASE Two sprays each nostril daily   naproxen sodium 220 MG tablet Commonly known as: ALEVE Take 440 mg by mouth daily as needed (headache).   Olopatadine HCl 0.2 % Soln Commonly known as: Pataday Insert 1 drop into each eye daily for allergies.   ondansetron 8 MG tablet Commonly known as: ZOFRAN Take 8 mg by mouth every  8 (eight) hours as needed for nausea or vomiting.   OVER THE COUNTER MEDICATION Take 1 tablet by mouth daily. Doterra Supplements: Microplex MVP   OVER THE COUNTER MEDICATION Take 1 tablet by mouth daily. Doterra Supplements: Zeomega Essential Oils   SUMAtriptan 100 MG tablet Commonly known as: IMITREX TAKE ONE TABLET BY MOUTH AT FIRST SIGN OF MIGRANE. MAY REPEAT IN 2 HOURS, MAX 2 TABLETS PER 24 HOURS (MAY FILL EVERY 25 DAYS)        Birth History: non-contributory  Developmental History: non-contributory  Past Surgical History: Past Surgical History:  Procedure Laterality Date   ADENOIDECTOMY     CESAREAN SECTION     times three   COLONOSCOPY  05/12/2011   Procedure: COLONOSCOPY;  Surgeon: Daneil Dolin, MD;  Location: [facility];  Service: Endoscopy;  Laterality: N/A;  8:30 AM   DILATION AND CURETTAGE OF UTERUS     ENDOMETRIAL ABLATION     FACIAL FRACTURE SURGERY  08/26/1987   broken nose and cheek bone   HEAD & NECK WOUND REPAIR / CLOSURE     times 5   ROBOTIC ASSISTED TOTAL HYSTERECTOMY WITH BILATERAL SALPINGO OOPHERECTOMY Bilateral 03/05/2015   Procedure: ROBOTIC ASSISTED TOTAL HYSTERECTOMY WITH BILATERAL SALPINGO OOPHORECTOMY;  Surgeon: Brien Few, MD;  Location: [facility];  Service: Gynecology;  Laterality: Bilateral;   SINOSCOPY     SKIN GRAFT Right    Eardrum   TONSILLECTOMY AND ADENOIDECTOMY     TYMPANOPLASTY  [id number],[id number]   TYMPANOSTOMY TUBE PLACEMENT       Family History: Family History  Problem Relation Age of Onset   Hypertension Mother    Diabetes Mother    Heart attack Father    Hyperlipidemia Father    Cancer Maternal Grandmother        Ovarian     Social History: Kadra lives at home with her husbnad.  They live in a house that is 62 years old.  There is laminate area rugs throughout the home.  They have carpeting in the bedrooms.  They have a cat inside of the home.  There are dust mite covers on the bedding.  There is no tobacco exposure.   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  She is not exposed to fumes, chemicals, or dust in her hobbies.  She does use a HEPA filter.  He does not live near an interstate or industrial area.   Review of Systems  Constitutional: Negative.  Negative for chills, fever,  malaise/fatigue and weight loss.  HENT:  Positive for congestion. Negative for ear discharge, ear pain and sinus pain.   Eyes:  Negative for pain, discharge and redness.  Respiratory:  Positive for cough and wheezing. Negative for sputum production and shortness of breath.   Cardiovascular: Negative.  Negative for chest pain and palpitations.  Gastrointestinal:  Negative for abdominal pain, constipation, diarrhea, heartburn, nausea and vomiting.  Skin: Negative.  Negative for itching and rash.  Neurological:  Negative for dizziness and headaches.  Endo/Heme/Allergies:  Positive for environmental allergies. Does not bruise/bleed easily.      Objective:   Blood pressure 112/66, pulse 70, temperature 98.1 F (36.7 C), temperature source Temporal, resp. rate 18, height 5' 3.75" (1.619 m), weight 158 lb 12.8 oz (72 kg), last menstrual period 03/05/2015, SpO2 96 %. Body mass index is 27.47 kg/m.   Physical Exam:   Physical Exam Vitals reviewed.  Constitutional:      Appearance: She is well-developed.     Comments: Very pleasant.  HENT:     Head: Normocephalic and atraumatic.     Right Ear: Tympanic membrane, ear canal and external ear normal. No drainage, swelling or tenderness. Tympanic membrane is not injected, scarred, erythematous, retracted or bulging.     Left Ear: Tympanic membrane, ear canal and external ear normal. No drainage, swelling or tenderness. Tympanic membrane is not injected, scarred, erythematous, retracted or bulging.     Nose: No nasal deformity, septal deviation, mucosal edema or rhinorrhea.     Right Turbinates:  Enlarged, swollen and pale.     Left Turbinates: Enlarged, swollen and pale.     Right Sinus: No maxillary sinus tenderness or frontal sinus tenderness.     Left Sinus: No maxillary sinus tenderness or frontal sinus tenderness.     Mouth/Throat:     Mouth: Mucous membranes are not pale and not dry.     Pharynx: Uvula midline.  Eyes:     General: Allergic shiner present.        Right eye: No discharge.        Left eye: No discharge.     Conjunctiva/sclera: Conjunctivae normal.     Right eye: Right conjunctiva is not injected. No chemosis.    Left eye: Left conjunctiva is not injected. No chemosis.    Pupils: Pupils are equal, round, and reactive to light.     Comments: No conjunctivitis.  Cardiovascular:     Rate and Rhythm: Normal rate and regular rhythm.     Heart sounds: Normal heart sounds.  Pulmonary:     Effort: Pulmonary effort is normal. No tachypnea, accessory muscle usage or respiratory distress.     Breath sounds: Normal breath sounds. No wheezing, rhonchi or rales.     Comments: Moving air well in all lung fields.  No increased work of breathing. Chest:     Chest wall: No tenderness.  Abdominal:     Tenderness: There is no abdominal tenderness. There is no guarding or rebound.  Lymphadenopathy:     Head:     Right side of head: No submandibular, tonsillar or occipital adenopathy.     Left side of head: No submandibular, tonsillar or occipital adenopathy.     Cervical: No cervical adenopathy.  Skin:    General: Skin is warm.     Capillary Refill: Capillary refill takes less than 2 seconds.     Coloration: Skin is not pale.     Findings: No abrasion, erythema, petechiae or rash. Rash  is not papular, urticarial or vesicular.     Comments: No eczematous or urticarial lesions noted.  Neurological:     Mental Status: She is alert.     Diagnostic studies:    Spirometry: results normal (FEV1: 2.14/87%, FVC: 2.37/76%, FEV1/FVC: 90%).    Spirometry consistent with  normal pattern.   Allergy Studies:     Airborne Adult Perc - 02/06/21 1004     Time Antigen Placed 1004    Allergen Manufacturer Greer    Location Back    Number of Test 59    Panel 1 Select    1. Control-Buffer 50% Glycerol Negative    2. Control-Histamine 1 mg/ml 3+    3. Albumin saline Negative    4. Reeves Negative    5. [place] Negative    6. Johnson Negative    7. Fairview Blue Negative    8. Meadow Fescue Negative    9. Perennial Rye Negative    10. Sweet Vernal Negative    11. Timothy Negative    12. Cocklebur Negative    13. Burweed Marshelder Negative    14. Ragweed, short Negative    15. Ragweed, Giant Negative    16. Plantain,  English Negative    17. Lamb's Quarters Negative    18. Sheep Sorrell Negative    19. Rough Pigweed Negative    20. Marsh Elder, Rough Negative    21. Mugwort, Common Negative    22. Ash mix Negative    23. Birch mix Negative    24. Beech American Negative    25. Box, Elder Negative    26. Cedar, red Negative    27. Cottonwood, [place] Negative    28. Elm mix Negative    29. Hickory Negative    30. Maple mix Negative    31. Oak, [place] mix Negative    32. Pecan Pollen Negative    33. Pine mix Negative    34. Sycamore Eastern Negative    35. Gautier, Black Pollen Negative    36. Alternaria alternata Negative    37. Cladosporium Herbarum Negative    38. Aspergillus mix Negative    39. Penicillium mix Negative    40. Bipolaris sorokiniana (Helminthosporium) Negative    41. Drechslera spicifera (Curvularia) Negative    42. Mucor plumbeus Negative    43. Fusarium moniliforme Negative    44. Aureobasidium pullulans (pullulara) Negative    45. Rhizopus oryzae Negative    46. Botrytis cinera Negative    47. Epicoccum nigrum Negative    48. Phoma betae Negative    49. Candida Albicans Negative    50. Trichophyton mentagrophytes Negative    51. Mite, D Farinae  5,000 AU/ml 2+    52. Mite, D Pteronyssinus  5,000 AU/ml Negative     53. Cat Hair 10,000 BAU/ml 2+    54.  Dog Epithelia Negative    55. Mixed Feathers Negative    56. Horse Epithelia Negative    57. Cockroach, German Negative    58. Mouse Negative    59. Tobacco Leaf Negative             Intradermal - 02/06/21 1026     Time Antigen Placed 1026    Allergen Manufacturer Greer    Location Arm    Number of Test 13    Intradermal Select    Control Negative    Guatemala Negative    Johnson 1+    7 Grass 1+    Ragweed  mix 3+    Weed mix 3+    Tree mix Negative    Mold 1 Negative    Mold 2 3+    Mold 3 3+    Mold 4 3+    Dog 3+    Cockroach 3+             Allergy testing results were read and interpreted by myself, documented by clinical staff.         Salvatore Marvel, MD [facility]

## 2021-02-07 ENCOUNTER — Encounter: Payer: Self-pay | Admitting: Allergy & Immunology

## 2021-02-07 DIAGNOSIS — L239 Allergic contact dermatitis, unspecified cause: Secondary | ICD-10-CM | POA: Insufficient documentation

## 2021-02-07 DIAGNOSIS — K9049 Malabsorption due to intolerance, not elsewhere classified: Secondary | ICD-10-CM | POA: Insufficient documentation

## 2021-02-07 DIAGNOSIS — J4520 Mild intermittent asthma, uncomplicated: Secondary | ICD-10-CM | POA: Insufficient documentation

## 2021-02-07 DIAGNOSIS — J302 Other seasonal allergic rhinitis: Secondary | ICD-10-CM | POA: Insufficient documentation

## 2021-02-07 DIAGNOSIS — L235 Allergic contact dermatitis due to other chemical products: Secondary | ICD-10-CM | POA: Insufficient documentation

## 2021-02-11 NOTE — Progress Notes (Signed)
VIALS EXP 02-11-22

## 2021-02-11 NOTE — Progress Notes (Signed)
Aeroallergen Immunotherapy   Ordering Provider: Dr. Salvatore Marvel   Patient Details  Name: Joyce Jacobs  MRN: [id number]  Date of Birth: 09/30/1960   Order 1 of 2   Vial Label: G/W/C/D/DM   0.3 ml (Volume)  BAU Concentration -- 7 Grass Mix* 100,000 ([address] [place], [place], Pelham, Perennial Rye, RedTop, Sweet Vernal, Timothy)  0.2 ml (Volume)  1:20 Concentration -- Johnson  0.5 ml (Volume)  1:20 Concentration -- Weed Mix*  0.5 ml (Volume)  1:10 Concentration -- Cat Hair  0.5 ml (Volume)  1:10 Concentration -- Dog Epithelia  0.5 ml (Volume)   AU Concentration -- Mite Mix (DF 5,000 & DP 5,000)    2.5  ml Extract Subtotal  2.5  ml Diluent  5.0  ml Maintenance Total   Schedule:  A   Blue Vial (1:100,000): Schedule A (10 doses)  Yellow Vial (1:10,000): Schedule A (10 doses)  Green Vial (1:1,000): Schedule A (10 doses)  Red Vial (1:100): Schedule A (10 doses)   Special Instructions: none

## 2021-02-11 NOTE — Progress Notes (Signed)
Aeroallergen Immunotherapy   Ordering Provider: Dr. Salvatore Marvel   Patient Details  Name: Joyce Jacobs  MRN: [id number]  Date of Birth: 1961/05/26   Order 2 of 2   Vial Label: RW/Molds/CR   0.3 ml (Volume)  1:20 Concentration -- Ragweed Mix  0.2 ml (Volume)  1:10 Concentration -- Aspergillus mix  0.2 ml (Volume)  1:10 Concentration -- Penicillium mix  0.2 ml (Volume)  1:20 Concentration -- Bipolaris sorokiniana  0.2 ml (Volume)  1:20 Concentration -- Drechslera spicifera  0.2 ml (Volume)  1:10 Concentration -- Mucor plumbeus  0.2 ml (Volume)  1:10 Concentration -- Fusarium moniliforme  0.2 ml (Volume)  1:40 Concentration -- Aureobasidium pullulans  0.2 ml (Volume)  1:10 Concentration -- Rhizopus oryzae  0.3 ml (Volume)  1:20 Concentration -- Cockroach, German    2.2  ml Extract Subtotal  2.8  ml Diluent  5.0  ml Maintenance Total   Schedule:  A   Blue Vial (1:100,000): Schedule A (10 doses)  Yellow Vial (1:10,000): Schedule A (10 doses)  Green Vial (1:1,000): Schedule A (10 doses)  Red Vial (1:100): Schedule A (10 doses)   Special Instructions: none

## 2021-02-12 DIAGNOSIS — J3089 Other allergic rhinitis: Secondary | ICD-10-CM | POA: Diagnosis not present

## 2021-02-13 DIAGNOSIS — J302 Other seasonal allergic rhinitis: Secondary | ICD-10-CM

## 2021-02-27 ENCOUNTER — Ambulatory Visit (INDEPENDENT_AMBULATORY_CARE_PROVIDER_SITE_OTHER): Payer: BC Managed Care – PPO

## 2021-02-27 ENCOUNTER — Other Ambulatory Visit: Payer: Self-pay

## 2021-02-27 DIAGNOSIS — J309 Allergic rhinitis, unspecified: Secondary | ICD-10-CM

## 2021-02-27 NOTE — Progress Notes (Signed)
Immunotherapy   Patient Details  Name: Joyce Jacobs MRN: [id number] Date of Birth: 09-Oct-1960  02/27/2021  [name] started injections for  G-W-C-D-DM and RW-Molds-CR. Patient received 0.05 of oth blue vials with an expiration of 02/11/2022. Patient waited 30 minutes with no problems. Following schedule: A  Frequency:1 time per week Epi-Pen:Epi-Pen Available  Consent signed and patient instructions given.   [name] 02/27/2021, 1:37 PM

## 2021-02-28 ENCOUNTER — Other Ambulatory Visit: Payer: Self-pay | Admitting: Family Medicine

## 2021-03-15 ENCOUNTER — Ambulatory Visit (INDEPENDENT_AMBULATORY_CARE_PROVIDER_SITE_OTHER): Payer: BC Managed Care – PPO

## 2021-03-15 DIAGNOSIS — J309 Allergic rhinitis, unspecified: Secondary | ICD-10-CM

## 2021-03-22 ENCOUNTER — Ambulatory Visit (INDEPENDENT_AMBULATORY_CARE_PROVIDER_SITE_OTHER): Payer: BC Managed Care – PPO

## 2021-03-22 ENCOUNTER — Encounter: Payer: Self-pay | Admitting: *Deleted

## 2021-03-22 DIAGNOSIS — J309 Allergic rhinitis, unspecified: Secondary | ICD-10-CM | POA: Diagnosis not present

## 2021-03-27 ENCOUNTER — Ambulatory Visit (INDEPENDENT_AMBULATORY_CARE_PROVIDER_SITE_OTHER): Payer: BC Managed Care – PPO

## 2021-03-27 DIAGNOSIS — J309 Allergic rhinitis, unspecified: Secondary | ICD-10-CM

## 2021-04-02 NOTE — Patient Instructions (Addendum)
1. Seasonal and perennial allergic rhinitis -Continue avoidance measures  - Continue with: Allegra (fexofenadine) '180mg'$  table once daily, Flonase (fluticasone) one spray per nostril on Mon/Wed/Fri, and INCREASE Astelin (azelastine) 2 sprays per nostril 1-2 times daily as needed - You can use an extra dose of the antihistamine, if needed, for breakthrough symptoms.  - Consider nasal saline rinses 1-2 times daily to remove allergens from the nasal cavities as well as help with mucous clearance (this is especially helpful to do before the nasal sprays are given) -Continue allergy injections per protocol and have access to epinephrine autoinjector device.  2. Allergic contact dermatitis - We could consider patch testing in the future. - We can even do testing to various cosmetics that you bring in.  - Consider that for the future.  3. Mild intermittent asthma May use albuterol 2 puffs every 4-6 hours as needed for cough, wheeze, tightness in chest, or shortness of breath. We will refill this for you.  Please let us know if this treatment plan is not working well for you Schedule a follow-up appointment in 3 months or sooner if needed

## 2021-04-03 ENCOUNTER — Ambulatory Visit: Payer: Self-pay | Admitting: *Deleted

## 2021-04-03 ENCOUNTER — Ambulatory Visit (INDEPENDENT_AMBULATORY_CARE_PROVIDER_SITE_OTHER): Payer: BC Managed Care – PPO | Admitting: Family

## 2021-04-03 ENCOUNTER — Other Ambulatory Visit: Payer: Self-pay

## 2021-04-03 ENCOUNTER — Encounter: Payer: Self-pay | Admitting: Family

## 2021-04-03 VITALS — BP 122/78 | HR 68 | Resp 17

## 2021-04-03 DIAGNOSIS — J309 Allergic rhinitis, unspecified: Secondary | ICD-10-CM

## 2021-04-03 DIAGNOSIS — K9049 Malabsorption due to intolerance, not elsewhere classified: Secondary | ICD-10-CM | POA: Diagnosis not present

## 2021-04-03 DIAGNOSIS — J302 Other seasonal allergic rhinitis: Secondary | ICD-10-CM

## 2021-04-03 DIAGNOSIS — J4520 Mild intermittent asthma, uncomplicated: Secondary | ICD-10-CM | POA: Diagnosis not present

## 2021-04-03 DIAGNOSIS — L239 Allergic contact dermatitis, unspecified cause: Secondary | ICD-10-CM

## 2021-04-03 MED ORDER — ALBUTEROL SULFATE HFA 108 (90 BASE) MCG/ACT IN AERS: INHALATION_SPRAY | RESPIRATORY_TRACT | 1 refills | Status: DC

## 2021-04-03 NOTE — Progress Notes (Signed)
[address] [place] [address] Dept: [phone]  FOLLOW UP NOTE  Patient ID: Joyce Jacobs, female    DOB: 1961/05/11  Age: 60 y.o. 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  She was last seen on February 07, 2021 by Dr. Dellis Anes.  Seasonal and perennial allergic rhinitis is reported as not well controlled with Allegra 180 mg once a day, fluticasone nasal spray 1 spray per nostril Monday, Wednesday, Friday, and azelastine nasal spray 2 sprays each nostril once a day.  She also is in the buildup process with her allergy injections.  She denies any large local reactions and reports that her epinephrine autoinjector device is up-to-date.  She reports a little bit of nasal congestion, a lot of postnasal drip, and a little bit of clear rhinorrhea.  She has not had any sinus infections since we last saw her.  She reports that since July 26 she has had off-and-on feelings of being tired, achy, and drainage.  She has tested herself for COVID-19 twice and have been negative.  Her most recent testing was this last Tuesday.  There has also been a few times where she will feel sick after drinking water.  She feels that this may be due to drainage.  She reports that last night she had a lot of saliva and spit it up and felt better.  Also there was another time when she drink water and it made her sick and she threw up.  She has never had this problem before.  Allergic contact dermatitis is reported as moderately controlled.  She reports that 1 day last week her face became red but she did not have any swelling.  She reports that the redness went away on its own without any medications.  Mild intermittent  asthma is reported as controlled.  She denies any coughing, wheezing, tightness in her chest, and shortness of breath.  She has not had to use her albuterol inhaler since we last saw her.  She is requesting a refill of albuterol to have on hand.   Drug Allergies:  Allergies  Allergen Reactions   Codeine Nausea Only   Vicodin [Hydrocodone-Acetaminophen] Itching and Rash   Crestor [Rosuvastatin Calcium] Other (See Comments)    Muscle aches    Lipitor [Atorvastatin]     Constipation, fatigue, achy   Pravachol [Pravastatin Sodium] Other (See Comments)    Drowsiness and memory trouble   Singulair [Montelukast Sodium] Other (See Comments)    Headache   Imipramine Other (See Comments)    Drowsiness   Topamax [Topiramate] Other (See Comments)    Drowsiness, stuttering and memory trouble    Review of Systems: Review of Systems  Constitutional:  Negative for chills and fever.  HENT:         Reports a little bit of nasal congestion, a lot of postnasal drip, and a little bit of clear rhinorrhea.  Eyes:        Reports occasional itchy watery eyes, but reports that there is no need to buy eyedrops yet  Respiratory:  Negative for cough, shortness of breath and wheezing.   Cardiovascular:  Negative for chest pain and palpitations.  Gastrointestinal:        Reports occasional heartburn and reflux.  Takes Pepcid once a day on most days and this helps  Genitourinary:  Negative for dysuria.  Skin:  Negative for itching and rash.  Neurological:  Negative for headaches.  Endo/Heme/Allergies:  Positive for environmental allergies.    Physical Exam: BP 122/78   Pulse 68   Resp 17   LMP 03/05/2015   SpO2 98%    Physical Exam Constitutional:      Appearance: Normal appearance.  HENT:     Head: Normocephalic and atraumatic.     Right Ear: Tympanic membrane, ear canal and external ear normal.     Left Ear: Tympanic membrane, ear canal and external ear normal.     Mouth/Throat:     Mouth:  Mucous membranes are moist.     Pharynx: Oropharynx is clear.  Eyes:     Conjunctiva/sclera: Conjunctivae normal.  Cardiovascular:     Rate and Rhythm: Regular rhythm.     Heart sounds: Normal heart sounds.  Pulmonary:     Effort: Pulmonary effort is normal.     Breath sounds: Normal breath sounds.     Comments: Lungs clear to auscultation Musculoskeletal:     Cervical back: Neck supple.  Skin:    General: Skin is warm.  Neurological:     Mental Status: She is alert and oriented to person, place, and time.  Psychiatric:        Mood and Affect: Mood normal.        Behavior: Behavior normal.        Thought Content: Thought content normal.        Judgment: Judgment normal.    Diagnostics: None  Assessment and Plan: 1. Seasonal and perennial allergic rhinitis   2. Allergic contact dermatitis, unspecified trigger   3. Food intolerance   4. Mild intermittent asthma, uncomplicated     Meds ordered this encounter  Medications   albuterol (VENTOLIN HFA) 108 (90 Base) MCG/ACT inhaler    Sig: inhale 2 puffs every 4-6 hours as needed for cough, wheeze, tightness in chest, shortness of breath    Dispense:  1 each    Refill:  1     Patient Instructions  1. Seasonal and perennial allergic rhinitis -Continue avoidance measures  - Continue with: Allegra (fexofenadine) 180mg  table once daily, Flonase (fluticasone) one spray per nostril on Mon/Wed/Fri, and INCREASE Astelin (azelastine) 2 sprays per nostril 1-2 times daily as needed - You can use an extra dose of the antihistamine, if needed, for breakthrough symptoms.  - Consider nasal saline rinses 1-2 times daily to remove allergens from the nasal cavities as well as help with mucous clearance (this is especially helpful to do before the nasal sprays are given) -Continue allergy injections per protocol and have access to epinephrine autoinjector device.  2. Allergic contact dermatitis - We could consider patch testing in the  future. - We can even do testing to various cosmetics that you bring in.  - Consider that for the future.  3. Mild intermittent asthma May use albuterol 2 puffs every 4-6 hours as needed for cough, wheeze, tightness in chest, or shortness of breath. We will refill this for you.  Please let us know if this treatment plan is not working well for you Schedule a follow-up appointment in 3 months or sooner if needed    Return in about 3 months (around 07/04/2021), or if symptoms worsen or fail to improve.    Thank you for the opportunity to care for this patient.  Please do  not hesitate to contact me with questions.  Nehemiah Settle, FNP [facility]

## 2021-04-04 ENCOUNTER — Telehealth: Payer: Self-pay | Admitting: Family Medicine

## 2021-04-04 DIAGNOSIS — Z114 Encounter for screening for human immunodeficiency virus [HIV]: Secondary | ICD-10-CM

## 2021-04-04 DIAGNOSIS — Z79899 Other long term (current) drug therapy: Secondary | ICD-10-CM

## 2021-04-04 DIAGNOSIS — Z1159 Encounter for screening for other viral diseases: Secondary | ICD-10-CM

## 2021-04-04 DIAGNOSIS — E785 Hyperlipidemia, unspecified: Secondary | ICD-10-CM

## 2021-04-04 NOTE — Telephone Encounter (Signed)
Patient is needing to get labs done, she has orders in system  from 11/09/19 she states forgot to get done. Please advise

## 2021-04-04 NOTE — Telephone Encounter (Signed)
Blood work ordered in Epic. Patient notified. 

## 2021-04-04 NOTE — Telephone Encounter (Signed)
Lipid, liver, metabolic 7  Also inform patient it is recommended for all adults to do one-time HIV antibody, hepatitis C antibody

## 2021-04-09 LAB — HEPATIC FUNCTION PANEL
ALT: 10 IU/L (ref 0–32)
AST: 14 IU/L (ref 0–40)
Albumin: 4.4 g/dL (ref 3.8–4.8)
Alkaline Phosphatase: 86 IU/L (ref 44–121)
Bilirubin Total: 0.3 mg/dL (ref 0.0–1.2)
Bilirubin, Direct: <0.1 mg/dL (ref 0.00–0.40)
Total Protein: 6.5 g/dL (ref 6.0–8.5)

## 2021-04-09 LAB — LIPID PANEL
Chol/HDL Ratio: 6.4 ratio — ABNORMAL HIGH (ref 0.0–4.4)
Cholesterol, Total: 366 mg/dL — ABNORMAL HIGH (ref 100–199)
HDL: 57 mg/dL (ref >39)
LDL Chol Calc (NIH): 279 mg/dL — ABNORMAL HIGH (ref 0–99)
Triglycerides: 154 mg/dL — ABNORMAL HIGH (ref 0–149)
VLDL Cholesterol Cal: 30 mg/dL (ref 5–40)

## 2021-04-09 LAB — BASIC METABOLIC PANEL
BUN/Creatinine Ratio: 21 (ref 12–28)
BUN: 14 mg/dL (ref 8–27)
CO2: 22 mmol/L (ref 20–29)
Calcium: 9.3 mg/dL (ref 8.7–10.3)
Chloride: 105 mmol/L (ref 96–106)
Creatinine, Ser: 0.67 mg/dL (ref 0.57–1.00)
Glucose: 98 mg/dL (ref 65–99)
Potassium: 4.6 mmol/L (ref 3.5–5.2)
Sodium: 141 mmol/L (ref 134–144)
eGFR: 99 mL/min/{1.73_m2} (ref >59)

## 2021-04-09 LAB — HEPATITIS C ANTIBODY: Hep C Virus Ab: <0.1 s/co ratio (ref 0.0–0.9)

## 2021-04-09 LAB — HIV ANTIBODY (ROUTINE TESTING W REFLEX): HIV Screen 4th Generation wRfx: NONREACTIVE

## 2021-04-12 ENCOUNTER — Ambulatory Visit (INDEPENDENT_AMBULATORY_CARE_PROVIDER_SITE_OTHER): Payer: BC Managed Care – PPO

## 2021-04-12 DIAGNOSIS — J309 Allergic rhinitis, unspecified: Secondary | ICD-10-CM

## 2021-04-12 NOTE — Progress Notes (Signed)
Patient request for her vials be transferred to the [facility] office due to her not being able to make to the [place] office during clinic hours. Vials have been packed up and will be sent to the  office for next week. Patient is aware.

## 2021-04-15 ENCOUNTER — Telehealth: Payer: Self-pay | Admitting: *Deleted

## 2021-04-15 NOTE — Telephone Encounter (Signed)
Vials have been transferred to [facility] per patient's request so that she can continue her injections. Called patient and advised that her vials were here in [place]. Patient verbalized understanding.

## 2021-04-16 ENCOUNTER — Telehealth: Payer: Self-pay | Admitting: Family Medicine

## 2021-04-16 ENCOUNTER — Encounter: Payer: BC Managed Care – PPO | Admitting: Family Medicine

## 2021-04-16 ENCOUNTER — Other Ambulatory Visit: Payer: Self-pay

## 2021-04-16 NOTE — Progress Notes (Signed)
Subjective:    Patient ID: Joyce Jacobs, female    DOB: September 02, 1960, 60 y.o.   MRN: [id number]  Hyperlipidemia This is a chronic problem. The current episode started more than 1 year ago. Current antihyperlipidemic treatment includes diet change. There are no compliance problems.  Risk factors for coronary artery disease include dyslipidemia and post-menopausal.    Virtual Visit via Telephone Note  I connected with Joyce Jacobs on 04/16/21 at  2:20 PM EDT by telephone and verified that I am speaking with the correct person using two identifiers.  Location: Patient: home Provider: office   I discussed the limitations, risks, security and privacy concerns of performing an evaluation and management service by telephone and the availability of in person appointments. I also discussed with the patient that there may be a patient responsible charge related to this service. The patient expressed understanding and agreed to proceed.   History of Present Illness:    Observations/Objective:   Assessment and Plan:   Follow Up Instructions:    I discussed the assessment and treatment plan with the patient. The patient was provided an opportunity to ask questions and all were answered. The patient agreed with the plan and demonstrated an understanding of the instructions.   The patient was advised to call back or seek an in-person evaluation if the symptoms worsen or if the condition fails to improve as anticipated.  I provided 0 minutes of non-face-to-face time during this encounter.     Review of Systems     Objective:   Physical Exam        Assessment & Plan:  Was unable to connect due to phone issues with the patient

## 2021-04-16 NOTE — Telephone Encounter (Signed)
Sent my chart message to patient. Joyce Jacobs ahead and placed pt on schedule)

## 2021-04-16 NOTE — Telephone Encounter (Signed)
Friday morning 11:20 AM

## 2021-04-16 NOTE — Telephone Encounter (Signed)
Pt called back for her telephone visit. Number on appt was wrong. Pt states she needs to reschedule due to having open house until 8 pm tonight. Pt states she is available Wed,Thurs,or Friday this week in the morning. Please advise. Thank you

## 2021-04-17 ENCOUNTER — Ambulatory Visit (INDEPENDENT_AMBULATORY_CARE_PROVIDER_SITE_OTHER): Payer: BC Managed Care – PPO

## 2021-04-17 DIAGNOSIS — J309 Allergic rhinitis, unspecified: Secondary | ICD-10-CM | POA: Diagnosis not present

## 2021-04-17 NOTE — Telephone Encounter (Signed)
Pt sent my chart message: Yes, I can do a visit on Friday. I am pretty sure I can do an office visit. I will let you know tomorrow. [place]

## 2021-04-19 ENCOUNTER — Ambulatory Visit: Payer: BC Managed Care – PPO | Admitting: Family Medicine

## 2021-04-19 ENCOUNTER — Encounter: Payer: Self-pay | Admitting: Family Medicine

## 2021-04-19 ENCOUNTER — Other Ambulatory Visit: Payer: Self-pay

## 2021-04-19 VITALS — BP 118/76 | HR 62 | Temp 98.1°F | Ht 63.75 in | Wt 160.0 lb

## 2021-04-19 DIAGNOSIS — E785 Hyperlipidemia, unspecified: Secondary | ICD-10-CM | POA: Diagnosis not present

## 2021-04-19 DIAGNOSIS — Z79899 Other long term (current) drug therapy: Secondary | ICD-10-CM

## 2021-04-19 MED ORDER — PRALUENT 75 MG/ML ~~LOC~~ SOAJ: SUBCUTANEOUS | 5 refills | Status: DC

## 2021-04-19 MED ORDER — SUMATRIPTAN SUCCINATE 100 MG PO TABS: ORAL_TABLET | ORAL | 3 refills | Status: DC

## 2021-04-19 NOTE — Progress Notes (Signed)
Subjective:    Patient ID: Joyce Jacobs, female    DOB: Apr 07, 1961, 60 y.o.   MRN: [id number]  HPI  Patient is here today to go over lab results  LDL significantly elevated HDL fairly good Triglycerides slightly elevated  Requesting all medication refills   Would also like to discuss recent allergist visit appt Did have allergy testing which showed multiple allergies now on allergy immunology shots which is helped Review of Systems     Objective:   Physical Exam  Today's visit was all discussion      Assessment & Plan:   Hyperlipidemia Could well be familial hyperlipidemia given her numbers Will look into whether or not genetic testing would be advisable Patient did not tolerate statins Recommend Praluent start off 75 mg subcutaneous every 2 weeks Check lipid liver profile in approximately 5 to 6 weeks Based upon these results may need to go up on the dosing Goal is to get at least 50% reduction in possible of LDL

## 2021-04-23 ENCOUNTER — Telehealth: Payer: Self-pay | Admitting: Family Medicine

## 2021-04-23 NOTE — Telephone Encounter (Signed)
Praluent '75mg'$ /ml approved from 04/19/21-04/19/22. My chart message sent to patient.

## 2021-04-24 ENCOUNTER — Ambulatory Visit (INDEPENDENT_AMBULATORY_CARE_PROVIDER_SITE_OTHER): Payer: BC Managed Care – PPO

## 2021-04-24 DIAGNOSIS — J309 Allergic rhinitis, unspecified: Secondary | ICD-10-CM | POA: Diagnosis not present

## 2021-04-30 ENCOUNTER — Ambulatory Visit (INDEPENDENT_AMBULATORY_CARE_PROVIDER_SITE_OTHER): Payer: BC Managed Care – PPO

## 2021-04-30 ENCOUNTER — Telehealth: Payer: Self-pay | Admitting: Family Medicine

## 2021-04-30 DIAGNOSIS — J309 Allergic rhinitis, unspecified: Secondary | ICD-10-CM

## 2021-04-30 NOTE — Telephone Encounter (Signed)
Joyce Drown, MD  Joyce Males, LPN Joyce Jacobs  Please connect with cardiology  What I am interested in is which doctor or nurse practitioner heads up the lipid clinic that cardiology has regarding patients with severely elevated lipids  If I could have a name and also a clinic number that would be great  Thanks-Dr. Nicki Reaper   Contacted [facility] at [phone]. Representative I spoke with states that they would need a referral before they can go any further. Informed representative the provider would like name of doctor or NP that heads up the clinic, rep states they need a referral before moving forward. Please advise. Thank you

## 2021-05-09 ENCOUNTER — Encounter: Payer: Self-pay | Admitting: Family Medicine

## 2021-05-09 ENCOUNTER — Ambulatory Visit (INDEPENDENT_AMBULATORY_CARE_PROVIDER_SITE_OTHER): Payer: BC Managed Care – PPO | Admitting: Family Medicine

## 2021-05-09 ENCOUNTER — Other Ambulatory Visit: Payer: Self-pay

## 2021-05-09 ENCOUNTER — Telehealth: Payer: Self-pay | Admitting: Family Medicine

## 2021-05-09 DIAGNOSIS — U071 COVID-19: Secondary | ICD-10-CM | POA: Diagnosis not present

## 2021-05-09 MED ORDER — ONDANSETRON HCL 8 MG PO TABS: 8.0000 mg | ORAL_TABLET | Freq: Three times a day (TID) | ORAL | 1 refills | Status: DC | PRN

## 2021-05-09 MED ORDER — NIRMATRELVIR/RITONAVIR (PAXLOVID)TABLET: 3.0000 | ORAL_TABLET | Freq: Two times a day (BID) | ORAL | 0 refills | Status: AC

## 2021-05-09 NOTE — Progress Notes (Addendum)
Subjective:    Patient ID: Joyce Jacobs, female    DOB: July 17, 1961, 60 y.o.   MRN: [id number] Should be noted that 15 minutes was spent with the patient then additional time documenting thank you-Dr. Lilyan Punt HPI Pt woke up at midnight with scratchy throat, aches, feverish, cough, losing voice. Husband is positive. Pt has not taken COVID test at this time but is going to take one.    Patient presents today with respiratory illness Number of days present-1  Symptoms include- nausea chills body aches no doe no sob  Presence of worrisome signs (severe shortness of breath, lethargy, etc.) - none  Recent/current visit to urgent care or ER- none  Recent direct exposure to Covid- none  Any current Covid testing- positive  Virtual Visit via Telephone Note  I connected with Joyce Jacobs on 05/09/21 at 11:40 AM EDT by telephone and verified that I am speaking with the correct person using two identifiers.  Location: Patient: home Provider: office   I discussed the limitations, risks, security and privacy concerns of performing an evaluation and management service by telephone and the availability of in person appointments. I also discussed with the patient that there may be a patient responsible charge related to this service. The patient expressed understanding and agreed to proceed.   History of Present Illness:    Observations/Objective:   Assessment and Plan:   Follow Up Instructions:    I discussed the assessment and treatment plan with the patient. The patient was provided an opportunity to ask questions and all were answered. The patient agreed with the plan and demonstrated an understanding of the instructions.   The patient was advised to call back or seek an in-person evaluation if the symptoms worsen or if the condition fails to improve as anticipated.  I provided  minutes of non-face-to-face time during this encounter.      Review of Systems      Objective:   Physical Exam  Today's visit was via telephone Physical exam was not possible for this visit       Assessment & Plan:   Covid infection This is a viral process.  Mild cases are treated with supportive measures at home such as Tylenol rest fluids.  In some situations monoclonal antibodies may be appropriate depending on the patient's risk criteria.  The patient was educated regarding progressive illness including respiratory, persistent vomiting, change in mental status.  If any of these occur ER evaluation is recommended. Patient was educated about the following as well Covid-19 respiratory warning: Covid-19 is a virus that causes hypoxia (low oxygen level in blood) in some people. If you develop any changes in your usual breathing pattern: difficulty catching your breath, more short winded with activity or with resting, or anything that concerns you about your breathing, do not hesitate to go to the emergency department immediately for evaluation. Please do not delay to get treatment.   Agrees with plan of care discussed today. Understands warning signs to seek further care: Chest pain, shortness of breath, mental confusion, profuse vomiting, any significant change in health. Understands to follow-up if symptoms do not improve, or worsen.    Paxlovid discussed in detail.  Normal GFR Medication adjustments discussed.  Side effects discussed.  Follow-up if problems

## 2021-05-09 NOTE — Telephone Encounter (Signed)
Ms. eria, kalkwarf are scheduled for a virtual visit with your provider today.    Just as we do with appointments in the office, we must obtain your consent to participate.  Your consent will be active for this visit and any virtual visit you may have with one of our providers in the next 365 days.    If you have a MyChart account, I can also send a copy of this consent to you electronically.  All virtual visits are billed to your insurance company just like a traditional visit in the office.  As this is a virtual visit, video technology does not allow for your provider to perform a traditional examination.  This may limit your provider's ability to fully assess your condition.  If your provider identifies any concerns that need to be evaluated in person or the need to arrange testing such as labs, EKG, etc, we will make arrangements to do so.    Although advances in technology are sophisticated, we cannot ensure that it will always work on either your end or our end.  If the connection with a video visit is poor, we may have to switch to a telephone visit.  With either a video or telephone visit, we are not always able to ensure that we have a secure connection.   I need to obtain your verbal consent now.   Are you willing to proceed with your visit today?   Jaspreet Soley has provided verbal consent on 05/09/2021 for a virtual visit (video or telephone).   Vicente Males, LPN [id number]  [id number] AM

## 2021-05-09 NOTE — Telephone Encounter (Signed)
Pt contacted and set up for phone visit this morning. Pt verbalized understanding.

## 2021-05-09 NOTE — Telephone Encounter (Signed)
Nurses  Please connect with Barbera Setters I would recommend for her to take the COVID test We can do a phone visit with her More than likely I can fit this in close to the noon hour (If she can be flexible with the time of my call we can certainly help her) Also currently she should stop her cholesterol medicine we will resume it after she finishes COVID treatment Thanks-Dr. Nicki Reaper

## 2021-05-16 ENCOUNTER — Ambulatory Visit (INDEPENDENT_AMBULATORY_CARE_PROVIDER_SITE_OTHER): Payer: BC Managed Care – PPO | Admitting: *Deleted

## 2021-05-16 DIAGNOSIS — J309 Allergic rhinitis, unspecified: Secondary | ICD-10-CM | POA: Diagnosis not present

## 2021-05-22 ENCOUNTER — Ambulatory Visit (INDEPENDENT_AMBULATORY_CARE_PROVIDER_SITE_OTHER): Payer: BC Managed Care – PPO | Admitting: *Deleted

## 2021-05-22 DIAGNOSIS — J309 Allergic rhinitis, unspecified: Secondary | ICD-10-CM | POA: Diagnosis not present

## 2021-05-30 ENCOUNTER — Ambulatory Visit (INDEPENDENT_AMBULATORY_CARE_PROVIDER_SITE_OTHER): Payer: BC Managed Care – PPO | Admitting: *Deleted

## 2021-05-30 DIAGNOSIS — J309 Allergic rhinitis, unspecified: Secondary | ICD-10-CM

## 2021-06-05 ENCOUNTER — Ambulatory Visit (INDEPENDENT_AMBULATORY_CARE_PROVIDER_SITE_OTHER): Payer: BC Managed Care – PPO

## 2021-06-05 DIAGNOSIS — J309 Allergic rhinitis, unspecified: Secondary | ICD-10-CM | POA: Diagnosis not present

## 2021-06-09 NOTE — Progress Notes (Signed)
This encounter was created in error - please disregard.

## 2021-06-12 ENCOUNTER — Ambulatory Visit (INDEPENDENT_AMBULATORY_CARE_PROVIDER_SITE_OTHER): Payer: BC Managed Care – PPO | Admitting: *Deleted

## 2021-06-12 DIAGNOSIS — J309 Allergic rhinitis, unspecified: Secondary | ICD-10-CM | POA: Diagnosis not present

## 2021-06-19 ENCOUNTER — Ambulatory Visit (INDEPENDENT_AMBULATORY_CARE_PROVIDER_SITE_OTHER): Payer: BC Managed Care – PPO

## 2021-06-19 DIAGNOSIS — J309 Allergic rhinitis, unspecified: Secondary | ICD-10-CM | POA: Diagnosis not present

## 2021-06-26 ENCOUNTER — Ambulatory Visit (INDEPENDENT_AMBULATORY_CARE_PROVIDER_SITE_OTHER): Payer: BC Managed Care – PPO

## 2021-06-26 DIAGNOSIS — J309 Allergic rhinitis, unspecified: Secondary | ICD-10-CM | POA: Diagnosis not present

## 2021-07-02 ENCOUNTER — Ambulatory Visit (INDEPENDENT_AMBULATORY_CARE_PROVIDER_SITE_OTHER): Payer: BC Managed Care – PPO | Admitting: *Deleted

## 2021-07-02 DIAGNOSIS — J309 Allergic rhinitis, unspecified: Secondary | ICD-10-CM | POA: Diagnosis not present

## 2021-07-08 ENCOUNTER — Other Ambulatory Visit: Payer: Self-pay

## 2021-07-08 ENCOUNTER — Encounter: Payer: Self-pay | Admitting: Family Medicine

## 2021-07-08 ENCOUNTER — Ambulatory Visit (INDEPENDENT_AMBULATORY_CARE_PROVIDER_SITE_OTHER): Payer: BC Managed Care – PPO | Admitting: Family Medicine

## 2021-07-08 VITALS — HR 80 | Temp 98.1°F | Ht 63.75 in | Wt 160.0 lb

## 2021-07-08 DIAGNOSIS — J019 Acute sinusitis, unspecified: Secondary | ICD-10-CM

## 2021-07-08 DIAGNOSIS — R051 Acute cough: Secondary | ICD-10-CM

## 2021-07-08 DIAGNOSIS — R6889 Other general symptoms and signs: Secondary | ICD-10-CM | POA: Diagnosis not present

## 2021-07-08 MED ORDER — AMOXICILLIN-POT CLAVULANATE 875-125 MG PO TABS
1.0000 | ORAL_TABLET | Freq: Two times a day (BID) | ORAL | 0 refills | Status: DC
Start: 2021-07-08 — End: 2021-11-22

## 2021-07-08 NOTE — Progress Notes (Signed)
Subjective:    Patient ID: Joyce Jacobs, female    DOB: 1961-04-15, 60 y.o.   MRN: [id number]  HPI  Patient states has been coughing since Friday started w/ scratchy throat  Diarrhea, wheezing, muscle aches, ear pain , sinus congestion Using robitussin, aleve, inhaler Patient relates some body aches relates some fatigue tiredness bilateral ear pain no difficulty breathing.  No vomiting but has had some loose stools Review of Systems     Objective:   Physical Exam Bronchial cough noted.  No crackles no respiratory distress  Eardrums are normal She is concerned that this could turn into a more serious bacterial illness    she is stayed home from school the past few days Assessment & Plan:   Probable influenza Possible secondary rhinosinusitis as a result A prescription for antibiotics was given if worsening illness over the course of the next few days may fill the medicine but if turning the corner gradually getting better no need to fill the medicine Rationale discussed

## 2021-07-09 LAB — COVID-19, FLU A+B AND RSV
Influenza A, NAA: DETECTED — AB
Influenza B, NAA: NOT DETECTED
RSV, NAA: NOT DETECTED
SARS-CoV-2, NAA: NOT DETECTED

## 2021-07-09 LAB — SPECIMEN STATUS REPORT

## 2021-07-17 ENCOUNTER — Telehealth: Payer: Self-pay | Admitting: Family Medicine

## 2021-07-17 NOTE — Telephone Encounter (Signed)
Patient was seen 11/14 with congestion and cough ,she states still has cough,congestion, ache and fatigue. Please advise [facility]

## 2021-07-17 NOTE — Telephone Encounter (Signed)
Spoke with patient she states is stable , her main concern is some fatigue and some nasal congestion , but able to blow her nose. She took the off today and will not go back until next week. Hopes to get rest over the holidays to help w/ fatigue. She is on day 5 of first round of abx treatment and will continue to monitor and call with concerns.

## 2021-07-17 NOTE — Telephone Encounter (Signed)
This patient had the flu Then had secondary sinus Was put on Augmentin back on 1114 Please find out from her-if she feels this is mainly chest congestion and feeling short of breath we will need to work her a later today to make sure we are not hearing pneumonia. We are seeing many cases of the flu because a lingering effect If she feels it is primarily more in her head with congestion then we could send in a second round of antibiotics If she does not improve with the second round of antibiotics she would need to be seen Please talk with patient then we can go from there

## 2021-07-24 ENCOUNTER — Ambulatory Visit (INDEPENDENT_AMBULATORY_CARE_PROVIDER_SITE_OTHER): Payer: BC Managed Care – PPO

## 2021-07-24 DIAGNOSIS — J309 Allergic rhinitis, unspecified: Secondary | ICD-10-CM

## 2021-07-31 ENCOUNTER — Ambulatory Visit (INDEPENDENT_AMBULATORY_CARE_PROVIDER_SITE_OTHER): Payer: BC Managed Care – PPO

## 2021-07-31 ENCOUNTER — Ambulatory Visit: Payer: Self-pay

## 2021-07-31 DIAGNOSIS — J309 Allergic rhinitis, unspecified: Secondary | ICD-10-CM

## 2021-08-07 ENCOUNTER — Ambulatory Visit (INDEPENDENT_AMBULATORY_CARE_PROVIDER_SITE_OTHER): Payer: BC Managed Care – PPO

## 2021-08-07 DIAGNOSIS — J309 Allergic rhinitis, unspecified: Secondary | ICD-10-CM

## 2021-08-14 ENCOUNTER — Ambulatory Visit (INDEPENDENT_AMBULATORY_CARE_PROVIDER_SITE_OTHER): Payer: BC Managed Care – PPO

## 2021-08-14 DIAGNOSIS — J309 Allergic rhinitis, unspecified: Secondary | ICD-10-CM

## 2021-08-21 ENCOUNTER — Ambulatory Visit (INDEPENDENT_AMBULATORY_CARE_PROVIDER_SITE_OTHER): Payer: BC Managed Care – PPO

## 2021-08-21 DIAGNOSIS — J309 Allergic rhinitis, unspecified: Secondary | ICD-10-CM | POA: Diagnosis not present

## 2021-08-29 ENCOUNTER — Ambulatory Visit (INDEPENDENT_AMBULATORY_CARE_PROVIDER_SITE_OTHER): Payer: BC Managed Care – PPO

## 2021-08-29 DIAGNOSIS — J309 Allergic rhinitis, unspecified: Secondary | ICD-10-CM

## 2021-09-04 ENCOUNTER — Ambulatory Visit (INDEPENDENT_AMBULATORY_CARE_PROVIDER_SITE_OTHER): Payer: BC Managed Care – PPO

## 2021-09-04 DIAGNOSIS — J309 Allergic rhinitis, unspecified: Secondary | ICD-10-CM | POA: Diagnosis not present

## 2021-09-11 ENCOUNTER — Ambulatory Visit (INDEPENDENT_AMBULATORY_CARE_PROVIDER_SITE_OTHER): Payer: BC Managed Care – PPO

## 2021-09-11 DIAGNOSIS — J309 Allergic rhinitis, unspecified: Secondary | ICD-10-CM | POA: Diagnosis not present

## 2021-09-18 ENCOUNTER — Ambulatory Visit (INDEPENDENT_AMBULATORY_CARE_PROVIDER_SITE_OTHER): Payer: BC Managed Care – PPO

## 2021-09-18 DIAGNOSIS — J309 Allergic rhinitis, unspecified: Secondary | ICD-10-CM

## 2021-09-25 ENCOUNTER — Ambulatory Visit (INDEPENDENT_AMBULATORY_CARE_PROVIDER_SITE_OTHER): Payer: BC Managed Care – PPO | Admitting: *Deleted

## 2021-09-25 DIAGNOSIS — J309 Allergic rhinitis, unspecified: Secondary | ICD-10-CM | POA: Diagnosis not present

## 2021-10-02 ENCOUNTER — Ambulatory Visit (INDEPENDENT_AMBULATORY_CARE_PROVIDER_SITE_OTHER): Payer: BC Managed Care – PPO | Admitting: *Deleted

## 2021-10-02 DIAGNOSIS — J309 Allergic rhinitis, unspecified: Secondary | ICD-10-CM | POA: Diagnosis not present

## 2021-10-16 ENCOUNTER — Ambulatory Visit (INDEPENDENT_AMBULATORY_CARE_PROVIDER_SITE_OTHER): Payer: BC Managed Care – PPO

## 2021-10-16 DIAGNOSIS — J309 Allergic rhinitis, unspecified: Secondary | ICD-10-CM | POA: Diagnosis not present

## 2021-10-18 ENCOUNTER — Encounter: Payer: Self-pay | Admitting: Family Medicine

## 2021-10-23 ENCOUNTER — Encounter: Payer: Self-pay | Admitting: Family Medicine

## 2021-10-23 ENCOUNTER — Ambulatory Visit (INDEPENDENT_AMBULATORY_CARE_PROVIDER_SITE_OTHER): Payer: BC Managed Care – PPO

## 2021-10-23 DIAGNOSIS — J309 Allergic rhinitis, unspecified: Secondary | ICD-10-CM

## 2021-10-23 DIAGNOSIS — E785 Hyperlipidemia, unspecified: Secondary | ICD-10-CM

## 2021-10-23 DIAGNOSIS — Z79899 Other long term (current) drug therapy: Secondary | ICD-10-CM

## 2021-10-23 NOTE — Telephone Encounter (Signed)
Nurses-please order lipid and liver profile thank you ?Also send [facility] notification that this has been ordered ?

## 2021-10-25 LAB — HEPATIC FUNCTION PANEL
ALT: 14 IU/L (ref 0–32)
AST: 17 IU/L (ref 0–40)
Albumin: 4.3 g/dL (ref 3.8–4.8)
Alkaline Phosphatase: 72 IU/L (ref 44–121)
Bilirubin Total: 0.3 mg/dL (ref 0.0–1.2)
Bilirubin, Direct: <0.1 mg/dL (ref 0.00–0.40)
Total Protein: 6.1 g/dL (ref 6.0–8.5)

## 2021-10-25 LAB — LIPID PANEL
Chol/HDL Ratio: 3.4 ratio (ref 0.0–4.4)
Cholesterol, Total: 202 mg/dL — ABNORMAL HIGH (ref 100–199)
HDL: 60 mg/dL (ref >39)
LDL Chol Calc (NIH): 119 mg/dL — ABNORMAL HIGH (ref 0–99)
Triglycerides: 128 mg/dL (ref 0–149)
VLDL Cholesterol Cal: 23 mg/dL (ref 5–40)

## 2021-10-30 ENCOUNTER — Ambulatory Visit (INDEPENDENT_AMBULATORY_CARE_PROVIDER_SITE_OTHER): Payer: BC Managed Care – PPO

## 2021-10-30 DIAGNOSIS — J309 Allergic rhinitis, unspecified: Secondary | ICD-10-CM

## 2021-11-06 ENCOUNTER — Ambulatory Visit (INDEPENDENT_AMBULATORY_CARE_PROVIDER_SITE_OTHER): Payer: BC Managed Care – PPO

## 2021-11-06 DIAGNOSIS — J309 Allergic rhinitis, unspecified: Secondary | ICD-10-CM

## 2021-11-12 ENCOUNTER — Ambulatory Visit: Payer: BC Managed Care – PPO | Admitting: Family Medicine

## 2021-11-14 ENCOUNTER — Ambulatory Visit (INDEPENDENT_AMBULATORY_CARE_PROVIDER_SITE_OTHER): Payer: BC Managed Care – PPO

## 2021-11-14 DIAGNOSIS — J309 Allergic rhinitis, unspecified: Secondary | ICD-10-CM

## 2021-11-22 ENCOUNTER — Encounter: Payer: Self-pay | Admitting: Nurse Practitioner

## 2021-11-22 ENCOUNTER — Ambulatory Visit: Payer: BC Managed Care – PPO | Admitting: Nurse Practitioner

## 2021-11-22 VITALS — BP 131/79 | HR 74 | Temp 98.2°F | Ht 63.75 in | Wt 161.0 lb

## 2021-11-22 DIAGNOSIS — B9689 Other specified bacterial agents as the cause of diseases classified elsewhere: Secondary | ICD-10-CM

## 2021-11-22 DIAGNOSIS — J069 Acute upper respiratory infection, unspecified: Secondary | ICD-10-CM | POA: Diagnosis not present

## 2021-11-22 MED ORDER — AMOXICILLIN-POT CLAVULANATE 875-125 MG PO TABS: 1.0000 | ORAL_TABLET | Freq: Two times a day (BID) | ORAL | 0 refills | Status: DC

## 2021-11-22 NOTE — Progress Notes (Signed)
? ?  Subjective:  ? ? Patient ID: Joshlynn Moos, female    DOB: 1961/08/25, 61 y.o.   MRN: [id number] ? ?HPI ?Presents complaints of sinus symptoms over the past 2 days.  Generalized sinus pressure.  Sore throat.  Bilateral ear pain.  Left-sided throat pain which she thinks may be due to postnasal drainage.  Occasional cough more at night.  No wheezing shortness of breath or chest pain.  Has had achiness and chills at times, no documented fever.  Taking fluids well.  Voiding normal limit.  Being followed by the allergy specialist and is currently taking allergy shots but she had to cancel for this week due to illness. ?Adherent to her medication regimen. ? ? ? ? ?   ?Objective:  ? Physical Exam ?NAD.  Alert, oriented.  TMs retracted bilaterally, more on the left side, color normal limit.  Pharynx erythematous with slightly green PND noted.  Neck supple with mild soft anterior adenopathy.  Lungs clear.  Heart regular rate rhythm. ? ?Today's Vitals  ? 11/22/21 1520  ?BP: 131/79  ?Pulse: 74  ?Temp: 98.2 ?F (36.8 ?C)  ?TempSrc: Oral  ?SpO2: 96%  ?Weight: 161 lb (73 kg)  ?Height: 5' 3.75" (1.619 m)  ? ?Body mass index is 27.85 kg/m?. ? ? ? ? ?   ?Assessment & Plan:  ?Bacterial URI ?Meds ordered this encounter  ?Medications  ? amoxicillin-clavulanate (AUGMENTIN) 875-125 MG tablet  ?  Sig: Take 1 tablet by mouth 2 (two) times daily.  ?  Dispense:  20 tablet  ?  Refill:  0  ?  Order Specific Question:   Supervising Provider  ?  Answer:   Lilyan Punt A [[id number]]  ? ?Continue current medications for symptomatic control. ?Warning signs reviewed.  Call back next week if no improvement, sooner if worse. ? ?

## 2021-11-27 ENCOUNTER — Ambulatory Visit (INDEPENDENT_AMBULATORY_CARE_PROVIDER_SITE_OTHER): Payer: BC Managed Care – PPO

## 2021-11-27 DIAGNOSIS — J309 Allergic rhinitis, unspecified: Secondary | ICD-10-CM

## 2021-12-03 ENCOUNTER — Ambulatory Visit (INDEPENDENT_AMBULATORY_CARE_PROVIDER_SITE_OTHER): Payer: BC Managed Care – PPO | Admitting: Family Medicine

## 2021-12-03 VITALS — BP 120/76 | HR 72 | Temp 98.1°F | Ht 63.75 in | Wt 161.0 lb

## 2021-12-03 DIAGNOSIS — H6591 Unspecified nonsuppurative otitis media, right ear: Secondary | ICD-10-CM | POA: Diagnosis not present

## 2021-12-03 MED ORDER — CEFDINIR 300 MG PO CAPS: 300.0000 mg | ORAL_CAPSULE | Freq: Two times a day (BID) | ORAL | 0 refills | Status: DC

## 2021-12-03 NOTE — Patient Instructions (Addendum)
Antibiotic twice a day. ? ?Have Dr. Wolfgang Phoenix recheck next week. ? ?I have placed a referral to ENT if this is needed. ? ?Take care ? ?Dr. Lacinda Axon  ? ? ?

## 2021-12-03 NOTE — Progress Notes (Signed)
? ?Subjective:  ?Patient ID: Joyce Jacobs, female    DOB: [birth year]  Age: 61 y.o. MRN: [id number] ? ?CC: ?Chief Complaint  ?Patient presents with  ? right ear pain  ?  Started on Sunday. Sporadic, sharp pain with achy feeling. ?   ? ? ?HPI: ? ?61 year old female presents for evaluation of the above. ? ?Patient recently treated for bacterial respiratory infection.  Was placed on Augmentin.  She has completed the antibiotic course.  She states that she improved but then worsened again.  She is now experiencing recurrent sinus pressure and congestion.  She is also experiencing right ear pain.  She has a history of prior ear infections that resulted in perforation.  She has had tympanostomy tubes as well as surgery to repair a prior perforation.  She is quite concerned about her ear pain.  No fever.  No other associated symptoms. ? ?Patient Active Problem List  ? Diagnosis Date Noted  ? Mucoid otitis media of right ear with effusion 12/03/2021  ? Seasonal and perennial allergic rhinitis 02/07/2021  ? Allergic contact dermatitis 02/07/2021  ? Mild intermittent asthma, uncomplicated 02/07/2021  ? Food intolerance 02/07/2021  ? Fibroids 03/05/2015  ? Familial hyperlipidemia 07/06/2014  ? 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 Migraine headache 04/06/2013  ? ? ?Social Hx   ?Social History  ? ?Socioeconomic History  ? Marital status: Married  ?  Spouse name: Not on file  ? Number of children: Not on file  ? Years of education: Not on file  ? Highest education level: Not on file  ?Occupational History  ? Not on file  ?Tobacco Use  ? Smoking status: Never  ? Smokeless tobacco: Never  ?Vaping Use  ? Vaping Use: Never used  ?Substance and Sexual Activity  ? Alcohol use: Yes  ?  Comment: social  ? Drug use: No  ? Sexual activity: Yes  ?  Birth control/protection: None  ?Other Topics Concern  ? Not on file  ?Social History Narrative  ? Not on file  ? ?Social Determinants of Health  ? ?Financial Resource Strain: Not on file   ?Food Insecurity: Not on file  ?Transportation Needs: Not on file  ?Physical Activity: Not on file  ?Stress: Not on file  ?Social Connections: Not on file  ? ? ?Review of Systems ?Per HPI ? ?Objective:  ?BP 120/76   Pulse 72   Temp 98.1 ?F (36.7 ?C) (Oral)   Ht 5' 3.75" (1.619 m)   Wt 161 lb (73 kg)   LMP 03/05/2015   SpO2 98%   BMI 27.85 kg/m?  ? ? ?  12/03/2021  ?  2:02 PM 11/22/2021  ?  3:20 PM 07/08/2021  ?  4:11 PM  ?BP/Weight  ?Systolic BP 120 131   ?Diastolic BP 76 79   ?Wt. (Lbs) 161 161 160  ?BMI 27.85 kg/m2 27.85 kg/m2 27.68 kg/m2  ? ? ?Physical Exam ?Vitals and nursing note reviewed.  ?Constitutional:   ?   General: She is not in acute distress. ?   Appearance: Normal appearance. She is not ill-appearing.  ?HENT:  ?   Head: Normocephalic and atraumatic.  ?   Left Ear: Tympanic membrane normal.  ?   Ears:  ?   Comments: Right TM with mucoid effusion ?Eyes:  ?   General:     ?   Right eye: No discharge.     ?   Left eye: No discharge.  ?   Conjunctiva/sclera: Conjunctivae normal.  ?Cardiovascular:  ?  Rate and Rhythm: Normal rate and regular rhythm.  ?   Heart sounds: No murmur heard. ?Pulmonary:  ?   Effort: Pulmonary effort is normal.  ?   Breath sounds: Normal breath sounds.  ?Neurological:  ?   Mental Status: She is alert.  ? ? ?Lab Results  ?Component Value Date  ? WBC 5.0 02/23/2018  ? HGB 13.8 02/23/2018  ? HCT 41.5 02/23/2018  ? PLT 217 02/23/2018  ? GLUCOSE 98 04/08/2021  ? CHOL 202 (H) 10/24/2021  ? TRIG 128 10/24/2021  ? HDL 60 10/24/2021  ? LDLCALC 119 (H) 10/24/2021  ? ALT 14 10/24/2021  ? AST 17 10/24/2021  ? NA 141 04/08/2021  ? K 4.6 04/08/2021  ? CL 105 04/08/2021  ? CREATININE 0.67 04/08/2021  ? BUN 14 04/08/2021  ? CO2 22 04/08/2021  ? TSH 1.954 06/14/2014  ? ? ? ?Assessment & Plan:  ? ?Problem List Items Addressed This Visit   ? ?  ? Nervous and Auditory  ? Mucoid otitis media of right ear with effusion - Primary  ?  Mucoid effusion on exam.  Placing on Omnicef.  Referral placed  to ENT given patient's prior history.  ?  ?  ? Relevant Medications  ? cefdinir (OMNICEF) 300 MG capsule  ? Other Relevant Orders  ? Ambulatory referral to ENT  ? ? ?Meds ordered this encounter  ?Medications  ? cefdinir (OMNICEF) 300 MG capsule  ?  Sig: Take 1 capsule (300 mg total) by mouth 2 (two) times daily.  ?  Dispense:  14 capsule  ?  Refill:  0  ? ? ?Everlene Other DO ?[facility] ? ?

## 2021-12-03 NOTE — Assessment & Plan Note (Signed)
Mucoid effusion on exam.  Placing on Omnicef.  Referral placed to ENT given patient's prior history.  ?

## 2021-12-04 ENCOUNTER — Ambulatory Visit (INDEPENDENT_AMBULATORY_CARE_PROVIDER_SITE_OTHER): Payer: BC Managed Care – PPO | Admitting: *Deleted

## 2021-12-04 DIAGNOSIS — J309 Allergic rhinitis, unspecified: Secondary | ICD-10-CM

## 2021-12-06 ENCOUNTER — Telehealth: Payer: Self-pay | Admitting: Family Medicine

## 2021-12-06 NOTE — Telephone Encounter (Signed)
Patient was seen for ear pain on 4/11 and put on  Cefdnir 300 mg now she has loose stool. [facility] ?

## 2021-12-06 NOTE — Telephone Encounter (Signed)
Patient says she woke up this morning with loose stool., unsure how many times she went. She is having some discomfort in her stomach. Stools not watery.  ? ?Informed patient that spoke with Dr.Cook and he recommends to do a probiotic, eat yogurt and may use some imodium. Patient verbalized understanding. ?

## 2021-12-10 ENCOUNTER — Ambulatory Visit (INDEPENDENT_AMBULATORY_CARE_PROVIDER_SITE_OTHER): Payer: BC Managed Care – PPO

## 2021-12-10 DIAGNOSIS — J309 Allergic rhinitis, unspecified: Secondary | ICD-10-CM | POA: Diagnosis not present

## 2021-12-12 ENCOUNTER — Ambulatory Visit: Payer: BC Managed Care – PPO | Admitting: Family Medicine

## 2021-12-12 ENCOUNTER — Other Ambulatory Visit: Payer: Self-pay | Admitting: *Deleted

## 2021-12-12 VITALS — BP 122/81 | HR 65 | Temp 97.5°F | Ht 63.75 in | Wt 160.2 lb

## 2021-12-12 DIAGNOSIS — E785 Hyperlipidemia, unspecified: Secondary | ICD-10-CM

## 2021-12-12 MED ORDER — PRALUENT 75 MG/ML ~~LOC~~ SOAJ: SUBCUTANEOUS | 11 refills | Status: DC

## 2021-12-12 MED ORDER — FAMOTIDINE 40 MG PO TABS: 40.0000 mg | ORAL_TABLET | Freq: Every day | ORAL | 1 refills | Status: DC

## 2021-12-12 NOTE — Progress Notes (Signed)
? ?  Subjective:  ? ? Patient ID: Joyce Jacobs, female    DOB: [birth year], 61 y.o.   MRN: [id number] ? ?HPI ? ?Patient is having heart burn.  We did discuss preventative ways including diet mechanical including using a wedge and medication ? ?Discuss blood tests.  We did discuss recent lab work ? ?Needs ears to be checked. ? ?Review of Systems ? ?   ?Objective:  ? Physical Exam ? ?Right ear does have fluid behind it needs evaluation by ENT ?Lungs clear heart regular ? ? ?   ?Assessment & Plan:  ?She has familiar hyperlipidemia ?Doing very well on the current medication ?We will recheck cholesterol profile in the fall ?Recommend doing advance lipid profile through [facility] in the fall ? ?Otitis C ENT ? ?Migraine stable ? ?

## 2021-12-19 ENCOUNTER — Ambulatory Visit (INDEPENDENT_AMBULATORY_CARE_PROVIDER_SITE_OTHER): Payer: BC Managed Care – PPO

## 2021-12-19 DIAGNOSIS — J309 Allergic rhinitis, unspecified: Secondary | ICD-10-CM

## 2021-12-30 ENCOUNTER — Ambulatory Visit (INDEPENDENT_AMBULATORY_CARE_PROVIDER_SITE_OTHER): Payer: BC Managed Care – PPO

## 2021-12-30 DIAGNOSIS — J309 Allergic rhinitis, unspecified: Secondary | ICD-10-CM | POA: Diagnosis not present

## 2022-01-09 ENCOUNTER — Ambulatory Visit (INDEPENDENT_AMBULATORY_CARE_PROVIDER_SITE_OTHER): Payer: BC Managed Care – PPO

## 2022-01-09 DIAGNOSIS — J309 Allergic rhinitis, unspecified: Secondary | ICD-10-CM

## 2022-01-15 DIAGNOSIS — J3089 Other allergic rhinitis: Secondary | ICD-10-CM | POA: Diagnosis not present

## 2022-01-15 NOTE — Progress Notes (Signed)
VIALS EXP 01-16-23

## 2022-01-16 ENCOUNTER — Ambulatory Visit (INDEPENDENT_AMBULATORY_CARE_PROVIDER_SITE_OTHER): Payer: BC Managed Care – PPO

## 2022-01-16 DIAGNOSIS — J309 Allergic rhinitis, unspecified: Secondary | ICD-10-CM | POA: Diagnosis not present

## 2022-01-23 ENCOUNTER — Ambulatory Visit (INDEPENDENT_AMBULATORY_CARE_PROVIDER_SITE_OTHER): Payer: BC Managed Care – PPO

## 2022-01-23 DIAGNOSIS — J309 Allergic rhinitis, unspecified: Secondary | ICD-10-CM | POA: Diagnosis not present

## 2022-02-06 ENCOUNTER — Ambulatory Visit: Payer: BC Managed Care – PPO | Admitting: Allergy & Immunology

## 2022-02-06 ENCOUNTER — Encounter: Payer: Self-pay | Admitting: Allergy & Immunology

## 2022-02-06 ENCOUNTER — Ambulatory Visit (HOSPITAL_COMMUNITY)
Admission: RE | Admit: 2022-02-06 | Discharge: 2022-02-06 | Disposition: A | Discharge status: Home / Self Care | Payer: Self-pay | Source: Ambulatory Visit | Attending: Family Medicine | Admitting: Family Medicine

## 2022-02-06 ENCOUNTER — Other Ambulatory Visit (HOSPITAL_COMMUNITY): Payer: Self-pay | Admitting: Family Medicine

## 2022-02-06 VITALS — BP 128/68 | HR 86 | Temp 98.0°F | Ht 63.75 in | Wt 159.8 lb

## 2022-02-06 DIAGNOSIS — J309 Allergic rhinitis, unspecified: Secondary | ICD-10-CM

## 2022-02-06 DIAGNOSIS — K9049 Malabsorption due to intolerance, not elsewhere classified: Secondary | ICD-10-CM | POA: Diagnosis not present

## 2022-02-06 DIAGNOSIS — J4520 Mild intermittent asthma, uncomplicated: Secondary | ICD-10-CM | POA: Diagnosis not present

## 2022-02-06 DIAGNOSIS — E785 Hyperlipidemia, unspecified: Secondary | ICD-10-CM | POA: Insufficient documentation

## 2022-02-06 DIAGNOSIS — Z1231 Encounter for screening mammogram for malignant neoplasm of breast: Secondary | ICD-10-CM | POA: Insufficient documentation

## 2022-02-06 DIAGNOSIS — J302 Other seasonal allergic rhinitis: Secondary | ICD-10-CM

## 2022-02-06 DIAGNOSIS — L239 Allergic contact dermatitis, unspecified cause: Secondary | ICD-10-CM

## 2022-02-06 MED ORDER — PIMECROLIMUS 1 % EX CREA: TOPICAL_CREAM | Freq: Two times a day (BID) | CUTANEOUS | 2 refills | Status: DC

## 2022-02-06 MED ORDER — AZELASTINE HCL 0.1 % NA SOLN: NASAL | 5 refills | Status: DC

## 2022-02-06 NOTE — Progress Notes (Signed)
FOLLOW UP  Date of Service/Encounter:  02/06/22   Assessment:   Seasonal and perennial allergic rhinitis (grasses, ragweed, weeds, indoor molds, outdoor molds, dust mites, cat, dog, and cockroach) - on allergen immunotherapy with minimal improvement, consider increasing doses of pollens and her next vial   Allergic contact dermatitis - with a seasonal pattern of March through June annually for years (starting a nonsteroidal ointment to see if this will help)   Food intolernace (wheat) - with GI upset    Mild intermittent asthma, uncomplicated  Plan/Recommendations:   1. Seasonal and perennial allergic rhinitis - Continue allergy shots at the same schedule. - You are just on your first [employer], so next spring should be good.  - Continue with: Allegra (fexofenadine) '180mg'$  tablet once daily, Flonase (fluticasone) one spray per nostril on Mon/Wed/Fri, and continue Astelin (azelastine) 2 sprays per nostril 1-2 times daily as needed - You can use an extra dose of the antihistamine, if needed, for breakthrough symptoms.  - Consider nasal saline rinses 1-2 times daily to remove allergens from the nasal cavities as well as help with mucous clearance (this is especially helpful to do before the nasal sprays are given) -Continue allergy injections per protocol and have access to epinephrine autoinjector device.  2. Allergic contact dermatitis - We could consider patch testing in the future. - We are going to add on pimecrolimus twice daily as needed for the flares in the future. - This is not a steroid so it is safe to use on the face.  - Send me those pictures: [email or web address]  3. Mild intermittent asthma - Continue with albuterol as needed.   4. Return in about 6 months (around 08/08/2022).   Subjective:   Joyce Jacobs is a 61 y.o. female presenting today for follow up of No chief complaint on file.   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  Joyce Jacobs is a 61 y.o. female presenting for a follow up visit.  She was last seen in August 2022 by Althea Charon, one of our nurse practitioners.  At that time, she was continued on Allegra as well as Flonase and Astelin.  She was continued on allergy shots at the same schedule.  For her contact dermatitis, patch testing was discussed.  For her asthma, she was under good control with albuterol as needed.  Since last visit, she has largely done well.  However, she presents today for evaluation of a rash. She had an allergic reaction earlier this week. This started Monday night and she did not realize it. She took a "hot shower to wash off the school year". She does not think that this was anything that she used on her face. She tried each of her facial products separately and never reacted. This was back in March when she did this.  She had strawberries the night before. She also had a bouquet of flowers from a student that she actually likes. 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 She also went  hiking at the [employer]. She did have some coughing a couple of times.   Then in the spring, she was diagnosed with a sinus infection by the NP at her PCP's office. She was placed on antibiotics at that time. Two weeks later, she saw a doctor and her ears were painful. She went on a second antibiotic. She was referred to ENT and sees PA Jolene Provost. This was May 2023. She did not get a sinus CT. She has  had sinus surgery back when she lived in [place].   This happens regularly. She has had problems since he was in his teens around 63 years. She tried some hydrocortisone and this did not do much. Benadryl did help, but this seemed to only help with the itching. She also remains on the allegra daily. She typically starts having headaches with the Allegra.   She had another episode where she broke out in hives in 1980s. This was 6 weeks.  She has not broken out in hives since then.  Joints are going well.  She does not feel a ton of affect at this point, but she is just getting into her [place] is on allergen immunotherapy. She receives two injections. Immunotherapy script #1 contains weeds, grasses, dust mites, cat, and dog. She currently receives 0.22m of the RED vial (1/100). Immunotherapy script #2 contains  ragweed, molds, and cockroach. She currently receives 0.46mof the RED vial (1/100). She started shots July of 2022 and reached maintenance in April of 2023.  He has not had any large local reactions.  Otherwise, there have been no changes to her past medical history, surgical history, family history, or social history.    Review of Systems  Constitutional: Negative.  Negative for fever, malaise/fatigue and weight loss.  HENT:  Positive for congestion and ear pain. Negative for ear discharge.   Eyes:  Negative for pain, discharge and redness.  Respiratory:  Negative for cough, sputum production, shortness of breath and wheezing.   Cardiovascular: Negative.  Negative for chest pain and palpitations.  Gastrointestinal:  Negative for abdominal pain, heartburn, nausea and vomiting.  Skin: Negative.  Negative for itching and rash.  Neurological:  Negative for dizziness and headaches.  Endo/Heme/Allergies:  Positive for environmental allergies. Does not bruise/bleed easily.       Objective:   Blood pressure 128/68, pulse 86, temperature 98 F (36.7 C), height 5' 3.75" (1.619 m),  weight 159 lb 12.8 oz (72.5 kg), last menstrual period 03/05/2015, SpO2 98 %. Body mass index is 27.65 kg/m.    Physical Exam Vitals reviewed.  Constitutional:      Appearance: She is well-developed.     Comments: Very pleasant.  HENT:     Head: Normocephalic and atraumatic.     Right Ear: Ear canal and external ear normal. No drainage, swelling or tenderness. A middle ear effusion is present. Tympanic membrane is scarred. Tympanic membrane is not injected, erythematous, retracted or bulging.     Left Ear: Ear canal and external ear normal. No drainage, swelling or tenderness. A middle ear effusion is present. Tympanic membrane is scarred. Tympanic membrane is not injected, erythematous, retracted or bulging.     Nose: No nasal deformity, septal deviation, mucosal edema or rhinorrhea.     Right Turbinates: Enlarged, swollen and pale.     Left Turbinates: Enlarged, swollen and pale.     Right Sinus: No maxillary sinus tenderness or frontal sinus tenderness.     Left Sinus: No  maxillary sinus tenderness or frontal sinus tenderness.     Mouth/Throat:     Mouth: Mucous membranes are not pale and not dry.     Pharynx: Uvula midline.  Eyes:     General: Allergic shiner present.        Right eye: No discharge.        Left eye: No discharge.     Conjunctiva/sclera: Conjunctivae normal.     Right eye: Right conjunctiva is not injected. No chemosis.    Left eye: Left conjunctiva is not injected. No chemosis.    Pupils: Pupils are equal, round, and reactive to light.     Comments: No conjunctivitis.  Cardiovascular:     Rate and Rhythm: Normal rate and regular rhythm.     Heart sounds: Normal heart sounds.  Pulmonary:     Effort: Pulmonary effort is normal. No tachypnea, accessory muscle usage or respiratory distress.     Breath sounds: Normal breath sounds. No wheezing, rhonchi or rales.     Comments: Moving air well in all lung fields.  No increased work of breathing. Chest:     Chest  wall: No tenderness.  Lymphadenopathy:     Head:     Right side of head: No submandibular, tonsillar or occipital adenopathy.     Left side of head: No submandibular, tonsillar or occipital adenopathy.     Cervical: No cervical adenopathy.  Skin:    General: Skin is warm.     Capillary Refill: Capillary refill takes less than 2 seconds.     Coloration: Skin is not pale.     Findings: No abrasion, erythema, petechiae or rash. Rash is not papular, urticarial or vesicular.     Comments: No eczematous or urticarial lesions noted.  Neurological:     Mental Status: She is alert.      Diagnostic studies: none       Salvatore Marvel, MD  [facility]

## 2022-02-06 NOTE — Patient Instructions (Addendum)
1. Seasonal and perennial allergic rhinitis - Continue allergy shots at the same schedule. - You are just on your first [employer], so next spring should be good.  - Continue with: Allegra (fexofenadine) '180mg'$  tablet once daily, Flonase (fluticasone) one spray per nostril on Mon/Wed/Fri, and continue Astelin (azelastine) 2 sprays per nostril 1-2 times daily as needed - You can use an extra dose of the antihistamine, if needed, for breakthrough symptoms.  - Consider nasal saline rinses 1-2 times daily to remove allergens from the nasal cavities as well as help with mucous clearance (this is especially helpful to do before the nasal sprays are given) -Continue allergy injections per protocol and have access to epinephrine autoinjector device.  2. Allergic contact dermatitis - We could consider patch testing in the future. - We are going to add on pimecrolimus twice daily as needed for the flares in the future. - This is not a steroid so it is safe to use on the face.  - Send me those pictures: [email or web address]  3. Mild intermittent asthma - Continue with albuterol as needed.   4. Return in about 6 months (around 08/08/2022).    Please inform us of any Emergency Department visits, hospitalizations, or changes in symptoms. Call us before going to the ED for breathing or allergy symptoms since we might be able to fit you in for a sick visit. Feel free to contact us anytime with any questions, problems, or concerns.  It was a pleasure to see you again today!  Websites that have reliable patient information: 1. American Academy of Asthma, Allergy, and Immunology: www.aaaai.org 2. Food Allergy Research and Education (FARE): foodallergy.org 3. Mothers of Asthmatics: http://www.asthmacommunitynetwork.org 4. American College of Allergy, Asthma, and Immunology: www.acaai.org   COVID-19 Vaccine Information can be found at:  ShippingScam.co.uk For questions related to vaccine distribution or appointments, please email [email or web address] or call [phone].   We realize that you might be concerned about having an allergic reaction to the COVID19 vaccines. To help with that concern, WE ARE OFFERING THE COVID19 VACCINES IN OUR OFFICE! Ask the front desk for dates!     "Like" [place] on Facebook and Instagram for our latest updates!      A healthy democracy works best when [employer] participate! Make sure you are registered to vote! If you have moved or changed any of your contact information, you will need to get this updated before voting!  In some cases, you MAY be able to register to vote online: [email or web address]

## 2022-02-07 ENCOUNTER — Other Ambulatory Visit (HOSPITAL_COMMUNITY): Payer: Self-pay | Admitting: Family Medicine

## 2022-02-07 DIAGNOSIS — R928 Other abnormal and inconclusive findings on diagnostic imaging of breast: Secondary | ICD-10-CM

## 2022-02-11 ENCOUNTER — Ambulatory Visit (INDEPENDENT_AMBULATORY_CARE_PROVIDER_SITE_OTHER): Payer: BC Managed Care – PPO | Admitting: Family Medicine

## 2022-02-11 ENCOUNTER — Telehealth: Payer: Self-pay | Admitting: *Deleted

## 2022-02-11 ENCOUNTER — Ambulatory Visit (INDEPENDENT_AMBULATORY_CARE_PROVIDER_SITE_OTHER): Payer: BC Managed Care – PPO

## 2022-02-11 DIAGNOSIS — J309 Allergic rhinitis, unspecified: Secondary | ICD-10-CM | POA: Diagnosis not present

## 2022-02-11 DIAGNOSIS — R928 Other abnormal and inconclusive findings on diagnostic imaging of breast: Secondary | ICD-10-CM

## 2022-02-11 DIAGNOSIS — R9389 Abnormal findings on diagnostic imaging of other specified body structures: Secondary | ICD-10-CM

## 2022-02-11 DIAGNOSIS — I700 Atherosclerosis of aorta: Secondary | ICD-10-CM

## 2022-02-11 DIAGNOSIS — R931 Abnormal findings on diagnostic imaging of heart and coronary circulation: Secondary | ICD-10-CM

## 2022-02-11 NOTE — Progress Notes (Addendum)
Subjective:    Patient ID: Joyce Jacobs, female    DOB: 1961/01/24, 61 y.o.   MRN: [id number]  HPI  Patient calls to discuss the results of recent CT cardiac score. Patient would also like to discuss recent mammogram- patient is scheduled for [facility]  Virtual Visit via Telephone Note  I connected with Joyce Jacobs on 02/11/22 at  4:10 PM EDT by telephone and verified that I am speaking with the correct person using two identifiers.  Location: Patient: home Provider: office   I discussed the limitations, risks, security and privacy concerns of performing an evaluation and management service by telephone and the availability of in person appointments. I also discussed with the patient that there may be a patient responsible charge related to this service. The patient expressed understanding and agreed to proceed.   History of Present Illness:    Observations/Objective:   Assessment and Plan:   Follow Up Instructions:    I discussed the assessment and treatment plan with the patient. The patient was provided an opportunity to ask questions and all were answered. The patient agreed with the plan and demonstrated an understanding of the instructions.   The patient was advised to call back or seek an in-person evaluation if the symptoms worsen or if the condition fails to improve as anticipated.  I provided 15 minutes of non-face-to-face time during this encounter.      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 We are doing risk-management of her risk factors including Praluent she states after July 1 that we will switch to Repatha In addition to this we counseled regarding healthy eating regular activity Patient not having cardiac or blockage symptoms so therefore  no need currently to go to cardiology for further testing  Also discussion was held regarding her abnormal mammogram it is hard to differentiate when the calcifications occurred because with viewing the images it appears that they were there back in April 2022 she does have a diagnostic mammo and ultrasound coming up based upon this this will help delineate whether or not she needs a biopsy  Aortic atherosclerosis is noted on the CAT scan being treated with Praluent  Chronic anterior wedging of vertebral bodies needs to rule out osteoporosis

## 2022-02-11 NOTE — Telephone Encounter (Signed)
Ms. charlese, gruetzmacher are scheduled for a virtual visit with your provider today.    Just as we do with appointments in the office, we must obtain your consent to participate.  Your consent will be active for this visit and any virtual visit you may have with one of our providers in the next 365 days.    If you have a MyChart account, I can also send a copy of this consent to you electronically.  All virtual visits are billed to your insurance company just like a traditional visit in the office.  As this is a virtual visit, video technology does not allow for your provider to perform a traditional examination.  This may limit your provider's ability to fully assess your condition.  If your provider identifies any concerns that need to be evaluated in person or the need to arrange testing such as labs, EKG, etc, we will make arrangements to do so.    Although advances in technology are sophisticated, we cannot ensure that it will always work on either your end or our end.  If the connection with a video visit is poor, we may have to switch to a telephone visit.  With either a video or telephone visit, we are not always able to ensure that we have a secure connection.   I need to obtain your verbal consent now.   Are you willing to proceed with your visit today?   Tris Beswick has provided verbal consent on 02/11/2022 for a virtual visit (video or telephone).

## 2022-02-12 ENCOUNTER — Other Ambulatory Visit: Payer: Self-pay | Admitting: Allergy & Immunology

## 2022-02-12 NOTE — Addendum Note (Signed)
Addended by: Vicente Males on: 02/12/2022 03:08 PM   Modules accepted: Orders

## 2022-02-12 NOTE — Progress Notes (Signed)
Bone Density test scheduled for 02/17/22 at 9:30 at [facility]. MyChart message has been sent to patient.

## 2022-02-13 ENCOUNTER — Encounter (HOSPITAL_COMMUNITY): Payer: Self-pay

## 2022-02-13 ENCOUNTER — Ambulatory Visit (HOSPITAL_COMMUNITY)
Admission: RE | Admit: 2022-02-13 | Discharge: 2022-02-13 | Disposition: A | Discharge status: Home / Self Care | Payer: BC Managed Care – PPO | Source: Ambulatory Visit | Attending: Family Medicine | Admitting: Family Medicine

## 2022-02-13 ENCOUNTER — Encounter (HOSPITAL_COMMUNITY): Payer: BC Managed Care – PPO

## 2022-02-13 DIAGNOSIS — R928 Other abnormal and inconclusive findings on diagnostic imaging of breast: Secondary | ICD-10-CM

## 2022-02-14 ENCOUNTER — Encounter: Payer: Self-pay | Admitting: Family Medicine

## 2022-02-14 ENCOUNTER — Telehealth: Payer: Self-pay | Admitting: Family Medicine

## 2022-02-14 NOTE — Telephone Encounter (Signed)
Call patient regarding her mammogram results

## 2022-02-17 ENCOUNTER — Other Ambulatory Visit (HOSPITAL_COMMUNITY): Payer: Self-pay | Admitting: Family Medicine

## 2022-02-17 ENCOUNTER — Ambulatory Visit (INDEPENDENT_AMBULATORY_CARE_PROVIDER_SITE_OTHER): Payer: BC Managed Care – PPO

## 2022-02-17 ENCOUNTER — Ambulatory Visit (HOSPITAL_COMMUNITY)
Admission: RE | Admit: 2022-02-17 | Discharge: 2022-02-17 | Disposition: A | Discharge status: Home / Self Care | Payer: BC Managed Care – PPO | Source: Ambulatory Visit | Attending: Family Medicine | Admitting: Family Medicine

## 2022-02-17 ENCOUNTER — Other Ambulatory Visit: Payer: Self-pay | Admitting: Family Medicine

## 2022-02-17 DIAGNOSIS — J309 Allergic rhinitis, unspecified: Secondary | ICD-10-CM | POA: Diagnosis not present

## 2022-02-17 DIAGNOSIS — R9389 Abnormal findings on diagnostic imaging of other specified body structures: Secondary | ICD-10-CM | POA: Diagnosis present

## 2022-02-17 DIAGNOSIS — R921 Mammographic calcification found on diagnostic imaging of breast: Secondary | ICD-10-CM

## 2022-02-17 DIAGNOSIS — R928 Other abnormal and inconclusive findings on diagnostic imaging of breast: Secondary | ICD-10-CM

## 2022-02-18 ENCOUNTER — Encounter: Payer: Self-pay | Admitting: Family Medicine

## 2022-02-22 HISTORY — PX: BREAST BIOPSY: SHX20

## 2022-02-24 ENCOUNTER — Ambulatory Visit
Admission: RE | Admit: 2022-02-24 | Discharge: 2022-02-24 | Disposition: A | Discharge status: Home / Self Care | Payer: BC Managed Care – PPO | Source: Ambulatory Visit | Attending: Family Medicine | Admitting: Family Medicine

## 2022-02-24 DIAGNOSIS — R928 Other abnormal and inconclusive findings on diagnostic imaging of breast: Secondary | ICD-10-CM

## 2022-02-24 DIAGNOSIS — R921 Mammographic calcification found on diagnostic imaging of breast: Secondary | ICD-10-CM

## 2022-02-27 ENCOUNTER — Ambulatory Visit (INDEPENDENT_AMBULATORY_CARE_PROVIDER_SITE_OTHER): Payer: BC Managed Care – PPO

## 2022-02-27 ENCOUNTER — Ambulatory Visit (HOSPITAL_COMMUNITY): Payer: BC Managed Care – PPO

## 2022-02-27 ENCOUNTER — Encounter (HOSPITAL_COMMUNITY): Payer: BC Managed Care – PPO

## 2022-02-27 DIAGNOSIS — J309 Allergic rhinitis, unspecified: Secondary | ICD-10-CM | POA: Diagnosis not present

## 2022-03-03 ENCOUNTER — Encounter: Payer: Self-pay | Admitting: Family Medicine

## 2022-03-12 ENCOUNTER — Ambulatory Visit (INDEPENDENT_AMBULATORY_CARE_PROVIDER_SITE_OTHER): Payer: BC Managed Care – PPO | Admitting: *Deleted

## 2022-03-12 DIAGNOSIS — J309 Allergic rhinitis, unspecified: Secondary | ICD-10-CM

## 2022-03-14 ENCOUNTER — Encounter: Payer: Self-pay | Admitting: Family Medicine

## 2022-03-18 MED ORDER — REPATHA 140 MG/ML ~~LOC~~ SOSY: 140.0000 mg | PREFILLED_SYRINGE | SUBCUTANEOUS | 5 refills | Status: DC

## 2022-03-18 NOTE — Telephone Encounter (Signed)
Nurses Due to insurance switch of what medicine they are covering please note the following #1-stop Praluent #2 start Repatha 140 mg subcutaneous every 2 weeks May send in 1 month prescription with 6 additional refills thank you-Dr. Nicki Reaper

## 2022-03-20 ENCOUNTER — Encounter: Payer: Self-pay | Admitting: Family Medicine

## 2022-03-20 ENCOUNTER — Encounter: Payer: Self-pay | Admitting: Allergy & Immunology

## 2022-03-20 ENCOUNTER — Ambulatory Visit (INDEPENDENT_AMBULATORY_CARE_PROVIDER_SITE_OTHER): Payer: BC Managed Care – PPO

## 2022-03-20 DIAGNOSIS — J309 Allergic rhinitis, unspecified: Secondary | ICD-10-CM

## 2022-03-20 DIAGNOSIS — Z1321 Encounter for screening for nutritional disorder: Secondary | ICD-10-CM

## 2022-03-20 DIAGNOSIS — E785 Hyperlipidemia, unspecified: Secondary | ICD-10-CM

## 2022-03-24 ENCOUNTER — Ambulatory Visit (INDEPENDENT_AMBULATORY_CARE_PROVIDER_SITE_OTHER): Payer: BC Managed Care – PPO

## 2022-03-24 DIAGNOSIS — J309 Allergic rhinitis, unspecified: Secondary | ICD-10-CM | POA: Diagnosis not present

## 2022-03-27 NOTE — Telephone Encounter (Signed)
Patient has been scheduled for next Monday 03-31-2022 with Webb Silversmith!

## 2022-03-27 NOTE — Telephone Encounter (Signed)
Nurses Patient would like to have detailed lipid profile this is completed through [facility] inform the patient where that is (across the street from the ER in the double story building) Liver profile, Cardio Lipid, vitamin D  Diagnosis hyperlipidemia, vitamin D deficiency Thank you

## 2022-03-31 ENCOUNTER — Encounter: Payer: Self-pay | Admitting: Family Medicine

## 2022-03-31 ENCOUNTER — Ambulatory Visit: Payer: BC Managed Care – PPO | Admitting: Family Medicine

## 2022-03-31 VITALS — BP 118/80 | HR 63 | Temp 98.2°F | Resp 16

## 2022-03-31 DIAGNOSIS — L235 Allergic contact dermatitis due to other chemical products: Secondary | ICD-10-CM

## 2022-03-31 NOTE — Progress Notes (Signed)
Follow-up Note  RE: Lavonne Kinderman MRN: [id number] DOB: 08/04/1961 Date of Office Visit: 03/31/2022  Primary care provider: Kathyrn Drown, MD Referring provider: Kathyrn Drown, MD   Dalene returns to the office today for the patch test placement, given suspected history of contact dermatitis.  She was last seen in this clinic on 02/06/2022 by Dr. Ernst Bowler for evaluation of asthma, allergic rhinitis, contact dermatitis occurring March through June, and food intolerance to wheat.  Her last environmental allergy testing was on 02/06/2021 and was positive to grass pollen, weed pollen, ragweed pollen, mold, dust mite, cat, dog, and cockroach.  She began allergen immunotherapy on 02/27/2021   Diagnostics: True Test patches placed.   Plan:   Allergic contact dermatitis - Instructions provided on care of the patches for the next 48 hours. Barbera Setters was instructed to avoid showering for the next 48 hours. Barbera Setters will follow up in 48 hours and 96 hours for patch readings.     Call the clinic if this treatment plan is not working well for you  Follow up in 2 days or sooner if needed.

## 2022-03-31 NOTE — Patient Instructions (Signed)
  Diagnostics: True Test patches placed.   Plan:   Allergic contact dermatitis - Instructions provided on care of the patches for the next 48 hours. Joyce Jacobs was instructed to avoid showering for the next 48 hours. Joyce Jacobs will follow up in 48 hours and 96 hours for patch readings.    Call the clinic if this treatment plan is not working well for you  Follow up in 2 days or sooner if needed.

## 2022-03-31 NOTE — Telephone Encounter (Signed)
Noted thank you

## 2022-04-02 ENCOUNTER — Encounter: Payer: Self-pay | Admitting: Family

## 2022-04-02 ENCOUNTER — Ambulatory Visit: Payer: BC Managed Care – PPO | Admitting: Family

## 2022-04-02 ENCOUNTER — Encounter: Payer: Self-pay | Admitting: Family Medicine

## 2022-04-02 ENCOUNTER — Ambulatory Visit: Payer: BC Managed Care – PPO | Admitting: Family Medicine

## 2022-04-02 ENCOUNTER — Ambulatory Visit (INDEPENDENT_AMBULATORY_CARE_PROVIDER_SITE_OTHER): Payer: BC Managed Care – PPO

## 2022-04-02 VITALS — BP 131/73 | HR 64 | Temp 97.2°F | Ht 63.75 in | Wt 165.0 lb

## 2022-04-02 VITALS — Temp 98.1°F

## 2022-04-02 DIAGNOSIS — L235 Allergic contact dermatitis due to other chemical products: Secondary | ICD-10-CM

## 2022-04-02 DIAGNOSIS — M25561 Pain in right knee: Secondary | ICD-10-CM | POA: Diagnosis not present

## 2022-04-02 DIAGNOSIS — J309 Allergic rhinitis, unspecified: Secondary | ICD-10-CM | POA: Diagnosis not present

## 2022-04-02 MED ORDER — DICLOFENAC SODIUM 75 MG PO TBEC: 75.0000 mg | DELAYED_RELEASE_TABLET | Freq: Two times a day (BID) | ORAL | 0 refills | Status: DC

## 2022-04-02 NOTE — Progress Notes (Signed)
Joyce Jacobs returns to the office today for the final patch test interpretation, given suspected history of contact dermatitis.    Diagnostics:   TRUE TEST 48-hour hour reading: (+/-) #6 Fragrance mix   Plan:   Allergic contact dermatitis - The patient has been provided detailed information regarding the substances she is sensitive to, as well as products containing the substances.   - Meticulous avoidance of these substances is recommended.  - If avoidance is not possible, the use of barrier creams or lotions is recommended. - If symptoms persist or progress despite meticulous avoidance of Fragrance Mix, Dermatology Referral may be warranted.  Althea Charon, FNP [facility] [place]

## 2022-04-02 NOTE — Progress Notes (Signed)
Subjective:    Patient ID: Joyce Jacobs, female    DOB: May 03, 1961, 61 y.o.   MRN: [id number]  HPI Right knee pain and stiffness x 2 weeks while walking Knee pain discomfort with walking.  Does not have any injury.  Does a lot of walking every day.  Denies giving way but has locked up some.  No numbness or tingling  Review of Systems     Objective:   Physical Exam Ankle normal calf normal right knee labral tenderness slight click but no clunk ligaments are stable   It is possible this could be a cartilage issue is also part of the knee possible to be osteoarthritis or overuse.  I do not feel that there is any sign of cancer or other serious issues going on currently ligaments are stable    Assessment & Plan:  Cartilage issue Anti-inflammatory next 2 weeks Give [place] feedback within 2 weeks how things go Cool compresses after use If progressive troubles or if worse may need orthopedics may need x-ray Hold off on injection currently

## 2022-04-04 ENCOUNTER — Encounter: Payer: Self-pay | Admitting: Family Medicine

## 2022-04-04 ENCOUNTER — Ambulatory Visit (INDEPENDENT_AMBULATORY_CARE_PROVIDER_SITE_OTHER): Payer: BC Managed Care – PPO | Admitting: Family Medicine

## 2022-04-04 DIAGNOSIS — L235 Allergic contact dermatitis due to other chemical products: Secondary | ICD-10-CM | POA: Diagnosis not present

## 2022-04-04 NOTE — Progress Notes (Signed)
Follow-up Note  RE: Joyce Jacobs MRN: [id number] DOB: [birth year] Date of Office Visit: 04/04/2022  Primary care provider: Babs Sciara, MD Referring provider: Babs Sciara, MD   Joyce Jacobs returns to the office today for the final patch test interpretation, given suspected history of contact dermatitis.    Diagnostics:   TRUE TEST 96-hour hour reading:  positive reaction to  #6 (Fragrance mix)   Plan:   Allergic contact dermatitis - The patient has been provided detailed information regarding the substances she is sensitive to, as well as products containing the substances.   - Meticulous avoidance of these substances is recommended.  - If avoidance is not possible, the use of barrier creams or lotions is recommended. - If symptoms persist or progress despite meticulous avoidance of fragrance mix, a dermatology referral may be warranted. - An e-mail has been sent to you detailing an extensive list of products that are fragrance mix free.   Thank you for the opportunity to care for this patient.  Please do not hesitate to contact me with questions.  Thermon Leyland, FNP [facility]

## 2022-04-04 NOTE — Patient Instructions (Signed)
  Diagnostics:   TRUE TEST 96-hour hour reading: positive reaction to  #6 (Fragrance mix)   Plan:   Allergic contact dermatitis - The patient has been provided detailed information regarding the substances she is sensitive to, as well as products containing the substances.   - Meticulous avoidance of these substances is recommended.  - If avoidance is not possible, the use of barrier creams or lotions is recommended. - If symptoms persist or progress despite meticulous avoidance of fragrance mix, a dermatology referral may be warranted.  Call the clinic if this treatment plan is not working well for you  Follow up in 3 months or sooner if needed.

## 2022-04-08 ENCOUNTER — Ambulatory Visit (INDEPENDENT_AMBULATORY_CARE_PROVIDER_SITE_OTHER): Payer: BC Managed Care – PPO | Admitting: *Deleted

## 2022-04-08 DIAGNOSIS — J309 Allergic rhinitis, unspecified: Secondary | ICD-10-CM | POA: Diagnosis not present

## 2022-04-11 LAB — CARDIO IQ(R) ADVANCED LIPID PANEL
Apolipoprotein B: 101 mg/dL — ABNORMAL HIGH (ref <90)
Cholesterol: 206 mg/dL — ABNORMAL HIGH (ref <200)
HDL LARGE: 6700 nmol/L — ABNORMAL LOW (ref >6729)
HDL: 58 mg/dL (ref >49)
LDL Cholesterol (Calc): 118 mg/dL (calc) — ABNORMAL HIGH (ref <100)
LDL Medium: 387 nmol/L — ABNORMAL HIGH (ref <215)
LDL Particle Number: 1701 nmol/L — ABNORMAL HIGH (ref <1138)
LDL Peak Size: 213.9 Angstrom — ABNORMAL LOW (ref >222.9)
LDL Small: 381 nmol/L — ABNORMAL HIGH (ref <142)
Lipoprotein (a): 165 nmol/L — ABNORMAL HIGH (ref <75)
Non-HDL Cholesterol (Calc): 148 mg/dL (calc) — ABNORMAL HIGH (ref <130)
Total CHOL/HDL Ratio: 3.6 calc (ref <5.0)
Triglycerides: 186 mg/dL — ABNORMAL HIGH (ref <150)

## 2022-04-11 LAB — HEPATIC FUNCTION PANEL
AG Ratio: 1.9 (calc) (ref 1.0–2.5)
ALT: 10 U/L (ref 6–29)
AST: 14 U/L (ref 10–35)
Albumin: 4.2 g/dL (ref 3.6–5.1)
Alkaline phosphatase (APISO): 64 U/L (ref 37–153)
Bilirubin, Direct: 0.1 mg/dL (ref 0.0–0.2)
Globulin: 2.2 g/dL (calc) (ref 1.9–3.7)
Indirect Bilirubin: 0.3 mg/dL (calc) (ref 0.2–1.2)
Total Bilirubin: 0.4 mg/dL (ref 0.2–1.2)
Total Protein: 6.4 g/dL (ref 6.1–8.1)

## 2022-04-11 LAB — VITAMIN D 25 HYDROXY (VIT D DEFICIENCY, FRACTURES): Vit D, 25-Hydroxy: 42 ng/mL (ref 30–100)

## 2022-04-17 ENCOUNTER — Ambulatory Visit (INDEPENDENT_AMBULATORY_CARE_PROVIDER_SITE_OTHER): Payer: BC Managed Care – PPO

## 2022-04-17 DIAGNOSIS — J309 Allergic rhinitis, unspecified: Secondary | ICD-10-CM

## 2022-04-24 ENCOUNTER — Ambulatory Visit (INDEPENDENT_AMBULATORY_CARE_PROVIDER_SITE_OTHER): Payer: BC Managed Care – PPO

## 2022-04-24 DIAGNOSIS — J309 Allergic rhinitis, unspecified: Secondary | ICD-10-CM

## 2022-04-30 ENCOUNTER — Ambulatory Visit: Payer: Self-pay | Admitting: *Deleted

## 2022-04-30 DIAGNOSIS — J3089 Other allergic rhinitis: Secondary | ICD-10-CM | POA: Diagnosis not present

## 2022-04-30 DIAGNOSIS — J309 Allergic rhinitis, unspecified: Secondary | ICD-10-CM

## 2022-04-30 NOTE — Progress Notes (Signed)
VIALS EXP 05-01-23

## 2022-05-08 ENCOUNTER — Ambulatory Visit: Payer: BC Managed Care – PPO | Admitting: Allergy & Immunology

## 2022-05-12 ENCOUNTER — Encounter: Payer: Self-pay | Admitting: Family Medicine

## 2022-05-12 DIAGNOSIS — M25561 Pain in right knee: Secondary | ICD-10-CM

## 2022-05-12 NOTE — Telephone Encounter (Signed)
Nurses I read over [facility] note-certainly we want the need to get back to normal.  It is possible she may need to have imaging or injections. I suspect what is going on is a cartilage related issue Could be early osteoarthritis as well It would be advisable to get orthopedic consultation I would recommend referral to [facility] for further consultation-I believe that would be the next best step to getting [facility] better. Thanks-Dr. Nicki Reaper

## 2022-05-20 ENCOUNTER — Ambulatory Visit (INDEPENDENT_AMBULATORY_CARE_PROVIDER_SITE_OTHER): Payer: BC Managed Care – PPO | Admitting: *Deleted

## 2022-05-20 DIAGNOSIS — J309 Allergic rhinitis, unspecified: Secondary | ICD-10-CM | POA: Diagnosis not present

## 2022-06-01 ENCOUNTER — Other Ambulatory Visit: Payer: Self-pay | Admitting: Family Medicine

## 2022-06-04 ENCOUNTER — Ambulatory Visit (INDEPENDENT_AMBULATORY_CARE_PROVIDER_SITE_OTHER): Payer: BC Managed Care – PPO

## 2022-06-04 DIAGNOSIS — J309 Allergic rhinitis, unspecified: Secondary | ICD-10-CM | POA: Diagnosis not present

## 2022-06-11 ENCOUNTER — Ambulatory Visit (INDEPENDENT_AMBULATORY_CARE_PROVIDER_SITE_OTHER): Payer: BC Managed Care – PPO | Admitting: *Deleted

## 2022-06-11 DIAGNOSIS — J309 Allergic rhinitis, unspecified: Secondary | ICD-10-CM | POA: Diagnosis not present

## 2022-06-18 ENCOUNTER — Ambulatory Visit (INDEPENDENT_AMBULATORY_CARE_PROVIDER_SITE_OTHER): Payer: BC Managed Care – PPO | Admitting: *Deleted

## 2022-06-18 DIAGNOSIS — J309 Allergic rhinitis, unspecified: Secondary | ICD-10-CM

## 2022-06-26 ENCOUNTER — Ambulatory Visit (INDEPENDENT_AMBULATORY_CARE_PROVIDER_SITE_OTHER): Payer: BC Managed Care – PPO

## 2022-06-26 DIAGNOSIS — J309 Allergic rhinitis, unspecified: Secondary | ICD-10-CM

## 2022-07-02 ENCOUNTER — Ambulatory Visit (INDEPENDENT_AMBULATORY_CARE_PROVIDER_SITE_OTHER): Payer: BC Managed Care – PPO | Admitting: *Deleted

## 2022-07-02 DIAGNOSIS — J309 Allergic rhinitis, unspecified: Secondary | ICD-10-CM

## 2022-07-09 ENCOUNTER — Ambulatory Visit (INDEPENDENT_AMBULATORY_CARE_PROVIDER_SITE_OTHER): Payer: BC Managed Care – PPO | Admitting: *Deleted

## 2022-07-09 DIAGNOSIS — J309 Allergic rhinitis, unspecified: Secondary | ICD-10-CM | POA: Diagnosis not present

## 2022-07-23 ENCOUNTER — Ambulatory Visit (INDEPENDENT_AMBULATORY_CARE_PROVIDER_SITE_OTHER): Payer: BC Managed Care – PPO

## 2022-07-23 DIAGNOSIS — J309 Allergic rhinitis, unspecified: Secondary | ICD-10-CM | POA: Diagnosis not present

## 2022-08-07 ENCOUNTER — Ambulatory Visit (INDEPENDENT_AMBULATORY_CARE_PROVIDER_SITE_OTHER): Payer: BC Managed Care – PPO

## 2022-08-07 DIAGNOSIS — J309 Allergic rhinitis, unspecified: Secondary | ICD-10-CM | POA: Diagnosis not present

## 2022-08-14 ENCOUNTER — Encounter: Payer: Self-pay | Admitting: Family Medicine

## 2022-08-21 ENCOUNTER — Ambulatory Visit: Payer: BC Managed Care – PPO | Admitting: Allergy & Immunology

## 2022-08-29 ENCOUNTER — Ambulatory Visit (INDEPENDENT_AMBULATORY_CARE_PROVIDER_SITE_OTHER): Payer: BC Managed Care – PPO | Admitting: Family Medicine

## 2022-08-29 VITALS — BP 129/69 | HR 66 | Temp 98.5°F | Ht 63.75 in | Wt 165.0 lb

## 2022-08-29 DIAGNOSIS — J019 Acute sinusitis, unspecified: Secondary | ICD-10-CM

## 2022-08-29 DIAGNOSIS — I700 Atherosclerosis of aorta: Secondary | ICD-10-CM | POA: Diagnosis not present

## 2022-08-29 MED ORDER — BACLOFEN 10 MG PO TABS
10.0000 mg | ORAL_TABLET | Freq: Three times a day (TID) | ORAL | 5 refills | Status: DC | PRN
Start: 2022-08-29 — End: 2023-07-07

## 2022-08-29 MED ORDER — SUMATRIPTAN SUCCINATE 100 MG PO TABS: ORAL_TABLET | ORAL | 3 refills | Status: DC

## 2022-08-29 MED ORDER — AMOXICILLIN-POT CLAVULANATE 875-125 MG PO TABS: 1.0000 | ORAL_TABLET | Freq: Two times a day (BID) | ORAL | 0 refills | Status: DC

## 2022-08-29 MED ORDER — FAMOTIDINE 40 MG PO TABS: 40.0000 mg | ORAL_TABLET | Freq: Every day | ORAL | 3 refills | Status: DC

## 2022-08-29 MED ORDER — REPATHA 140 MG/ML ~~LOC~~ SOSY
140.0000 mg | PREFILLED_SYRINGE | SUBCUTANEOUS | 5 refills | Status: DC
Start: 2022-08-29 — End: 2023-03-03

## 2022-08-29 NOTE — Progress Notes (Signed)
Subjective:    Patient ID: Joyce Jacobs, female    DOB: 1960/12/01, 62 y.o.   MRN: [id number]  HPI Ear pain and post nasal drip  Had covid 08/13/22 with nasal congestion Started off with COVID before Christmas then progressed into head congestion drainage sinus pressure no wheezing no difficulty breathing no high fevers  Review of Systems     Objective:   Physical Exam Gen-NAD not toxic TMS-normal bilateral T- normal no redness Chest-CTA respiratory rate normal no crackles CV RRR no murmur Skin-warm dry Neuro-grossly normal  Mild sinus tenderness       Assessment & Plan:   Viral COVID with secondary effects Sinusitis Antibiotics prescribed Warning signs discussed Follow-up if progressive troubles Refills on her other medicines given Lipid profile in January A standard follow-up by middle of the year Female wellness with [facility] [facility]  Elevated coronary calcium continue injectable for cholesterol Coronary calcium healthy diet continue medication

## 2022-09-02 ENCOUNTER — Ambulatory Visit: Payer: BC Managed Care – PPO | Admitting: Allergy & Immunology

## 2022-09-03 ENCOUNTER — Ambulatory Visit (INDEPENDENT_AMBULATORY_CARE_PROVIDER_SITE_OTHER): Payer: BC Managed Care – PPO

## 2022-09-03 DIAGNOSIS — J309 Allergic rhinitis, unspecified: Secondary | ICD-10-CM | POA: Diagnosis not present

## 2022-09-04 DIAGNOSIS — J3089 Other allergic rhinitis: Secondary | ICD-10-CM

## 2022-09-04 NOTE — Progress Notes (Signed)
VIALS EXP 09-05-23 

## 2022-09-09 ENCOUNTER — Encounter: Payer: Self-pay | Admitting: Family Medicine

## 2022-09-10 NOTE — Telephone Encounter (Signed)
Nurses Please move forward with the following You may also share this message with [place]  It is possible that this could be a localized allergic reaction but it is also possible that it could be due to something else I would recommend stopping the Augmentin If still having congestion and sinus symptoms doxycycline 1 twice daily for 7 days would be reasonable with a snack and a tall glass of water  As for the possibility of an allergic reaction Augmentin I would also recommend strong consideration for seeing Dr. Ernst Bowler a allergist for further evaluation.  This does not need to be done currently but could be done several weeks down the road when all of this settles.  He can do a specific test to see if this is a allergy to Augmentin.  The reason why that would be important is if she is not allergic to Augmentin that would allow her to still have access to that medicine in the future for sinus infections and other illnesses Other wise if we declare a true allergy to Augmentin without knowing for certain then in the future cannot use penicillin, amoxicillin or Augmentin which limits how many antibiotics we can use  If Joyce Jacobs is open to seeing an allergist go ahead with referral to Dr. Ernst Bowler thanks  If the area is itching May use Benadryl as needed steroid creams would not really do much of anything.  Feel free to keep [place] posted thank you

## 2022-09-11 MED ORDER — DOXYCYCLINE HYCLATE 100 MG PO TABS: ORAL_TABLET | ORAL | 0 refills | Status: DC

## 2022-09-17 ENCOUNTER — Ambulatory Visit (INDEPENDENT_AMBULATORY_CARE_PROVIDER_SITE_OTHER): Payer: BC Managed Care – PPO | Admitting: *Deleted

## 2022-09-17 DIAGNOSIS — J309 Allergic rhinitis, unspecified: Secondary | ICD-10-CM

## 2022-10-01 ENCOUNTER — Ambulatory Visit (INDEPENDENT_AMBULATORY_CARE_PROVIDER_SITE_OTHER): Payer: BC Managed Care – PPO

## 2022-10-01 DIAGNOSIS — J309 Allergic rhinitis, unspecified: Secondary | ICD-10-CM | POA: Diagnosis not present

## 2022-10-01 LAB — LIPID PANEL
Chol/HDL Ratio: 3.7 ratio (ref 0.0–4.4)
Cholesterol, Total: 206 mg/dL — ABNORMAL HIGH (ref 100–199)
HDL: 55 mg/dL (ref >39)
LDL Chol Calc (NIH): 132 mg/dL — ABNORMAL HIGH (ref 0–99)
Triglycerides: 109 mg/dL (ref 0–149)
VLDL Cholesterol Cal: 19 mg/dL (ref 5–40)

## 2022-10-09 ENCOUNTER — Ambulatory Visit: Payer: BC Managed Care – PPO | Admitting: Allergy & Immunology

## 2022-10-15 ENCOUNTER — Ambulatory Visit (INDEPENDENT_AMBULATORY_CARE_PROVIDER_SITE_OTHER): Payer: BC Managed Care – PPO

## 2022-10-15 DIAGNOSIS — J309 Allergic rhinitis, unspecified: Secondary | ICD-10-CM | POA: Diagnosis not present

## 2022-10-16 ENCOUNTER — Encounter: Payer: Self-pay | Admitting: Family Medicine

## 2022-10-16 DIAGNOSIS — E785 Hyperlipidemia, unspecified: Secondary | ICD-10-CM

## 2022-10-16 DIAGNOSIS — Z79899 Other long term (current) drug therapy: Secondary | ICD-10-CM

## 2022-10-16 NOTE — Telephone Encounter (Signed)
Nurses Please add Zetia generic 10 mg 1 daily, #90, 1 refill She may continue Repatha Go ahead and put in orders for lipid liver she can do this in approximately 3 months Thanks-Dr. Nicki Reaper

## 2022-10-17 MED ORDER — EZETIMIBE 10 MG PO TABS: ORAL_TABLET | ORAL | 0 refills | Status: DC

## 2022-10-22 ENCOUNTER — Ambulatory Visit (INDEPENDENT_AMBULATORY_CARE_PROVIDER_SITE_OTHER): Payer: BC Managed Care – PPO

## 2022-10-22 DIAGNOSIS — J309 Allergic rhinitis, unspecified: Secondary | ICD-10-CM | POA: Diagnosis not present

## 2022-10-28 ENCOUNTER — Ambulatory Visit: Payer: BC Managed Care – PPO | Admitting: Allergy & Immunology

## 2022-10-28 ENCOUNTER — Encounter: Payer: Self-pay | Admitting: Allergy & Immunology

## 2022-10-28 ENCOUNTER — Other Ambulatory Visit: Payer: Self-pay

## 2022-10-28 VITALS — BP 130/70 | HR 60 | Temp 97.9°F | Resp 12 | Wt 163.8 lb

## 2022-10-28 DIAGNOSIS — K9049 Malabsorption due to intolerance, not elsewhere classified: Secondary | ICD-10-CM

## 2022-10-28 DIAGNOSIS — L239 Allergic contact dermatitis, unspecified cause: Secondary | ICD-10-CM | POA: Diagnosis not present

## 2022-10-28 DIAGNOSIS — J4520 Mild intermittent asthma, uncomplicated: Secondary | ICD-10-CM

## 2022-10-28 DIAGNOSIS — J309 Allergic rhinitis, unspecified: Secondary | ICD-10-CM

## 2022-10-28 DIAGNOSIS — J302 Other seasonal allergic rhinitis: Secondary | ICD-10-CM

## 2022-10-28 DIAGNOSIS — R21 Rash and other nonspecific skin eruption: Secondary | ICD-10-CM

## 2022-10-28 NOTE — Progress Notes (Unsigned)
FOLLOW UP  Date of Service/Encounter:  10/28/22   Assessment:   Seasonal and perennial allergic rhinitis (grasses, ragweed, weeds, indoor molds, outdoor molds, dust mites, cat, dog, and cockroach) - on allergen immunotherapy with minimal improvement, consider increasing doses of pollens and her next vial   Allergic contact dermatitis - with a seasonal pattern of March through June annually for years (starting a nonsteroidal ointment to see if this will help)   Food intolernace (wheat) - with GI upset    Mild intermittent asthma, uncomplicated  Plan/Recommendations:    There are no Patient Instructions on file for this visit.   Subjective:   Joyce Jacobs is a 62 y.o. female presenting today for follow up of No chief complaint on file.   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  Joyce Orthoptist Ski) is a 62 y.o. female presenting for a follow up visit.  She was last seen in August 2023 at which time she underwent patch testing.  She was positive to fragrance mix and that was it.  She reports that she is concerned about an Augmentin allergy today. She had COVID19 in December 21st. She did not feel that she needed Paxlovid. Then she got a sinus infection after that. Around five days after starting the Augmentin, she developed some redness and a roughed area of skin under her right side of  her face. She continued for two more days and then stopped the Augmentin. She did not have breathing problems with this at all. She had no stomach pain or widespread hives. She had previously had Augmentin on a number of occasions. She does not routinely get sinus infections at this point; the allergy shots and our treatments have helped her.  She has not had amoxicillin to her knowledge. I did look throuhg her Epic and she has a note from her gycenologist in 2016 that noted an allergy to Augmentin. She thinks that she was inadvertently prescribed Augmentin and tolerated it and she kept using it.   She was also using the fragrance free Neutrogena bar at the same time. She she pushed that aside that Neutrogena bar. She is now using the Dove body bar. She is using her CAMP list. This has been   {Blank single:19197::"Asthma/Respiratory Symptom History: ***"," "}  {Blank single:19197::"Allergic Rhinitis Symptom History: ***"," "}  Shots are going well.  She does not feel a ton of affect at this point, but she is just getting into her [place] is on allergen immunotherapy. She receives two injections. Immunotherapy script #1 contains weeds, grasses, dust mites, cat, and dog. She currently receives 0.30m of the RED vial (1/100). Immunotherapy script #2 contains  ragweed, molds, and cockroach. She currently receives 0.478mof the RED vial (1/100). She started shots July of 2022 and reached maintenance in April of 2023.  He has not had any large local reactions.   {Blank single:19197::"Food Allergy Symptom History: ***"," "}  Skin Symptom  History: We had previously started pimecrolimus twice daily as needed.  {Blank single:19197::"GERD Symptom History: ***"," "}  Otherwise, there have been no changes to her past medical history, surgical history, family history, or social history.    ROS     Objective:   Last menstrual period 03/05/2015. There is no height or weight on file to calculate  BMI.    Physical Exam   Diagnostic studies: {Blank single:19197::"none","deferred due to recent antihistamine use","labs sent instead"," "}  Spirometry: {Blank single:19197::"results normal (FEV1: ***%, FVC: ***%, FEV1/FVC: ***%)","results abnormal (FEV1: ***%, FVC: ***%, FEV1/FVC: ***%)"}.    {Blank single:19197::"Spirometry consistent with mild obstructive disease","Spirometry consistent with moderate obstructive disease","Spirometry consistent with severe obstructive disease","Spirometry consistent with possible restrictive disease","Spirometry consistent with mixed obstructive and restrictive disease","Spirometry uninterpretable due to technique","Spirometry consistent with normal pattern"}. {Blank single:19197::"Albuterol/Atrovent nebulizer","Xopenex/Atrovent nebulizer","Albuterol nebulizer","Albuterol four puffs via MDI","Xopenex four puffs via MDI"} treatment given in clinic with {Blank single:19197::"significant improvement in FEV1 per ATS criteria","significant improvement in FVC per ATS criteria","significant improvement in FEV1 and FVC per ATS criteria","improvement in FEV1, but not significant per ATS criteria","improvement in FVC, but not significant per ATS criteria","improvement in FEV1 and FVC, but not significant per ATS criteria","no improvement"}.  Allergy Studies: {Blank single:19197::"none","labs sent instead"," "}    {Blank single:19197::"Allergy testing results were read and interpreted by myself, documented by clinical staff."," "}      Salvatore Marvel, MD  [facility]

## 2022-10-28 NOTE — Patient Instructions (Addendum)
1. Seasonal and perennial allergic rhinitis - We are going to continue with the allergy shots at the same schedule. - We are going to increase your new vial build ups to 0.1 mL and then 0.3 mL and then 0.5 mL.  - Continue with: Allegra (fexofenadine) '180mg'$  tablet once daily AS NEEDED, Flonase (fluticasone) one spray per nostril AS NEEDED, and continue Astelin (azelastine) 2 sprays per nostril 1-2 times daily AS NEEDED - You can use an extra dose of the antihistamine, if needed, for breakthrough symptoms.  - Consider nasal saline rinses 1-2 times daily to remove allergens from the nasal cavities as well as help with mucous clearance (this is especially helpful to do before the nasal sprays are given)  2. Allergic contact dermatitis - Continue to avoid fragrance mix.  - Continue with Elidel twice daily as needed (this is at the pharmacy if you need it).   3. Mild intermittent asthma - Continue with albuterol as needed.   4. Return in about 22 days (around 11/19/2022).    Please inform us of any Emergency Department visits, hospitalizations, or changes in symptoms. Call us before going to the ED for breathing or allergy symptoms since we might be able to fit you in for a sick visit. Feel free to contact us anytime with any questions, problems, or concerns.  It was a pleasure to see you again today!  Websites that have reliable patient information: 1. American Academy of Asthma, Allergy, and Immunology: www.aaaai.org 2. Food Allergy Research and Education (FARE): foodallergy.org 3. Mothers of Asthmatics: http://www.asthmacommunitynetwork.org 4. American College of Allergy, Asthma, and Immunology: www.acaai.org   COVID-19 Vaccine Information can be found at: ShippingScam.co.uk For questions related to vaccine distribution or appointments, please email [email or web address] or call [phone].   We realize that you might be  concerned about having an allergic reaction to the COVID19 vaccines. To help with that concern, WE ARE OFFERING THE COVID19 VACCINES IN OUR OFFICE! Ask the front desk for dates!     "Like" [place] on Facebook and Instagram for our latest updates!      A healthy democracy works best when [employer] participate! Make sure you are registered to vote! If you have moved or changed any of your contact information, you will need to get this updated before voting!  In some cases, you MAY be able to register to vote online: [email or web address]

## 2022-10-29 ENCOUNTER — Encounter: Payer: Self-pay | Admitting: Allergy & Immunology

## 2022-10-29 ENCOUNTER — Ambulatory Visit: Payer: BC Managed Care – PPO | Admitting: Family Medicine

## 2022-10-29 ENCOUNTER — Ambulatory Visit (HOSPITAL_COMMUNITY)
Admission: RE | Admit: 2022-10-29 | Discharge: 2022-10-29 | Disposition: A | Discharge status: Home / Self Care | Payer: BC Managed Care – PPO | Source: Ambulatory Visit | Attending: Family Medicine | Admitting: Family Medicine

## 2022-10-29 VITALS — BP 128/79 | Ht 63.75 in | Wt 163.4 lb

## 2022-10-29 DIAGNOSIS — R519 Headache, unspecified: Secondary | ICD-10-CM | POA: Insufficient documentation

## 2022-10-29 MED ORDER — AZELASTINE HCL 0.1 % NA SOLN
NASAL | 5 refills | Status: DC
Start: 2022-10-29 — End: 2023-07-07

## 2022-10-29 MED ORDER — NURTEC 75 MG PO TBDP
75.0000 mg | ORAL_TABLET | ORAL | 1 refills | Status: DC
Start: 2022-10-29 — End: 2022-11-04

## 2022-10-29 NOTE — Progress Notes (Addendum)
Subjective:  Patient ID: Joyce Jacobs, female    DOB: 1960-10-10  Age: 62 y.o. MRN: [id number]  CC: Chief Complaint  Patient presents with   Migraine    Started Sunday- has tried Baclofen, ibuprofen, imitex, Aleve    HPI:  62 year old female with a history of migraine presents for evaluation of the above.   Patient has a history of migraine.  Uses Imitrex.  Patient states that on Sunday she developed a severe headache with intercourse.  She states that the pain has been posteriorly and on the top of her head.  Described as throbbing/pulsating.  This is different from her typical migraine.  She does not typically have headaches with intercourse.  She states that she has taken Imitrex, Aleve, and baclofen with improvement but she still having residual pain.  As of late she has been having a migraine approximately 1 time a week.  Reports nausea and photophobia.  No vomiting.  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   Migraine headache 04/06/2013    Social Hx   Social History   Socioeconomic History   Marital status: Married    Spouse name: Not on file   Number of children: Not on file   Years of education: Not on file   Highest education level: Not on file  Occupational History   Not on file  Tobacco Use   Smoking status: Never   Smokeless tobacco: Never  Vaping Use   Vaping Use: Never used  Substance and Sexual Activity   Alcohol use: Yes    Comment: social   Drug use: No   Sexual activity: Yes    Birth control/protection: None  Other  Topics Concern   Not on file  Social History Narrative   Not on file   Social Determinants of Health   Financial Resource Strain: Not on file  Food Insecurity: Not on file  Transportation Needs: Not on file  Physical Activity: Not on file  Stress: Not on file  Social Connections: Not on file    Review of Systems Per HPI  Objective:  BP 128/79   Ht 5' 3.75" (1.619 m)   Wt 163 lb 6.4 oz (74.1 kg)   LMP 03/05/2015   BMI 28.27 kg/m      10/29/2022    3:59 PM 10/29/2022    3:58 PM 10/28/2022    5:01 PM  BP/Weight  Systolic BP 128 145 130  Diastolic BP 79 78 70  Wt. (Lbs)  163.4 163.8  BMI  28.27 kg/m2 28.34 kg/m2    Physical Exam Vitals and nursing note reviewed.  Constitutional:      General: She is not in acute distress.    Appearance: Normal appearance.  HENT:     Head: Normocephalic and atraumatic.  Eyes:     General:        Right eye: No discharge.        Left eye: No discharge.     Conjunctiva/sclera: Conjunctivae normal.     Pupils: Pupils are equal, round, and reactive to light.     Comments: Photophobia noted.  Cardiovascular:     Rate and Rhythm: Normal rate and regular rhythm.  Pulmonary:     Effort: Pulmonary effort is normal.     Breath sounds: Normal breath sounds. No wheezing, rhonchi or rales.  Neurological:     General: No focal deficit present.     Mental Status: She is alert.     Cranial Nerves: No cranial nerve deficit.     Motor: No weakness.     Lab Results  Component Value Date   WBC 5.0 02/23/2018   HGB 13.8 02/23/2018   HCT 41.5 02/23/2018   PLT 217 02/23/2018   GLUCOSE 98 04/08/2021   CHOL 206 (H) 09/30/2022   TRIG 109 09/30/2022   HDL 55 09/30/2022   LDLCALC 132 (H) 09/30/2022   ALT 10 04/03/2022   AST 14 04/03/2022   NA 141 04/08/2021   K 4.6 04/08/2021   CL 105 04/08/2021   CREATININE 0.67 04/08/2021   BUN 14 04/08/2021   CO2 22 04/08/2021   TSH 1.954 06/14/2014     Assessment & Plan:   Problem List Items  Addressed This Visit       Other   Acute intractable headache - Primary    Headache associated with intercourse.  Per current guideline recommendations, needs CT imaging to rule out intracranial process.  Arranging CT scan for further evaluation.   We discussed additional pharmacotherapy regarding headache and migraine.  She can use Imitrex.  I am also sending in Nurtec.      Relevant Medications   Rimegepant Sulfate (NURTEC) 75 MG TBDP   Other Relevant Orders   CT HEAD WO CONTRAST ( )    Meds ordered this encounter  Medications   Rimegepant Sulfate (NURTEC) 75 MG TBDP    Sig: Take 1 tablet (75 mg total) by mouth every other day.    Dispense:  60 tablet    Refill:  1   Follow-up:  1-2 weeks (or sooner pending CT findings)  30 mins spent during this encounter; this included history, physical exam, consultation with her primary care physician, arranging CT scan, and discussion of therapeutic options regarding headaches.  Everlene Other DO [facility]

## 2022-10-29 NOTE — Assessment & Plan Note (Signed)
Headache associated with intercourse.  Per current guideline recommendations, needs CT imaging to rule out intracranial process.  Arranging CT scan for further evaluation.   We discussed additional pharmacotherapy regarding headache and migraine.  She can use Imitrex.  I am also sending in Nurtec.

## 2022-10-29 NOTE — Patient Instructions (Signed)
We will call with CT results.  Medication as directed.  Imitrex as needed.  Follow up in 1-2 weeks

## 2022-11-04 ENCOUNTER — Other Ambulatory Visit: Payer: Self-pay | Admitting: *Deleted

## 2022-11-04 MED ORDER — NURTEC 75 MG PO TBDP: 75.0000 mg | ORAL_TABLET | ORAL | 0 refills | Status: DC

## 2022-11-05 ENCOUNTER — Ambulatory Visit (INDEPENDENT_AMBULATORY_CARE_PROVIDER_SITE_OTHER): Payer: BC Managed Care – PPO

## 2022-11-05 DIAGNOSIS — J309 Allergic rhinitis, unspecified: Secondary | ICD-10-CM

## 2022-11-12 ENCOUNTER — Ambulatory Visit (INDEPENDENT_AMBULATORY_CARE_PROVIDER_SITE_OTHER): Payer: BC Managed Care – PPO

## 2022-11-12 DIAGNOSIS — J309 Allergic rhinitis, unspecified: Secondary | ICD-10-CM | POA: Diagnosis not present

## 2022-11-19 ENCOUNTER — Encounter: Payer: BC Managed Care – PPO | Admitting: Internal Medicine

## 2022-11-25 ENCOUNTER — Ambulatory Visit (INDEPENDENT_AMBULATORY_CARE_PROVIDER_SITE_OTHER): Payer: BC Managed Care – PPO

## 2022-11-25 DIAGNOSIS — J309 Allergic rhinitis, unspecified: Secondary | ICD-10-CM

## 2022-12-10 ENCOUNTER — Ambulatory Visit (INDEPENDENT_AMBULATORY_CARE_PROVIDER_SITE_OTHER): Payer: BC Managed Care – PPO

## 2022-12-10 DIAGNOSIS — J309 Allergic rhinitis, unspecified: Secondary | ICD-10-CM | POA: Diagnosis not present

## 2022-12-24 ENCOUNTER — Ambulatory Visit (INDEPENDENT_AMBULATORY_CARE_PROVIDER_SITE_OTHER): Payer: BC Managed Care – PPO | Admitting: *Deleted

## 2022-12-24 DIAGNOSIS — J309 Allergic rhinitis, unspecified: Secondary | ICD-10-CM | POA: Diagnosis not present

## 2022-12-29 DIAGNOSIS — J3089 Other allergic rhinitis: Secondary | ICD-10-CM | POA: Diagnosis not present

## 2022-12-30 NOTE — Progress Notes (Signed)
VIALS EXP 12-30-23 

## 2022-12-31 ENCOUNTER — Encounter: Payer: Self-pay | Admitting: Family Medicine

## 2022-12-31 DIAGNOSIS — Z1211 Encounter for screening for malignant neoplasm of colon: Secondary | ICD-10-CM

## 2022-12-31 NOTE — Telephone Encounter (Signed)
Nurses Please go ahead with referral for colonoscopy for Joyce Jacobs with Dr.Rourk   Thanks-Dr. Lorin Picket  Please send her a notification that this has been initiated thank you

## 2023-01-02 ENCOUNTER — Encounter: Payer: Self-pay | Admitting: *Deleted

## 2023-01-07 ENCOUNTER — Ambulatory Visit (INDEPENDENT_AMBULATORY_CARE_PROVIDER_SITE_OTHER): Payer: BC Managed Care – PPO | Admitting: *Deleted

## 2023-01-07 DIAGNOSIS — J309 Allergic rhinitis, unspecified: Secondary | ICD-10-CM | POA: Diagnosis not present

## 2023-01-17 ENCOUNTER — Other Ambulatory Visit: Payer: Self-pay | Admitting: Family Medicine

## 2023-01-17 LAB — LIPID PANEL
Chol/HDL Ratio: 2.7 ratio (ref 0.0–4.4)
Cholesterol, Total: 158 mg/dL (ref 100–199)
HDL: 59 mg/dL (ref >39)
LDL Chol Calc (NIH): 77 mg/dL (ref 0–99)
Triglycerides: 122 mg/dL (ref 0–149)
VLDL Cholesterol Cal: 22 mg/dL (ref 5–40)

## 2023-01-17 LAB — HEPATIC FUNCTION PANEL
ALT: 12 IU/L (ref 0–32)
AST: 18 IU/L (ref 0–40)
Albumin: 4.4 g/dL (ref 3.9–4.9)
Alkaline Phosphatase: 67 IU/L (ref 44–121)
Bilirubin Total: 0.4 mg/dL (ref 0.0–1.2)
Bilirubin, Direct: 0.12 mg/dL (ref 0.00–0.40)
Total Protein: 6.7 g/dL (ref 6.0–8.5)

## 2023-01-21 ENCOUNTER — Ambulatory Visit (INDEPENDENT_AMBULATORY_CARE_PROVIDER_SITE_OTHER): Payer: BC Managed Care – PPO | Admitting: *Deleted

## 2023-01-21 DIAGNOSIS — J309 Allergic rhinitis, unspecified: Secondary | ICD-10-CM | POA: Diagnosis not present

## 2023-02-04 ENCOUNTER — Ambulatory Visit (INDEPENDENT_AMBULATORY_CARE_PROVIDER_SITE_OTHER): Payer: BC Managed Care – PPO

## 2023-02-04 DIAGNOSIS — J309 Allergic rhinitis, unspecified: Secondary | ICD-10-CM

## 2023-02-17 ENCOUNTER — Ambulatory Visit (INDEPENDENT_AMBULATORY_CARE_PROVIDER_SITE_OTHER): Payer: BC Managed Care – PPO | Admitting: *Deleted

## 2023-02-17 DIAGNOSIS — J309 Allergic rhinitis, unspecified: Secondary | ICD-10-CM | POA: Diagnosis not present

## 2023-02-23 ENCOUNTER — Other Ambulatory Visit: Payer: Self-pay | Admitting: Family Medicine

## 2023-02-23 DIAGNOSIS — Z1231 Encounter for screening mammogram for malignant neoplasm of breast: Secondary | ICD-10-CM

## 2023-02-25 ENCOUNTER — Ambulatory Visit
Admission: RE | Admit: 2023-02-25 | Discharge: 2023-02-25 | Disposition: A | Discharge status: Home / Self Care | Payer: BC Managed Care – PPO | Source: Ambulatory Visit | Attending: Family Medicine | Admitting: Family Medicine

## 2023-02-25 ENCOUNTER — Ambulatory Visit (INDEPENDENT_AMBULATORY_CARE_PROVIDER_SITE_OTHER): Payer: BC Managed Care – PPO | Admitting: *Deleted

## 2023-02-25 DIAGNOSIS — J309 Allergic rhinitis, unspecified: Secondary | ICD-10-CM | POA: Diagnosis not present

## 2023-02-25 DIAGNOSIS — Z1231 Encounter for screening mammogram for malignant neoplasm of breast: Secondary | ICD-10-CM

## 2023-03-02 ENCOUNTER — Ambulatory Visit: Payer: BC Managed Care – PPO | Admitting: Nurse Practitioner

## 2023-03-02 ENCOUNTER — Encounter: Payer: Self-pay | Admitting: Nurse Practitioner

## 2023-03-02 VITALS — BP 117/70 | HR 61 | Temp 97.5°F | Ht 63.75 in | Wt 156.0 lb

## 2023-03-02 DIAGNOSIS — Z01419 Encounter for gynecological examination (general) (routine) without abnormal findings: Secondary | ICD-10-CM

## 2023-03-02 DIAGNOSIS — R5383 Other fatigue: Secondary | ICD-10-CM

## 2023-03-02 DIAGNOSIS — Z01411 Encounter for gynecological examination (general) (routine) with abnormal findings: Secondary | ICD-10-CM

## 2023-03-02 DIAGNOSIS — E2839 Other primary ovarian failure: Secondary | ICD-10-CM | POA: Diagnosis not present

## 2023-03-02 NOTE — Progress Notes (Unsigned)
Subjective:    Patient ID: Joyce Jacobs, female    DOB: February 06, 1961, 62 y.o.   MRN: [id number]  HPI The patient comes in today for a wellness visit.    A review of their health history was completed.  A review of medications was also completed.  Any needed refills; estradiol topical  Eating habits: good  Falls/  MVA accidents in past few months: no  Regular exercise: walks 4 x week  Specialist pt sees on regular basis: allergist for shots  Preventative health issues were discussed.   Additional concerns: concern with fatigue and iron studies, also change repatha to injector pen from syringe  Married, same female sexual partner. Has had hysterectomy with BSO.  Mammogram UTD. Bone Density UTD. Regular vision and dental exams.     03/02/2023   10:51 AM  Depression screen PHQ 2/9  Decreased Interest 0  Down, Depressed, Hopeless 0  PHQ - 2 Score 0  Altered sleeping 0  Tired, decreased energy 2  Change in appetite 0  Feeling bad or failure about yourself  0  Trouble concentrating 0  Moving slowly or fidgety/restless 0  Suicidal thoughts 0  PHQ-9 Score 2  Difficult doing work/chores Not difficult at all      03/02/2023   10:51 AM  GAD 7 : Generalized Anxiety Score  Nervous, Anxious, on Edge 0  Control/stop worrying 0  Worry too much - different things 0  Trouble relaxing 0  Restless 0  Easily annoyed or irritable 0  Afraid - awful might happen 0  Total GAD 7 Score 0  Anxiety Difficulty Not difficult at all      Review of Systems  Constitutional:  Positive for fatigue. Negative for activity change and appetite change.  HENT:  Negative for sore throat and trouble swallowing.   Respiratory:  Negative for cough, chest tightness, shortness of breath and wheezing.   Cardiovascular:  Negative for chest pain.  Gastrointestinal:  Negative for abdominal distention, abdominal pain, constipation, diarrhea, nausea and vomiting.  Genitourinary:  Negative for difficulty  urinating, dysuria, enuresis, frequency, genital sores, pelvic pain, urgency and vaginal discharge.       Objective:   Physical Exam Vitals and nursing note reviewed.  Constitutional:      General: She is not in acute distress.    Appearance: She is well-developed.  Neck:     Thyroid: No thyromegaly.     Trachea: No tracheal deviation.     Comments: Thyroid non tender to palpation. No mass or goiter noted.  Cardiovascular:     Rate and Rhythm: Normal rate and regular rhythm.     Heart sounds: Normal heart sounds. No murmur heard. Pulmonary:     Effort: Pulmonary effort is normal.     Breath sounds: Normal breath sounds.  Chest:  Breasts:    Right: No swelling, inverted nipple, mass, skin change or tenderness.     Left: No swelling, inverted nipple, mass, skin change or tenderness.  Abdominal:     General: There is no distension.     Palpations: Abdomen is soft.     Tenderness: There is no abdominal tenderness.  Genitourinary:    Comments: External GU: no rashes, lesions or discoloration. Vagina: pale and slightly dry. Bimanual exam: no tenderness.  Musculoskeletal:     Cervical back: Normal range of motion and neck supple.  Lymphadenopathy:     Cervical: No cervical adenopathy.     Upper Body:     Right upper body:  No supraclavicular, axillary or pectoral adenopathy.     Left upper body: No supraclavicular, axillary or pectoral adenopathy.  Skin:    General: Skin is warm and dry.     Findings: No rash.  Neurological:     Mental Status: She is alert and oriented to person, place, and time.  Psychiatric:        Mood and Affect: Mood normal.        Behavior: Behavior normal.        Thought Content: Thought content normal.        Judgment: Judgment normal.    Today's Vitals   03/02/23 1036  BP: 117/70  Pulse: 61  Temp: (!) 97.5 F (36.4 C)  SpO2: 99%  Weight: 156 lb (70.8 kg)  Height: 5' 3.75" (1.619 m)   Body mass index is 26.99 kg/m.         Assessment  & Plan:   Problem List Items Addressed This Visit       Other   Hypoestrogenism   Relevant Medications   estradiol (ESTRACE) 0.1 MG/GM vaginal cream   Other Visit Diagnoses     Well woman exam    -  Primary   Relevant Orders   CBC with Differential   Comprehensive metabolic panel   TSH + free T4   Fatigue, unspecified type       Relevant Orders   TSH + free T4      Screening labs due to fatigue.  RF Estrace cream for hypoestrogenism. Continue healthy diet and regular activity. Meds ordered this encounter  Medications   estradiol (ESTRACE) 0.1 MG/GM vaginal cream    Sig: Use twice a week as directed prn    Dispense:  42.5 g    Refill:  5   Evolocumab (REPATHA SURECLICK) 140 MG/ML SOAJ    Sig: Inject 140 mg into the skin every 14 (fourteen) days.    Dispense:  2 mL    Refill:  5    Order Specific Question:   Supervising Provider    Answer:   Lilyan Punt A [[id number]]   Switched to prefilled injectable Repatha. Return in about 1 year (around 03/01/2024) for physical.

## 2023-03-03 ENCOUNTER — Encounter: Payer: Self-pay | Admitting: Nurse Practitioner

## 2023-03-03 DIAGNOSIS — E2839 Other primary ovarian failure: Secondary | ICD-10-CM | POA: Insufficient documentation

## 2023-03-03 MED ORDER — REPATHA SURECLICK 140 MG/ML ~~LOC~~ SOAJ: 140.0000 mg | SUBCUTANEOUS | 5 refills | Status: DC

## 2023-03-03 MED ORDER — ESTRADIOL 0.1 MG/GM VA CREA: TOPICAL_CREAM | VAGINAL | 5 refills | Status: DC

## 2023-03-04 ENCOUNTER — Ambulatory Visit (INDEPENDENT_AMBULATORY_CARE_PROVIDER_SITE_OTHER): Payer: BC Managed Care – PPO

## 2023-03-04 DIAGNOSIS — J309 Allergic rhinitis, unspecified: Secondary | ICD-10-CM | POA: Diagnosis not present

## 2023-03-05 LAB — CBC WITH DIFFERENTIAL/PLATELET
Basophils Absolute: 0.1 10*3/uL (ref 0.0–0.2)
Basos: 1 %
EOS (ABSOLUTE): 0.2 10*3/uL (ref 0.0–0.4)
Eos: 3 %
Hematocrit: 43 % (ref 34.0–46.6)
Hemoglobin: 13.8 g/dL (ref 11.1–15.9)
Immature Grans (Abs): 0 10*3/uL (ref 0.0–0.1)
Immature Granulocytes: 0 %
Lymphocytes Absolute: 2.1 10*3/uL (ref 0.7–3.1)
Lymphs: 34 %
MCH: 29.7 pg (ref 26.6–33.0)
MCHC: 32.1 g/dL (ref 31.5–35.7)
MCV: 93 fL (ref 79–97)
Monocytes Absolute: 0.4 10*3/uL (ref 0.1–0.9)
Monocytes: 7 %
Neutrophils Absolute: 3.3 10*3/uL (ref 1.4–7.0)
Neutrophils: 55 %
Platelets: 241 10*3/uL (ref 150–450)
RBC: 4.65 x10E6/uL (ref 3.77–5.28)
RDW: 12.8 % (ref 11.7–15.4)
WBC: 6.1 10*3/uL (ref 3.4–10.8)

## 2023-03-05 LAB — COMPREHENSIVE METABOLIC PANEL
ALT: 11 IU/L (ref 0–32)
AST: 15 IU/L (ref 0–40)
Albumin: 4.4 g/dL (ref 3.9–4.9)
Alkaline Phosphatase: 64 IU/L (ref 44–121)
BUN/Creatinine Ratio: 21 (ref 12–28)
BUN: 15 mg/dL (ref 8–27)
Bilirubin Total: 0.3 mg/dL (ref 0.0–1.2)
CO2: 23 mmol/L (ref 20–29)
Calcium: 9.1 mg/dL (ref 8.7–10.3)
Chloride: 104 mmol/L (ref 96–106)
Creatinine, Ser: 0.73 mg/dL (ref 0.57–1.00)
Globulin, Total: 2 g/dL (ref 1.5–4.5)
Glucose: 98 mg/dL (ref 70–99)
Potassium: 4.6 mmol/L (ref 3.5–5.2)
Sodium: 139 mmol/L (ref 134–144)
Total Protein: 6.4 g/dL (ref 6.0–8.5)
eGFR: 92 mL/min/{1.73_m2} (ref >59)

## 2023-03-05 LAB — TSH+FREE T4
Free T4: 1.17 ng/dL (ref 0.82–1.77)
TSH: 2.5 u[IU]/mL (ref 0.450–4.500)

## 2023-03-09 ENCOUNTER — Other Ambulatory Visit: Payer: Self-pay | Admitting: Family Medicine

## 2023-03-17 ENCOUNTER — Ambulatory Visit (INDEPENDENT_AMBULATORY_CARE_PROVIDER_SITE_OTHER): Payer: BC Managed Care – PPO | Admitting: *Deleted

## 2023-03-17 DIAGNOSIS — J309 Allergic rhinitis, unspecified: Secondary | ICD-10-CM | POA: Diagnosis not present

## 2023-04-06 ENCOUNTER — Encounter: Payer: Self-pay | Admitting: Family Medicine

## 2023-04-06 ENCOUNTER — Ambulatory Visit: Payer: BC Managed Care – PPO | Admitting: Family Medicine

## 2023-04-06 ENCOUNTER — Ambulatory Visit (INDEPENDENT_AMBULATORY_CARE_PROVIDER_SITE_OTHER): Payer: BC Managed Care – PPO

## 2023-04-06 VITALS — BP 116/62 | HR 61 | Temp 97.3°F | Ht 63.75 in | Wt 159.0 lb

## 2023-04-06 DIAGNOSIS — R5383 Other fatigue: Secondary | ICD-10-CM | POA: Diagnosis not present

## 2023-04-06 DIAGNOSIS — E785 Hyperlipidemia, unspecified: Secondary | ICD-10-CM | POA: Diagnosis not present

## 2023-04-06 DIAGNOSIS — R931 Abnormal findings on diagnostic imaging of heart and coronary circulation: Secondary | ICD-10-CM

## 2023-04-06 DIAGNOSIS — J309 Allergic rhinitis, unspecified: Secondary | ICD-10-CM

## 2023-04-06 NOTE — Progress Notes (Addendum)
Subjective:    Patient ID: Joyce Jacobs, female    DOB: [birth year], 62 y.o.   MRN: [id number]  HPI Patient would like to discuss basic health and cholesterol and heart related test done in 2023 and medications  Patient has questions regarding her cholesterol Also regarding the coronary calcium In addition to this recent bone density as well and endplate compressions of the thoracic spine  Review of Systems     Objective:   Physical Exam .  Time spent today discussing the issues       Assessment & Plan:   Osteopenia Endplate compression of the thoracic spine Does not follow the category of biphosphonate's currently Repeat bone density in 2025 May need to be on a biphosphonate at that point  Could not tolerate statins Currently on Repatha doing well On Zetia as well Goal is to get LDL below 70 Healthy diet recommended Lab work in November Follow-up by later in the spring  Coronary calcium not in the range of severe buildup or blockage No need for catheterization stent or cardiology consult currently Risk factors discussed in detail  Patient with mild fatigue but no sleep apnea symptoms  We did touch base regarding colonoscopy she stated that she will be connecting with gastroenterology to help set this up

## 2023-04-06 NOTE — Addendum Note (Signed)
Addended by: Alm Bustard R on: 04/06/2023 01:18 PM   Modules accepted: Orders

## 2023-04-09 ENCOUNTER — Telehealth: Payer: Self-pay | Admitting: Internal Medicine

## 2023-04-09 NOTE — Telephone Encounter (Signed)
Questionnaire is in review.

## 2023-04-10 NOTE — Telephone Encounter (Signed)
Procedure: colonoscopy  Estimated body mass index is 27.51 kg/m as calculated from the following:   Height as of 04/06/23: 5' 3.75" (1.619 m).   Weight as of 04/06/23: 159 lb (72.1 kg).   Have you had a colonoscopy before?  05/12/11, Dr. Jena Gauss  Do you have family history of colon cancer?  no  Do you have a family history of polyps? no  Previous colonoscopy with polyps removed? no  Do you have a history colorectal cancer?   no  Are you diabetic?  no  Do you have a prosthetic or mechanical heart valve? no  Do you have a pacemaker/defibrillator?   no  Have you had endocarditis/atrial fibrillation?  no  Do you use supplemental oxygen/CPAP?  no  Have you had joint replacement within the last 12 months?  no  Do you tend to be constipated or have to use laxatives?  no   Do you have history of alcohol use? If yes, how much and how often.  no  Do you have history or are you using drugs? If yes, what do are you  using?  no  Have you ever had a stroke/heart attack?  no  Have you ever had a heart or other vascular stent placed,?no  Do you take weight loss medication? no  female patients,: have you had a hysterectomy? yes                              are you post menopausal?  no                              do you still have your menstrual cycle? no    Date of last menstrual period?   Do you take any blood-thinning medications such as: (Plavix, aspirin, Coumadin, Aggrenox, Brilinta, Xarelto, Eliquis, Pradaxa, Savaysa or Effient)? no  If yes we need the name, milligram, dosage and who is prescribing doctor:               Current Outpatient Medications  Medication Sig Dispense Refill   albuterol (VENTOLIN HFA) 108 (90 Base) MCG/ACT inhaler inhale 2 puffs every 4-6 hours as needed for cough, wheeze, tightness in chest, shortness of breath 1 each 1   azelastine (ASTELIN) 0.1 % nasal spray 2 sprays per nostril 1-2 times daily as needed. 30 mL 5   baclofen (LIORESAL) 10 MG tablet  Take 1 tablet (10 mg total) by mouth 3 (three) times daily as needed. for muscle spams 90 tablet 5   dextromethorphan-guaiFENesin (MUCINEX DM) 30-600 MG 12hr tablet Take 2 tablets by mouth as needed for cough.     diclofenac (VOLTAREN) 75 MG EC tablet Take 1 tablet (75 mg total) by mouth 2 (two) times daily. 30 tablet 0   EPINEPHRINE 0.3 mg/0.3 mL IJ SOAJ injection INJECT 0.3 MG INTO THE MUSCLE ONCE FOR 1 DOSE 2 each 0   estradiol (ESTRACE) 0.1 MG/GM vaginal cream Use twice a week as directed prn 42.5 g 5   Evolocumab (REPATHA SURECLICK) 140 MG/ML SOAJ Inject 140 mg into the skin every 14 (fourteen) days. 2 mL 5   ezetimibe (ZETIA) 10 MG tablet Take 1 tablet by mouth once daily 90 tablet 1   famotidine (PEPCID) 40 MG tablet Take 1 tablet (40 mg total) by mouth at bedtime. 90 tablet 3   fexofenadine (ALLEGRA) 180 MG tablet Take 180 mg by  mouth daily.     naproxen sodium (ANAPROX) 220 MG tablet Take 440 mg by mouth daily as needed (headache).     Olopatadine HCl (PATADAY) 0.2 % SOLN Insert 1 drop into each eye daily for allergies. 2.5 mL 2   ondansetron (ZOFRAN) 8 MG tablet Take 1 tablet (8 mg total) by mouth every 8 (eight) hours as needed for nausea or vomiting. 20 tablet 1   OVER THE COUNTER MEDICATION Take 1 tablet by mouth daily. Doterra Supplements: Microplex MVP     OVER THE COUNTER MEDICATION Take 1 tablet by mouth daily. Doterra Supplements: Zeomega Essential Oils     pimecrolimus (ELIDEL) 1 % cream Apply topically 2 (two) times daily. 100 g 2   Rimegepant Sulfate (NURTEC) 75 MG TBDP Take 1 tablet (75 mg total) by mouth every other day. 15 tablet 0   SUMAtriptan (IMITREX) 100 MG tablet TAKE 1 TABLET BY MOUTH AT THE 1ST SIGN OF A MIGRAINE. MAY REPEAT IN 2 HOURS, MAX 2 TABLETS PER 24 HOURS (MAY FILL EVERY 25 DAYS) 12 tablet 3   No current facility-administered medications for this visit.    Allergies  Allergen Reactions   Codeine Nausea Only   Vicodin [Hydrocodone-Acetaminophen] Itching  and Rash   Crestor [Rosuvastatin Calcium] Other (See Comments)    Muscle aches    Lipitor [Atorvastatin]     Constipation, fatigue, achy   Pravachol [Pravastatin Sodium] Other (See Comments)    Drowsiness and memory trouble   Singulair [Montelukast Sodium] Other (See Comments)    Headache   Augmentin [Amoxicillin-Pot Clavulanate]     Rash, redness, swelling along the right side of the face   Imipramine Other (See Comments)    Drowsiness   Topamax [Topiramate] Other (See Comments)    Drowsiness, stuttering and memory trouble

## 2023-04-22 ENCOUNTER — Ambulatory Visit (INDEPENDENT_AMBULATORY_CARE_PROVIDER_SITE_OTHER): Payer: BC Managed Care – PPO | Admitting: *Deleted

## 2023-04-22 DIAGNOSIS — J309 Allergic rhinitis, unspecified: Secondary | ICD-10-CM | POA: Diagnosis not present

## 2023-05-05 NOTE — Telephone Encounter (Signed)
Will call pt to schedule once we receive future schedule 

## 2023-05-11 NOTE — Telephone Encounter (Signed)
LMTRC

## 2023-05-20 ENCOUNTER — Ambulatory Visit (INDEPENDENT_AMBULATORY_CARE_PROVIDER_SITE_OTHER): Payer: BC Managed Care – PPO | Admitting: *Deleted

## 2023-05-20 DIAGNOSIS — J309 Allergic rhinitis, unspecified: Secondary | ICD-10-CM

## 2023-05-26 ENCOUNTER — Ambulatory Visit: Payer: BC Managed Care – PPO | Admitting: Family Medicine

## 2023-05-26 VITALS — BP 136/74 | HR 63 | Temp 97.9°F | Wt 157.2 lb

## 2023-05-26 DIAGNOSIS — R270 Ataxia, unspecified: Secondary | ICD-10-CM

## 2023-05-26 DIAGNOSIS — R531 Weakness: Secondary | ICD-10-CM

## 2023-05-26 DIAGNOSIS — R296 Repeated falls: Secondary | ICD-10-CM

## 2023-05-26 NOTE — Progress Notes (Signed)
Subjective:    Patient ID: Joyce Jacobs, female    DOB: 24-Dec-1960, 62 y.o.   MRN: [id number]  Discussed the use of AI scribe software for clinical note transcription with the patient, who gave verbal consent to proceed.  History of Present Illness   The patient, with an unspecified medical history, presented with a recent onset of right-sided weakness and falls. The initial incident occurred during a trip to [place], where the patient experienced a sudden collapse at the airport, followed by a fall on a water bus. The patient denied any associated dizziness, headache, or pain during these episodes. Present over the past few weeks but more progressive.  The patient reported a gradual onset of right foot dragging and a tendency to collide with objects on the right side. The patient also noted a decrease in right hand grip strength. There were no changes in coordination or cognitive function, and the patient was able to continue daily activities, including work and crossword puzzles.  The patient reported a decrease in stamina, particularly towards the end of the workday, but denied any difficulties driving home from work. The patient's appetite remained normal, and there were no reports of numbness, tingling, or tremors. However, the patient did note an increased tendency for the right foot to catch on objects while walking at work.  The patient's headache patterns have improved since the last visit in March, with the most severe headaches being sexually induced. The patient denied any changes in hand coordination or any cognitive difficulties.      Husband and the patient relates that she has been walking into door frames when missing things on the right side of her visual field  Review of Systems     Objective:    Physical Exam   VITALS: Pulses good. MUSCULOSKELETAL: Reduced grip strength in right hand. Weakness in right arm and leg during resistance testing. NEUROLOGICAL: Right side  weakness in arm and leg. Right eye tracking difficulty, ceasing in certain directions. see additional documentation in EPIC lungs clear heart normal     On physical exam there is some ataxia she tends to drag her right foot and she had difficult time stepping up the 1 step onto the exam table with her leg buckling She does waver on the Romberg but does not fall Her finger-to-nose is slow but accurate Her EOMI showed significant issues in regards to lateral gaze and superior gaze and tends to stop with the right eye halfway across There is also weakness on the right side of the face There is weakness in the grip on her right side as well as bicep right side and deltoid there is weakness in the quadriceps hamstring dorsiflexion plantarflexion some anterior and external rotation of the foot on the right side       Assessment & Plan:  Assessment and Plan    Right-sided Weakness Recent onset of right-sided weakness, including right foot dragging and decreased grip strength. No reported numbness, tingling, or tremors. Neurological examination confirmed weakness in the right arm and leg. Differential diagnosis includes stroke, cyst, or growth affecting the nervous system. -Order MRI of the brain this week to investigate the cause of the right-sided weakness. -If MRI of the brain is normal, follow up with an MRI of the cervical spine.  Headaches Improvement in headache frequency and severity since last visit in March. Noted that the most severe headaches are sexually induced. -Continue current headache management plan.  Fatigue Reports increased fatigue, particularly towards the  end of the workday. No reported difficulties with driving. -Advise to continue current activities but to be mindful of fatigue levels and ensure adequate rest.  General Health Maintenance -Continue regular physical activity, with caution advised when navigating steps due to right-sided weakness.      1. Right sided  weakness I am concerned that this weakness is gradual and progressive over the past several weeks.  There could be a underlying neurologic disease going on within the degenerative neurologic fashion-we need to pursue forward with a stat MRI to rule out the possibility of tumor or growth and even the possibility of a stroke.  Given that this is been a progressive issue it is a stat situation.  I did speak with radiology specialist recommending MRI brain with and without in order to characterize the underlying issue better.  She does need a stat MRI we will pursue this for tomorrow or Thursday at the latest May or may not need MRI of the cervical spine May or may not need EMG nerve conduction study May or may not need referral to neurologist or neurosurgeon depending on results of tests - MR Brain W Wo Contrast; Future  2. Ataxia Please see discussion above stat MRI with and without - MR Brain W Wo Contrast; Future  3. Frequent falls Patient was cautioned with her walking to do 1 step at a time and avoid any movements that could cause falls - MR Brain W Wo Contrast; Future We will follow-up closely depending on results of tests   Addendum-husband called Wednesday morning to state that the patient was too weak to's get up off the commode.  Because of this it is absolutely necessary to move forward with MR ASAP.  Hopefully insurance company will not cause any significant roadblock.

## 2023-05-27 ENCOUNTER — Ambulatory Visit (HOSPITAL_COMMUNITY)
Admission: RE | Admit: 2023-05-27 | Discharge: 2023-05-27 | Disposition: A | Discharge status: Home / Self Care | Payer: BC Managed Care – PPO | Source: Ambulatory Visit | Attending: Family Medicine | Admitting: Family Medicine

## 2023-05-27 ENCOUNTER — Ambulatory Visit: Payer: BC Managed Care – PPO | Admitting: Family Medicine

## 2023-05-27 ENCOUNTER — Encounter: Payer: Self-pay | Admitting: Family Medicine

## 2023-05-27 DIAGNOSIS — R270 Ataxia, unspecified: Secondary | ICD-10-CM | POA: Diagnosis present

## 2023-05-27 DIAGNOSIS — R531 Weakness: Secondary | ICD-10-CM

## 2023-05-27 DIAGNOSIS — R296 Repeated falls: Secondary | ICD-10-CM | POA: Insufficient documentation

## 2023-05-27 HISTORY — DX: Weakness: R53.1

## 2023-05-27 MED ORDER — GADOBUTROL 1 MMOL/ML IV SOLN
7.0000 mL | Freq: Once | INTRAVENOUS | Status: AC | PRN
  Administered 2023-05-27: 7 mL via INTRAVENOUS

## 2023-05-27 MED ORDER — DEXAMETHASONE 4 MG PO TABS: 4.0000 mg | ORAL_TABLET | Freq: Three times a day (TID) | ORAL | 6 refills | Status: DC

## 2023-05-27 NOTE — Progress Notes (Unsigned)
Subjective:    Patient ID: Joyce Jacobs, female    DOB: Jan 16, 1961, 62 y.o.   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 Yetta Barre neurosurgery who stated based on MRI and the fact that it does cross into the right side he would not recommend debulking surgery Given the edema he would recommend dexamethasone 3 times daily He also gave me the number for the tumor board for consultation Prognosis grim perhaps 3 to 6 months May benefit from palliative radiation may benefit from hospice  Review of Systems     Objective:   Physical Exam        Assessment & Plan:  20 minutes spent with family discussing this with the patient and her husband Questions were answered Imaging of MRI was reviewed with them Previous CT scan from back in March reviewed appear normal Unfortunately this is a very heartbreaking situation for family We will connect with tumor board to see if they would review her case perhaps even setting up palliative radiation

## 2023-05-28 ENCOUNTER — Encounter: Payer: Self-pay | Admitting: Family Medicine

## 2023-05-28 ENCOUNTER — Telehealth: Payer: Self-pay | Admitting: Internal Medicine

## 2023-05-28 ENCOUNTER — Other Ambulatory Visit: Payer: Self-pay | Admitting: Radiation Therapy

## 2023-05-28 NOTE — Telephone Encounter (Signed)
Scheduled appointments per referral. Patient is aware of the appointment time and date as well as the address. Patient was informed to arrive 10-15 minutes prior with updated insurance information. All questions were answered.

## 2023-05-29 ENCOUNTER — Encounter: Payer: Self-pay | Admitting: Genetic Counselor

## 2023-05-29 NOTE — Telephone Encounter (Signed)
Called to schedule pt for procedure. Pt states that she just found out that she has brain cancer. Has upcoming appointment with specialist on Monday. Will call back once she finds out what her next steps are.

## 2023-06-01 ENCOUNTER — Encounter: Payer: Self-pay | Admitting: Internal Medicine

## 2023-06-01 ENCOUNTER — Inpatient Hospital Stay: Payer: BC Managed Care – PPO | Admitting: Internal Medicine

## 2023-06-01 ENCOUNTER — Inpatient Hospital Stay: Payer: BC Managed Care – PPO | Attending: Family Medicine

## 2023-06-01 ENCOUNTER — Encounter: Payer: Self-pay | Admitting: Family Medicine

## 2023-06-01 VITALS — BP 136/77 | HR 69 | Temp 98.1°F | Resp 14 | Ht 63.75 in | Wt 156.7 lb

## 2023-06-01 DIAGNOSIS — G9389 Other specified disorders of brain: Secondary | ICD-10-CM | POA: Insufficient documentation

## 2023-06-01 DIAGNOSIS — G939 Disorder of brain, unspecified: Secondary | ICD-10-CM | POA: Diagnosis not present

## 2023-06-01 MED ORDER — DEXAMETHASONE 4 MG PO TABS: 4.0000 mg | ORAL_TABLET | Freq: Every day | ORAL | Status: DC

## 2023-06-01 NOTE — Progress Notes (Signed)
[facility] at [facility] [address]  [place], [place] [address] [phone]   New Patient Evaluation  Date of Service: 06/01/23 Patient Name: Joyce Jacobs Patient MRN: [id number] Patient DOB: [birth year] Provider: Henreitta Leber, MD  Identifying Statement:  Joyce Jacobs is a 62 y.o. female with left frontal  mass  who presents for initial consultation and evaluation.    Referring Provider: Babs Sciara, MD [address] [place],  [place] [address]  Oncologic History: Oncology History   No history exists.    Biomarkers:  MGMT Unknown.  IDH 1/2 Unknown.  EGFR Unknown  TERT Unknown   History of Present Illness: The patient's records from the referring physician were obtained and reviewed and the patient interviewed to confirm this HPI.  Joyce Jacobs presents to review recent neurologic symptoms.  She describes a roughly 1 month history of progressive right sided weakness, affecting the arm, leg and face.  This has led to several falls, most notably while she was overseas in [place] with her husband in early September.  Her PCP ordered an MRI of the brain which demonstrated a multifocal enhancing mass within the left frontal lobe.  Patient is left handed and doesn't endorse any language symptoms.  Decadron 4mg  TID for the past week has not improved symptoms meaningfully.  Otherwise denies headaches and seizures.   Medications: Current Outpatient Medications on File Prior to Visit  Medication Sig Dispense Refill   albuterol (VENTOLIN HFA) 108 (90 Base) MCG/ACT inhaler inhale 2 puffs every 4-6 hours as needed for cough, wheeze, tightness in chest, shortness of breath 1 each 1   baclofen (LIORESAL) 10 MG tablet Take 1 tablet (10 mg total) by mouth 3 (three) times daily as needed. for muscle spams 90 tablet 5   dexamethasone (DECADRON) 4 MG tablet Take 1 tablet (4 mg total) by mouth 3 (three) times daily. 90 tablet 6   diclofenac  (VOLTAREN) 75 MG EC tablet Take 1 tablet (75 mg total) by mouth 2 (two) times daily. 30 tablet 0   estradiol (ESTRACE) 0.1 MG/GM vaginal cream Use twice a week as directed prn 42.5 g 5   Evolocumab (REPATHA SURECLICK) 140 MG/ML SOAJ Inject 140 mg into the skin every 14 (fourteen) days. 2 mL 5   ezetimibe (ZETIA) 10 MG tablet Take 1 tablet by mouth once daily 90 tablet 1   famotidine (PEPCID) 40 MG tablet Take 1 tablet (40 mg total) by mouth at bedtime. 90 tablet 3   fexofenadine (ALLEGRA) 180 MG tablet Take 180 mg by mouth daily.     naproxen sodium (ANAPROX) 220 MG tablet Take 440 mg by mouth daily as needed (headache).     Olopatadine HCl (PATADAY) 0.2 % SOLN Insert 1 drop into each eye daily for allergies. 2.5 mL 2   OVER THE COUNTER MEDICATION Take 1 tablet by mouth daily. Doterra Supplements: Microplex MVP     OVER THE COUNTER MEDICATION Take 1 tablet by mouth daily. Doterra Supplements: Zeomega Essential Oils     pimecrolimus (ELIDEL) 1 % cream Apply topically 2 (two) times daily. 100 g 2   Rimegepant Sulfate (NURTEC) 75 MG TBDP Take 1 tablet (75 mg total) by mouth every other day. 15 tablet 0   azelastine (ASTELIN) 0.1 % nasal spray 2 sprays per nostril 1-2 times daily as needed. (Patient not taking: Reported on 06/01/2023) 30 mL 5   dextromethorphan-guaiFENesin (MUCINEX DM) 30-600 MG 12hr tablet Take 2 tablets by mouth  as needed for cough. (Patient not taking: Reported on 06/01/2023)     EPINEPHRINE 0.3 mg/0.3 mL IJ SOAJ injection INJECT 0.3 MG INTO THE MUSCLE ONCE FOR 1 DOSE (Patient not taking: Reported on 06/01/2023) 2 each 0   ondansetron (ZOFRAN) 8 MG tablet Take 1 tablet (8 mg total) by mouth every 8 (eight) hours as needed for nausea or vomiting. (Patient not taking: Reported on 06/01/2023) 20 tablet 1   SUMAtriptan (IMITREX) 100 MG tablet TAKE 1 TABLET BY MOUTH AT THE 1ST SIGN OF A MIGRAINE. MAY REPEAT IN 2 HOURS, MAX 2 TABLETS PER 24 HOURS (MAY FILL EVERY 25 DAYS) (Patient not taking:  Reported on 06/01/2023) 12 tablet 3   No current facility-administered medications on file prior to visit.    Allergies:  Allergies  Allergen Reactions   Codeine Nausea Only   Vicodin [Hydrocodone-Acetaminophen] Itching and Rash   Crestor [Rosuvastatin Calcium] Other (See Comments)    Muscle aches    Lipitor [Atorvastatin]     Constipation, fatigue, achy   Pravachol [Pravastatin Sodium] Other (See Comments)    Drowsiness and memory trouble   Singulair [Montelukast Sodium] Other (See Comments)    Headache   Augmentin [Amoxicillin-Pot Clavulanate]     Rash, redness, swelling along the right side of the face   Imipramine Other (See Comments)    Drowsiness   Topamax [Topiramate] Other (See Comments)    Drowsiness, stuttering and memory trouble   Past Medical History:  Past Medical History:  Diagnosis Date   Allergy    Anemia    last check up HGb was normal   Angio-edema    Asthma    allergy related or with respiratory illiness   Chronic sinusitis    Complication of anesthesia    nausea and vomiting   Eczema    GERD (gastroesophageal reflux disease)    Headache(784.0)    migraines   Hyperlipidemia    Urticaria    Past Surgical History:  Past Surgical History:  Procedure Laterality Date   ADENOIDECTOMY     BREAST BIOPSY Left 02/2022   CESAREAN SECTION     times three   COLONOSCOPY  05/12/2011   Procedure: COLONOSCOPY;  Surgeon: Corbin Ade, MD;  Location: [facility];  Service: Endoscopy;  Laterality: N/A;  8:30 AM   DILATION AND CURETTAGE OF UTERUS     ENDOMETRIAL ABLATION     FACIAL FRACTURE SURGERY  08/26/1987   broken nose and cheek bone   HEAD & NECK WOUND REPAIR / CLOSURE     times 5   ROBOTIC ASSISTED TOTAL HYSTERECTOMY WITH BILATERAL SALPINGO OOPHERECTOMY Bilateral 03/05/2015   Procedure: ROBOTIC ASSISTED TOTAL HYSTERECTOMY WITH BILATERAL SALPINGO OOPHORECTOMY;  Surgeon: Olivia Mackie, MD;  Location: [facility];  Service: Gynecology;  Laterality:  Bilateral;   SINOSCOPY     SKIN GRAFT Right    Eardrum   TONSILLECTOMY AND ADENOIDECTOMY     TYMPANOPLASTY  [id number]   TYMPANOSTOMY TUBE PLACEMENT     Social History:  Social History   Socioeconomic History   Marital status: Married    Spouse name: Not on file   Number of children: Not on file   Years of education: Not on file   Highest education level: Master's degree (e.g., MA, MS, MEng, MEd, MSW, MBA)  Occupational History   Not on file  Tobacco Use   Smoking status: Never   Smokeless tobacco: Never  Vaping Use   Vaping status: Never Used  Substance and Sexual  Activity   Alcohol use: Yes    Alcohol/week: 4.0 standard drinks of alcohol    Types: 2 Glasses of wine, 2 Shots of liquor per week    Comment: social   Drug use: No   Sexual activity: Yes    Birth control/protection: None  Other Topics Concern   Not on file  Social History Narrative   Not on file   Social Determinants of Health   Financial Resource Strain: Low Risk  (04/02/2023)   Overall Financial Resource Strain (CARDIA)    Difficulty of Paying Living Expenses: Not hard at all  Food Insecurity: No Food Insecurity (04/02/2023)   Hunger Vital Sign    Worried About Running Out of Food in the Last Year: Never true    Ran Out of Food in the Last Year: Never true  Transportation Needs: No Transportation Needs (04/02/2023)   PRAPARE - Administrator, Civil Service (Medical): No    Lack of Transportation (Non-Medical): No  Physical Activity: Insufficiently Active (04/02/2023)   Exercise Vital Sign    Days of Exercise per Week: 3 days    Minutes of Exercise per Session: 30 min  Stress: No Stress Concern Present (04/02/2023)   Joyce Jacobs of Occupational Health - Occupational Stress Questionnaire    Feeling of Stress : Only a little  Social Connections: Moderately Isolated (04/02/2023)   Social Connection and Isolation Panel [NHANES]    Frequency of Communication with Friends and Family: Once a week     Frequency of Social Gatherings with Friends and Family: Once a week    Attends Religious Services: More than 4 times per year    Active Member of [employer] or Organizations: No    Attends Engineer, structural: Not on file    Marital Status: Married  Catering manager Violence: Not on file   Family History:  Family History  Problem Relation Age of Onset   Hypertension Mother    Diabetes Mother    Heart attack Father    Hyperlipidemia Father    Ovarian cancer Maternal Grandmother     Review of Systems: Constitutional: Doesn't report fevers, chills or abnormal weight loss Eyes: Doesn't report blurriness of vision Ears, nose, mouth, throat, and face: Doesn't report sore throat Respiratory: Doesn't report cough, dyspnea or wheezes Cardiovascular: Doesn't report palpitation, chest discomfort  Gastrointestinal:  Doesn't report nausea, constipation, diarrhea GU: Doesn't report incontinence Skin: Doesn't report skin rashes Neurological: Per HPI Musculoskeletal: Doesn't report joint pain Behavioral/Psych: Doesn't report anxiety  Physical Exam: Vitals:   06/01/23 1443  BP: 136/77  Pulse: 69  Resp: 14  Temp: 98.1 F (36.7 C)  SpO2: 99%   KPS: 70. General: Alert, cooperative, pleasant, in no acute distress Head: Normal EENT: No conjunctival injection or scleral icterus.  Lungs: Resp effort normal Cardiac: Regular rate Abdomen: Non-distended abdomen Skin: No rashes cyanosis or petechiae. Extremities: No clubbing or edema  Neurologic Exam: Mental Status: Awake, alert, attentive to examiner. Oriented to self and environment. Language is fluent with intact comprehension.  Cranial Nerves: Visual acuity is grossly normal. Visual fields are full. Extra-ocular movements intact. No ptosis. R UMN facial paresis. Motor: Tone and bulk are normal. Power is 4/5 in right leg, 3/5 in right arm. Reflexes are symmetric, no pathologic reflexes present.  Sensory: Intact to light  touch Gait: Hemiparetic   Labs: I have reviewed the data as listed    Component Value Date/Time   NA 139 03/04/2023 0752   K 4.6  03/04/2023 0752   CL 104 03/04/2023 0752   CO2 23 03/04/2023 0752   GLUCOSE 98 03/04/2023 0752   GLUCOSE 95 03/10/2016 0722   BUN 15 03/04/2023 0752   CREATININE 0.73 03/04/2023 0752   CREATININE 0.81 03/10/2016 0722   CALCIUM 9.1 03/04/2023 0752   PROT 6.4 03/04/2023 0752   ALBUMIN 4.4 03/04/2023 0752   AST 15 03/04/2023 0752   ALT 11 03/04/2023 0752   ALKPHOS 64 03/04/2023 0752   BILITOT 0.3 03/04/2023 0752   GFRNONAA 88 02/23/2018 0806   GFRAA 102 02/23/2018 0806   Lab Results  Component Value Date   WBC 6.1 03/04/2023   NEUTROABS 3.3 03/04/2023   HGB 13.8 03/04/2023   HCT 43.0 03/04/2023   MCV 93 03/04/2023   PLT 241 03/04/2023    Imaging: MR Brain W Wo Contrast  Result Date: 05/27/2023 CLINICAL DATA:  Right-sided weakness.  Ataxia.  Frequent falling. EXAM: MRI HEAD WITHOUT AND WITH CONTRAST TECHNIQUE: Multiplanar, multiecho pulse sequences of the brain and surrounding structures were obtained without and with intravenous contrast. CONTRAST:  7mL GADAVIST GADOBUTROL 1 MMOL/ML IV SOLN COMPARISON:  None Available. FINDINGS: Brain: There is an aggressive infiltrating mass lesion with its epicenter in the left hemisphere consistent with glioblastoma multiform. Mass extends extensively through the left frontal lobe with crossing of the corpus callosum into the deep white matter of the right hemisphere. Extensive tumor is present in the insular region and left temporal tip. Tumor extends within the basal ganglia region and upper thalamus, through the left midbrain to the left pons. After contrast administration, there are multiple foci of abnormal enhancement. Index focus adjacent to the lateral body of the left lateral ventricle measures 1.6 x 1.1 x 1.7 cm. There are a few areas of necrosis. There are a few areas of punctate hemosiderin deposition but  no frank hematoma. There is mass effect with left-to-right shift of 8 mm. No hydrocephalus. No extra-axial collection. Vascular: Major vessels at the base of the brain show flow. Skull and upper cervical spine: Negative Sinuses/Orbits: Clear presently. Previous functional endoscopic sinus surgery. Other: None IMPRESSION: Aggressive infiltrating mass lesion with its epicenter in the left hemisphere consistent with glioblastoma multiform. Extensive tumor in the left frontal lobe with crossing of the corpus callosum into the deep white matter of the right hemisphere. Extensive tumor in the insular region and left temporal tip. Tumor extends within the left basal ganglia region and upper thalamus, through the left midbrain to the left pons. Multiple foci of abnormal enhancement. Index focus adjacent to the lateral body of the left lateral ventricle measures 1.6 x 1.1 x 1.7 cm. There are a few areas of necrosis. There is mass effect with left-to-right shift of 8 mm. These results were called by telephone at the time of interpretation on 05/27/2023 at 1:59 pm to provider [facility] office , who verbally acknowledged these results. Electronically Signed   By: Paulina Fusi M.D.   On: 05/27/2023 14:00    Pathology: n/a  Assessment/Plan Brain mass  We appreciate the opportunity to participate in the care of Joyce Jacobs.  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   We discussed her case in CNS tumor board this morning; recommended stereotactic biopsy ASAP.  She is agreeable with this.  We will reach out to Dr. Conchita Paris who has reviewed her case already.  Recommended decreasing decadron to 4mg  daily given minimal or no improvement in  motor function with TID dosing.  Screening for potential clinical trials was performed and discussed using eligibility criteria for active protocols at [facility], loco-regional tertiary  centers, as well as national database available on GroundTransfer.at.    The patient is not a candidate for a research protocol at this time due to  no diagnosis yet .   We spent twenty additional minutes teaching regarding the natural history, biology, and historical experience in the treatment of brain tumors. We then discussed in detail the current recommendations for therapy focusing on the mode of administration, mechanism of action, anticipated toxicities, and quality of life issues associated with this plan. We also provided teaching sheets for the patient to take home as an additional resource.  All questions were answered. The patient knows to call the clinic with any problems, questions or concerns. No barriers to learning were detected.  The total time spent in the encounter was 60 minutes and more than 50% was on counseling and review of test results   Henreitta Leber, MD Medical Director of Neuro-Oncology [facility] [place] 06/01/23 3:39 PM

## 2023-06-03 ENCOUNTER — Other Ambulatory Visit: Payer: Self-pay | Admitting: Neurosurgery

## 2023-06-04 ENCOUNTER — Other Ambulatory Visit: Payer: Self-pay | Admitting: Radiation Therapy

## 2023-06-04 ENCOUNTER — Telehealth: Payer: Self-pay

## 2023-06-04 ENCOUNTER — Other Ambulatory Visit (HOSPITAL_COMMUNITY): Payer: Self-pay | Admitting: Neurosurgery

## 2023-06-04 DIAGNOSIS — C711 Malignant neoplasm of frontal lobe: Secondary | ICD-10-CM

## 2023-06-04 NOTE — Telephone Encounter (Signed)
Pt husband brought by paperwork for the FMLA to be completed and returned placed in Red Folder up front in Dr Lorin Picket bin   [phone] Loraine Leriche

## 2023-06-05 ENCOUNTER — Encounter (HOSPITAL_COMMUNITY): Payer: Self-pay | Admitting: Neurosurgery

## 2023-06-05 ENCOUNTER — Other Ambulatory Visit: Payer: Self-pay

## 2023-06-05 ENCOUNTER — Ambulatory Visit (HOSPITAL_COMMUNITY)
Admission: RE | Admit: 2023-06-05 | Discharge: 2023-06-05 | Disposition: A | Discharge status: Home / Self Care | Payer: BC Managed Care – PPO | Source: Ambulatory Visit | Attending: Neurosurgery | Admitting: Neurosurgery

## 2023-06-05 DIAGNOSIS — C711 Malignant neoplasm of frontal lobe: Secondary | ICD-10-CM | POA: Diagnosis present

## 2023-06-05 MED ORDER — GADOBUTROL 1 MMOL/ML IV SOLN
7.0000 mL | Freq: Once | INTRAVENOUS | Status: AC | PRN
  Administered 2023-06-05: 7 mL via INTRAVENOUS

## 2023-06-05 NOTE — Progress Notes (Signed)
PCP - Dr Lilyan Punt Cardiologist - none  Chest x-ray - n/a EKG - n/a Stress Test - n/a ECHO - n/a Cardiac Cath - n/a  ICD Pacemaker/Loop - n/a  Sleep Study -  n/a CPAP - none  Diabetes - n/a  ERAS: Clear liquids til 6:30 AM DOS.  STOP now taking any Aspirin (unless otherwise instructed by your surgeon), Aleve, Naproxen, Ibuprofen, Motrin, Advil, Goody's, BC's, all herbal medications, fish oil, and all vitamins.   Coronavirus Screening Do you have any of the following symptoms:  Cough yes/no: No Fever (>100.43F)  yes/no: No Runny nose yes/no: No Sore throat yes/no: No Difficulty breathing/shortness of breath  yes/no: No  Have you traveled in the last 14 days and where? yes/no: No  Patient verbalized understanding of instructions that were given via phone.

## 2023-06-08 NOTE — Anesthesia Preprocedure Evaluation (Signed)
Anesthesia Evaluation  Patient identified by MRN, date of birth, ID band Patient awake    Reviewed: Allergy & Precautions, NPO status , Patient's Chart, lab work & pertinent test results  History of Anesthesia Complications (+) PONV and history of anesthetic complications  Airway Mallampati: II  TM Distance: >3 FB Neck ROM: Full    Dental no notable dental hx.    Pulmonary asthma    Pulmonary exam normal        Cardiovascular + CAD   Rhythm:Regular Rate:Normal     Neuro/Psych  Headaches Brain mass  negative psych ROS   GI/Hepatic Neg liver ROS,GERD  ,,  Endo/Other  negative endocrine ROS    Renal/GU negative Renal ROS  negative genitourinary   Musculoskeletal negative musculoskeletal ROS (+)    Abdominal Normal abdominal exam  (+)   Peds  Hematology  (+) Blood dyscrasia, anemia   Anesthesia Other Findings   Reproductive/Obstetrics                             Anesthesia Physical Anesthesia Plan  ASA: 3  Anesthesia Plan: General   Post-op Pain Management: Tylenol PO (pre-op)*   Induction: Intravenous  PONV Risk Score and Plan: 4 or greater and Ondansetron, Dexamethasone, Midazolam, Treatment may vary due to age or medical condition and Amisulpride  Airway Management Planned: Mask and Oral ETT  Additional Equipment: None  Intra-op Plan:   Post-operative Plan: Extubation in OR  Informed Consent: I have reviewed the patients History and Physical, chart, labs and discussed the procedure including the risks, benefits and alternatives for the proposed anesthesia with the patient or authorized representative who has indicated his/her understanding and acceptance.     Dental advisory given  Plan Discussed with: CRNA  Anesthesia Plan Comments:        Anesthesia Quick Evaluation

## 2023-06-08 NOTE — Telephone Encounter (Signed)
This form was filled out Erica-please review over the form if there is anything that needs to be amended please let me know.  Please also make sure a copy of this is put into the patient's chart and also please make sure we notify her husband Loraine Leriche when it is ready if anything else is needed please let me know  (FYI she is having a brain biopsy on Tuesday, 06/09/2023)

## 2023-06-09 ENCOUNTER — Other Ambulatory Visit: Payer: Self-pay

## 2023-06-09 ENCOUNTER — Encounter (HOSPITAL_COMMUNITY)
Admission: RE | Disposition: A | Discharge status: Home / Self Care | Payer: Self-pay | Source: Ambulatory Visit | Attending: Neurosurgery

## 2023-06-09 ENCOUNTER — Inpatient Hospital Stay (HOSPITAL_COMMUNITY): Payer: Self-pay | Admitting: Anesthesiology

## 2023-06-09 ENCOUNTER — Encounter (HOSPITAL_COMMUNITY): Payer: Self-pay | Admitting: Neurosurgery

## 2023-06-09 ENCOUNTER — Inpatient Hospital Stay (HOSPITAL_COMMUNITY)
Admission: RE | Admit: 2023-06-09 | Discharge: 2023-06-10 | DRG: 026 | Disposition: A | Discharge status: Home / Self Care | Payer: BC Managed Care – PPO | Source: Ambulatory Visit | Attending: Neurosurgery | Admitting: Neurosurgery

## 2023-06-09 DIAGNOSIS — G939 Disorder of brain, unspecified: Secondary | ICD-10-CM | POA: Diagnosis not present

## 2023-06-09 DIAGNOSIS — E785 Hyperlipidemia, unspecified: Secondary | ICD-10-CM | POA: Diagnosis present

## 2023-06-09 DIAGNOSIS — Z9889 Other specified postprocedural states: Secondary | ICD-10-CM | POA: Diagnosis not present

## 2023-06-09 DIAGNOSIS — Z8249 Family history of ischemic heart disease and other diseases of the circulatory system: Secondary | ICD-10-CM | POA: Diagnosis not present

## 2023-06-09 DIAGNOSIS — Z881 Allergy status to other antibiotic agents status: Secondary | ICD-10-CM

## 2023-06-09 DIAGNOSIS — Z90722 Acquired absence of ovaries, bilateral: Secondary | ICD-10-CM

## 2023-06-09 DIAGNOSIS — G8191 Hemiplegia, unspecified affecting right dominant side: Secondary | ICD-10-CM | POA: Diagnosis present

## 2023-06-09 DIAGNOSIS — Z90710 Acquired absence of both cervix and uterus: Secondary | ICD-10-CM | POA: Diagnosis not present

## 2023-06-09 DIAGNOSIS — D649 Anemia, unspecified: Secondary | ICD-10-CM | POA: Diagnosis present

## 2023-06-09 DIAGNOSIS — Z8041 Family history of malignant neoplasm of ovary: Secondary | ICD-10-CM | POA: Diagnosis not present

## 2023-06-09 DIAGNOSIS — Z885 Allergy status to narcotic agent status: Secondary | ICD-10-CM

## 2023-06-09 DIAGNOSIS — L309 Dermatitis, unspecified: Secondary | ICD-10-CM | POA: Diagnosis present

## 2023-06-09 DIAGNOSIS — Z98891 History of uterine scar from previous surgery: Secondary | ICD-10-CM | POA: Diagnosis not present

## 2023-06-09 DIAGNOSIS — Z888 Allergy status to other drugs, medicaments and biological substances status: Secondary | ICD-10-CM | POA: Diagnosis not present

## 2023-06-09 DIAGNOSIS — Z8701 Personal history of pneumonia (recurrent): Secondary | ICD-10-CM

## 2023-06-09 DIAGNOSIS — Z9089 Acquired absence of other organs: Secondary | ICD-10-CM | POA: Diagnosis not present

## 2023-06-09 DIAGNOSIS — J45909 Unspecified asthma, uncomplicated: Secondary | ICD-10-CM | POA: Diagnosis present

## 2023-06-09 DIAGNOSIS — D496 Neoplasm of unspecified behavior of brain: Secondary | ICD-10-CM | POA: Diagnosis present

## 2023-06-09 DIAGNOSIS — C719 Malignant neoplasm of brain, unspecified: Principal | ICD-10-CM | POA: Diagnosis present

## 2023-06-09 DIAGNOSIS — Z79899 Other long term (current) drug therapy: Secondary | ICD-10-CM

## 2023-06-09 DIAGNOSIS — Z833 Family history of diabetes mellitus: Secondary | ICD-10-CM | POA: Diagnosis not present

## 2023-06-09 DIAGNOSIS — R29810 Facial weakness: Secondary | ICD-10-CM | POA: Diagnosis present

## 2023-06-09 DIAGNOSIS — K219 Gastro-esophageal reflux disease without esophagitis: Secondary | ICD-10-CM | POA: Diagnosis present

## 2023-06-09 DIAGNOSIS — G43909 Migraine, unspecified, not intractable, without status migrainosus: Secondary | ICD-10-CM | POA: Diagnosis present

## 2023-06-09 HISTORY — PX: APPLICATION OF CRANIAL NAVIGATION: SHX6578

## 2023-06-09 HISTORY — PX: PR DURAL GRAFT SPINAL: 63710

## 2023-06-09 HISTORY — DX: Pneumonia, unspecified organism: J18.9

## 2023-06-09 HISTORY — DX: Other specified postprocedural states: Z98.890

## 2023-06-09 HISTORY — DX: Other specified postprocedural states: R11.2

## 2023-06-09 LAB — MRSA NEXT GEN BY PCR, NASAL: MRSA by PCR Next Gen: NOT DETECTED

## 2023-06-09 LAB — BASIC METABOLIC PANEL
Anion gap: 11 (ref 5–15)
BUN: 13 mg/dL (ref 8–23)
CO2: 24 mmol/L (ref 22–32)
Calcium: 9.3 mg/dL (ref 8.9–10.3)
Chloride: 103 mmol/L (ref 98–111)
Creatinine, Ser: 0.69 mg/dL (ref 0.44–1.00)
GFR, Estimated: >60 mL/min (ref >60)
Glucose, Bld: 101 mg/dL — ABNORMAL HIGH (ref 70–99)
Potassium: 4.3 mmol/L (ref 3.5–5.1)
Sodium: 138 mmol/L (ref 135–145)

## 2023-06-09 LAB — CBC
HCT: 46.2 % — ABNORMAL HIGH (ref 36.0–46.0)
Hemoglobin: 14.8 g/dL (ref 12.0–15.0)
MCH: 29.4 pg (ref 26.0–34.0)
MCHC: 32 g/dL (ref 30.0–36.0)
MCV: 91.8 fL (ref 80.0–100.0)
Platelets: 207 10*3/uL (ref 150–400)
RBC: 5.03 MIL/uL (ref 3.87–5.11)
RDW: 13.3 % (ref 11.5–15.5)
WBC: 16.8 10*3/uL — ABNORMAL HIGH (ref 4.0–10.5)
nRBC: 0 % (ref 0.0–0.2)

## 2023-06-09 SURGERY — FRAMELESS STEREOTACTIC BIOPSY
Anesthesia: General | Site: Head | Laterality: Left

## 2023-06-09 MED ORDER — PHENYLEPHRINE 80 MCG/ML (10ML) SYRINGE FOR IV PUSH (FOR BLOOD PRESSURE SUPPORT)
PREFILLED_SYRINGE | INTRAVENOUS | Status: DC | PRN
  Administered 2023-06-09 (×2): 160 ug via INTRAVENOUS

## 2023-06-09 MED ORDER — VANCOMYCIN HCL IN DEXTROSE 1-5 GM/200ML-% IV SOLN
1000.0000 mg | INTRAVENOUS | Status: AC
  Administered 2023-06-09: 1000 mg via INTRAVENOUS
  Filled 2023-06-09: qty 200

## 2023-06-09 MED ORDER — ONDANSETRON HCL 4 MG PO TABS: 4.0000 mg | ORAL_TABLET | ORAL | Status: DC | PRN

## 2023-06-09 MED ORDER — BUPIVACAINE HCL (PF) 0.5 % IJ SOLN
INTRAMUSCULAR | Status: DC | PRN
  Administered 2023-06-09: 1 mL

## 2023-06-09 MED ORDER — SUMATRIPTAN SUCCINATE 25 MG PO TABS: 25.0000 mg | ORAL_TABLET | ORAL | Status: DC | PRN

## 2023-06-09 MED ORDER — EZETIMIBE 10 MG PO TABS
10.0000 mg | ORAL_TABLET | Freq: Every day | ORAL | Status: DC
  Administered 2023-06-09 – 2023-06-10 (×2): 10 mg via ORAL
  Filled 2023-06-09 (×2): qty 1

## 2023-06-09 MED ORDER — LIDOCAINE-EPINEPHRINE 1 %-1:100000 IJ SOLN
INTRAMUSCULAR | Status: DC | PRN
  Administered 2023-06-09: 1 mL

## 2023-06-09 MED ORDER — THROMBIN 5000 UNITS EX SOLR
CUTANEOUS | Status: AC
  Filled 2023-06-09: qty 5000

## 2023-06-09 MED ORDER — LIDOCAINE 2% (20 MG/ML) 5 ML SYRINGE
INTRAMUSCULAR | Status: DC | PRN
  Administered 2023-06-09: 40 mg via INTRAVENOUS

## 2023-06-09 MED ORDER — ALBUTEROL SULFATE (2.5 MG/3ML) 0.083% IN NEBU: 2.5000 mg | INHALATION_SOLUTION | Freq: Four times a day (QID) | RESPIRATORY_TRACT | Status: DC | PRN

## 2023-06-09 MED ORDER — ONDANSETRON HCL 4 MG/2ML IJ SOLN
INTRAMUSCULAR | Status: DC | PRN
  Administered 2023-06-09: 4 mg via INTRAVENOUS

## 2023-06-09 MED ORDER — DEXAMETHASONE SODIUM PHOSPHATE 10 MG/ML IJ SOLN
INTRAMUSCULAR | Status: DC | PRN
  Administered 2023-06-09: 10 mg via INTRAVENOUS

## 2023-06-09 MED ORDER — PHENYLEPHRINE 80 MCG/ML (10ML) SYRINGE FOR IV PUSH (FOR BLOOD PRESSURE SUPPORT)
PREFILLED_SYRINGE | INTRAVENOUS | Status: AC
  Filled 2023-06-09: qty 10

## 2023-06-09 MED ORDER — OXYCODONE-ACETAMINOPHEN 5-325 MG PO TABS
1.0000 | ORAL_TABLET | ORAL | Status: DC | PRN
  Administered 2023-06-10: 1 via ORAL
  Filled 2023-06-09: qty 1

## 2023-06-09 MED ORDER — BACITRACIN ZINC 500 UNIT/GM EX OINT
TOPICAL_OINTMENT | CUTANEOUS | Status: DC | PRN
  Administered 2023-06-09: 1 via TOPICAL

## 2023-06-09 MED ORDER — SUGAMMADEX SODIUM 200 MG/2ML IV SOLN
INTRAVENOUS | Status: DC | PRN
  Administered 2023-06-09: 200 mg via INTRAVENOUS

## 2023-06-09 MED ORDER — ONDANSETRON HCL 4 MG/2ML IJ SOLN
INTRAMUSCULAR | Status: AC
  Filled 2023-06-09: qty 2

## 2023-06-09 MED ORDER — CHLORHEXIDINE GLUCONATE CLOTH 2 % EX PADS: 6.0000 | MEDICATED_PAD | Freq: Once | CUTANEOUS | Status: DC

## 2023-06-09 MED ORDER — LIDOCAINE-EPINEPHRINE 1 %-1:100000 IJ SOLN
INTRAMUSCULAR | Status: AC
  Filled 2023-06-09: qty 1

## 2023-06-09 MED ORDER — ORAL CARE MOUTH RINSE: 15.0000 mL | Freq: Once | OROMUCOSAL | Status: DC

## 2023-06-09 MED ORDER — 0.9 % SODIUM CHLORIDE (POUR BTL) OPTIME
TOPICAL | Status: DC | PRN
  Administered 2023-06-09: 1000 mL

## 2023-06-09 MED ORDER — FAMOTIDINE 20 MG PO TABS
40.0000 mg | ORAL_TABLET | Freq: Every day | ORAL | Status: DC
  Administered 2023-06-09: 40 mg via ORAL
  Filled 2023-06-09: qty 2

## 2023-06-09 MED ORDER — DEXAMETHASONE 4 MG PO TABS
4.0000 mg | ORAL_TABLET | Freq: Every day | ORAL | Status: DC
  Administered 2023-06-10: 4 mg via ORAL
  Filled 2023-06-09: qty 1

## 2023-06-09 MED ORDER — LABETALOL HCL 5 MG/ML IV SOLN
10.0000 mg | INTRAVENOUS | Status: DC | PRN
  Filled 2023-06-09: qty 8

## 2023-06-09 MED ORDER — ROCURONIUM BROMIDE 10 MG/ML (PF) SYRINGE
PREFILLED_SYRINGE | INTRAVENOUS | Status: AC
  Filled 2023-06-09: qty 10

## 2023-06-09 MED ORDER — LIDOCAINE 2% (20 MG/ML) 5 ML SYRINGE
INTRAMUSCULAR | Status: AC
  Filled 2023-06-09: qty 5

## 2023-06-09 MED ORDER — CHLORHEXIDINE GLUCONATE 0.12 % MT SOLN
OROMUCOSAL | Status: AC
  Administered 2023-06-09: 15 mL
  Filled 2023-06-09: qty 15

## 2023-06-09 MED ORDER — PROPOFOL 10 MG/ML IV BOLUS
INTRAVENOUS | Status: DC | PRN
  Administered 2023-06-09: 140 mg via INTRAVENOUS
  Administered 2023-06-09: 40 mg via INTRAVENOUS

## 2023-06-09 MED ORDER — BUPIVACAINE HCL (PF) 0.5 % IJ SOLN
INTRAMUSCULAR | Status: AC
  Filled 2023-06-09: qty 30

## 2023-06-09 MED ORDER — PROPOFOL 10 MG/ML IV BOLUS
INTRAVENOUS | Status: AC
  Filled 2023-06-09: qty 20

## 2023-06-09 MED ORDER — EPHEDRINE SULFATE-NACL 50-0.9 MG/10ML-% IV SOSY
PREFILLED_SYRINGE | INTRAVENOUS | Status: DC | PRN
  Administered 2023-06-09: 15 mg via INTRAVENOUS

## 2023-06-09 MED ORDER — FENTANYL CITRATE (PF) 250 MCG/5ML IJ SOLN
INTRAMUSCULAR | Status: DC | PRN
  Administered 2023-06-09 (×2): 50 ug via INTRAVENOUS

## 2023-06-09 MED ORDER — MIDAZOLAM HCL 2 MG/2ML IJ SOLN
INTRAMUSCULAR | Status: DC | PRN
  Administered 2023-06-09 (×2): 1 mg via INTRAVENOUS

## 2023-06-09 MED ORDER — BACLOFEN 10 MG PO TABS: 10.0000 mg | ORAL_TABLET | Freq: Three times a day (TID) | ORAL | Status: DC | PRN

## 2023-06-09 MED ORDER — ONDANSETRON HCL 4 MG/2ML IJ SOLN: 4.0000 mg | INTRAMUSCULAR | Status: DC | PRN

## 2023-06-09 MED ORDER — CHLORHEXIDINE GLUCONATE 0.12 % MT SOLN: 15.0000 mL | Freq: Once | OROMUCOSAL | Status: DC

## 2023-06-09 MED ORDER — CHLORHEXIDINE GLUCONATE CLOTH 2 % EX PADS
6.0000 | MEDICATED_PAD | Freq: Every day | CUTANEOUS | Status: DC
  Administered 2023-06-10 (×2): 6 via TOPICAL

## 2023-06-09 MED ORDER — DEXAMETHASONE SODIUM PHOSPHATE 10 MG/ML IJ SOLN
INTRAMUSCULAR | Status: AC
  Filled 2023-06-09: qty 1

## 2023-06-09 MED ORDER — ROCURONIUM BROMIDE 10 MG/ML (PF) SYRINGE
PREFILLED_SYRINGE | INTRAVENOUS | Status: DC | PRN
  Administered 2023-06-09: 50 mg via INTRAVENOUS
  Administered 2023-06-09: 20 mg via INTRAVENOUS

## 2023-06-09 MED ORDER — FENTANYL CITRATE (PF) 250 MCG/5ML IJ SOLN
INTRAMUSCULAR | Status: AC
  Filled 2023-06-09: qty 5

## 2023-06-09 MED ORDER — MIDAZOLAM HCL 2 MG/2ML IJ SOLN
INTRAMUSCULAR | Status: AC
  Filled 2023-06-09: qty 2

## 2023-06-09 MED ORDER — LORATADINE 10 MG PO TABS: 10.0000 mg | ORAL_TABLET | Freq: Every day | ORAL | Status: DC | PRN

## 2023-06-09 MED ORDER — BACITRACIN ZINC 500 UNIT/GM EX OINT
TOPICAL_OINTMENT | CUTANEOUS | Status: AC
  Filled 2023-06-09: qty 28.35

## 2023-06-09 MED ORDER — EPHEDRINE 5 MG/ML INJ
INTRAVENOUS | Status: AC
  Filled 2023-06-09: qty 5

## 2023-06-09 MED ORDER — PROMETHAZINE HCL 25 MG PO TABS: 12.5000 mg | ORAL_TABLET | ORAL | Status: DC | PRN

## 2023-06-09 MED ORDER — GUAIFENESIN ER 600 MG PO TB12: 600.0000 mg | ORAL_TABLET | Freq: Two times a day (BID) | ORAL | Status: DC | PRN

## 2023-06-09 MED ORDER — THROMBIN 5000 UNITS EX SOLR
OROMUCOSAL | Status: DC | PRN
  Administered 2023-06-09: 5 mL via TOPICAL

## 2023-06-09 MED ORDER — SODIUM CHLORIDE 0.9 % IV SOLN: INTRAVENOUS | Status: DC

## 2023-06-09 SURGICAL SUPPLY — 59 items
BAG COUNTER SPONGE SURGICOUNT (BAG) ×1 IMPLANT
BAG SPNG CNTER NS LX DISP (BAG) ×1
BATTERY IQ STERILE (MISCELLANEOUS) ×1 IMPLANT
BIT DRILL OS SMALL 2.0 LONG (BIT) IMPLANT
BLADE CLIPPER SURG (BLADE) IMPLANT
BNDG ADH 1X3 SHEER STRL LF (GAUZE/BANDAGES/DRESSINGS) IMPLANT
BNDG ADH THN 3X1 STRL LF (GAUZE/BANDAGES/DRESSINGS)
BTRY SRG DRVR 1.5 IQ (MISCELLANEOUS) ×1
BUR 14 MATCH 3 (BUR) IMPLANT
BUR MATCHSTICK NEURO 3.0 LAGG (BURR) IMPLANT
BURR 14 MATCH 3 (BUR)
CANISTER SUCT 3000ML PPV (MISCELLANEOUS) ×1 IMPLANT
CNTNR URN SCR LID CUP LEK RST (MISCELLANEOUS) ×3 IMPLANT
CONT SPEC 4OZ STRL OR WHT (MISCELLANEOUS) ×3
COVERAGE SUPPORT O-ARM STEALTH (MISCELLANEOUS) ×1 IMPLANT
DRAPE NEUROLOGICAL W/INCISE (DRAPES) ×1 IMPLANT
DRILL OS SMALL 2.0 LONG (BIT) ×1
DRSG TELFA 3X8 NADH STRL (GAUZE/BANDAGES/DRESSINGS) ×1 IMPLANT
DURAPREP 26ML APPLICATOR (WOUND CARE) IMPLANT
ELECT REM PT RETURN 9FT ADLT (ELECTROSURGICAL) ×1
ELECTRODE REM PT RTRN 9FT ADLT (ELECTROSURGICAL) ×1 IMPLANT
FEE COVERAGE SUPPORT O-ARM (MISCELLANEOUS) ×1 IMPLANT
FORCEPS BIPOLAR SPETZLER 8 1.0 (NEUROSURGERY SUPPLIES) ×1 IMPLANT
GAUZE 4X4 16PLY ~~LOC~~+RFID DBL (SPONGE) IMPLANT
GLOVE BIOGEL PI IND STRL 7.0 (GLOVE) IMPLANT
GLOVE BIOGEL PI IND STRL 7.5 (GLOVE) ×2 IMPLANT
GLOVE ECLIPSE 7.0 STRL STRAW (GLOVE) ×1 IMPLANT
GLOVE EXAM NITRILE XL STR (GLOVE) IMPLANT
GOWN STRL REUS W/ TWL LRG LVL3 (GOWN DISPOSABLE) IMPLANT
GOWN STRL REUS W/ TWL XL LVL3 (GOWN DISPOSABLE) IMPLANT
GOWN STRL REUS W/TWL 2XL LVL3 (GOWN DISPOSABLE) IMPLANT
GOWN STRL REUS W/TWL LRG LVL3 (GOWN DISPOSABLE)
GOWN STRL REUS W/TWL XL LVL3 (GOWN DISPOSABLE) ×4
HEMOSTAT POWDER SURGIFOAM 1G (HEMOSTASIS) IMPLANT
KIT BASIN OR (CUSTOM PROCEDURE TRAY) ×1 IMPLANT
KIT GUIDE INT PASS TRAJECTORY (MISCELLANEOUS) IMPLANT
KIT NDL BIOPSY PASSIVE (NEEDLE) IMPLANT
KIT NEEDLE BIOPSY PASSIVE (NEEDLE) ×1 IMPLANT
KIT TRAJECTORY GUIDE EXTERNAL (MISCELLANEOUS) IMPLANT
KIT TURNOVER KIT B (KITS) ×1 IMPLANT
MARKER SPHERE PSV REFLC NDI (MISCELLANEOUS) ×3 IMPLANT
NDL HYPO 22X1.5 SAFETY MO (MISCELLANEOUS) IMPLANT
NDL HYPO 25X1 1.5 SAFETY (NEEDLE) ×1 IMPLANT
NEEDLE HYPO 22X1.5 SAFETY MO (MISCELLANEOUS) ×1 IMPLANT
NEEDLE HYPO 25X1 1.5 SAFETY (NEEDLE) ×1 IMPLANT
NS IRRIG 1000ML POUR BTL (IV SOLUTION) ×1 IMPLANT
PACK CRANIOTOMY CUSTOM (CUSTOM PROCEDURE TRAY) ×1 IMPLANT
PAD ARMBOARD 7.5X6 YLW CONV (MISCELLANEOUS) ×3 IMPLANT
PERFORATOR LRG 14-11MM (BIT) ×1 IMPLANT
PIN MAYFIELD SKULL DISP (PIN) ×1 IMPLANT
SPONGE SURGIFOAM ABS GEL SZ50 (HEMOSTASIS) IMPLANT
STAPLER SKIN PROX WIDE 3.9 (STAPLE) ×1 IMPLANT
SUT VIC AB 3-0 SH 8-18 (SUTURE) ×1 IMPLANT
SYR 5ML LL (SYRINGE) ×2 IMPLANT
SYR CONTROL 10ML LL (SYRINGE) IMPLANT
TIP NONSTICK .5X23 (INSTRUMENTS) IMPLANT
TOWEL GREEN STERILE (TOWEL DISPOSABLE) ×1 IMPLANT
TOWEL GREEN STERILE FF (TOWEL DISPOSABLE) ×1 IMPLANT
WATER STERILE IRR 1000ML POUR (IV SOLUTION) ×1 IMPLANT

## 2023-06-09 NOTE — Transfer of Care (Signed)
Immediate Anesthesia Transfer of Care Note  Patient: Joyce Jacobs  Procedure(s) Performed: Left Stereotactic BRAIN BIOPSY (Left: Head) APPLICATION OF CRANIAL NAVIGATION (Left: Head)  Patient Location: PACU  Anesthesia Type:General  Level of Consciousness: drowsy  Airway & Oxygen Therapy: Patient Spontanous Breathing and Patient connected to nasal cannula oxygen  Post-op Assessment: Report given to RN and Post -op Vital signs reviewed and stable  Post vital signs: Reviewed and stable  Last Vitals:  Vitals Value Taken Time  BP    Temp    Pulse    Resp    SpO2      Last Pain:  Vitals:   06/09/23 0754  PainSc: 0-No pain         Complications: No notable events documented.

## 2023-06-09 NOTE — Consult Note (Signed)
NAME:  Joyce Jacobs, MRN:  [id number], DOB:  [birth year], LOS: 0 ADMISSION DATE:  06/09/2023, CONSULTATION DATE:  10/15 REFERRING MD:  Dr. Conchita Paris, CHIEF COMPLAINT:  left frontal tumor s/p crani   History of Present Illness:  Patient is a 62 yo F w/ pertinent PMH asthma, HLD presents to [facility] on 10/15 for elective craniotomy for left frontal tumor.  Patient having rapid onset right sided weakness w/ some right sided facial droop. 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 NSG consulted. Came to [facility] on 10/15 for elective left frontal tumor biopsy. Post op taken to neuro ICU. PCCM consulted for medical management.   Pertinent  Medical History   Past Medical History:  Diagnosis Date   Allergy    Anemia    last check up HGb was normal   Angio-edema    Asthma    allergy related or with respiratory illiness   Chronic sinusitis    Eczema    GERD (gastroesophageal reflux disease)    Headache(784.0)    migraines   Hyperlipidemia    Pneumonia    x 1   PONV (postoperative nausea and vomiting)    Right sided weakness 05/27/2023   Urticaria      Significant Hospital Events: Including procedures, antibiotic start and stop dates in addition to other pertinent events   10/15 s/p crani for left frontal tumor; pccm consulted  Interim History / Subjective:  Aox3; right sided weakness  Objective   Blood pressure 115/61, pulse 74, temperature 97.8 F (36.6 C), resp. rate 18, height 5' 3.75" (1.619 m), weight 70.3 kg, last menstrual period 03/05/2015, SpO2 96%.        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 Plan: -f/u biopsy results -neuro checks -bp goal per nsg -cont decadron  Asthma Plan: -alb prn  HLD  Plan: -cont zetia  Best Practice (right click and "Reselect all SmartList Selections" daily)   Diet/type: Regular consistency (see orders) DVT prophylaxis: SCD GI prophylaxis: H2B Lines: N/A Foley:  N/A Code Status:  full code Last date of multidisciplinary goals of care discussion [per primary]  Labs   CBC: Recent Labs  Lab 06/09/23 0819  WBC 16.8*  HGB 14.8  HCT 46.2*  MCV 91.8  PLT 207    Basic Metabolic Panel: Recent Labs  Lab 06/09/23 0819  NA 138  K 4.3  CL 103  CO2 24  GLUCOSE 101*  BUN 13  CREATININE 0.69  CALCIUM 9.3   GFR: Estimated Creatinine Clearance: 68.9 mL/min (by C-G formula based on SCr of 0.69 mg/dL). Recent Labs  Lab 06/09/23 0819  WBC 16.8*    Liver Function Tests: No results for input(s): "AST", "ALT", "ALKPHOS", "BILITOT", "PROT", "ALBUMIN" in the last 168 hours. No results for input(s): "LIPASE", "AMYLASE" in the last 168 hours. No results for input(s): "AMMONIA" in the last 168 hours.  ABG No results found for: "PHART", "PCO2ART", "PO2ART", "HCO3", "TCO2", "ACIDBASEDEF", "O2SAT"   Coagulation Profile: No results for input(s): "INR", "PROTIME" in the last 168 hours.  Cardiac Enzymes: No results for input(s): "CKTOTAL", "CKMB", "CKMBINDEX", "TROPONINI" in the last 168 hours.  HbA1C: No results found for: "HGBA1C"  CBG: No results for input(s): "GLUCAP" in the last 168 hours.  Review of Systems:   Review of Systems  Constitutional:  Negative for fever.  Respiratory:  Negative for shortness of breath.   Cardiovascular:  Negative for chest pain.  Gastrointestinal:  Negative for abdominal pain, diarrhea, nausea and  vomiting.  Neurological:  Positive for focal weakness. Negative for seizures.     Past Medical History:  She,  has a past medical history of Allergy, Anemia, Angio-edema, Asthma, Chronic sinusitis, Eczema, GERD (gastroesophageal reflux disease), Headache(784.0), Hyperlipidemia, Pneumonia, PONV (postoperative nausea and vomiting), Right sided weakness (05/27/2023), and Urticaria.   Surgical History:   Past Surgical History:  Procedure Laterality Date   ADENOIDECTOMY     BREAST BIOPSY Left 02/2022   CESAREAN SECTION     times three   COLONOSCOPY  05/12/2011   Procedure: COLONOSCOPY;  Surgeon: Corbin Ade, MD;  Location: [facility];  Service: Endoscopy;  Laterality: N/A;  8:30 AM   DILATION AND CURETTAGE OF UTERUS     ENDOMETRIAL ABLATION     FACIAL FRACTURE SURGERY  08/26/1987   broken nose and cheek bone   HEAD & NECK WOUND REPAIR / CLOSURE     times 5   ROBOTIC ASSISTED TOTAL HYSTERECTOMY WITH BILATERAL SALPINGO OOPHERECTOMY Bilateral 03/05/2015   Procedure: ROBOTIC ASSISTED TOTAL HYSTERECTOMY WITH BILATERAL SALPINGO OOPHORECTOMY;  Surgeon: Olivia Mackie, MD;  Location: [facility];  Service: Gynecology;  Laterality: Bilateral;   SINOSCOPY     SKIN GRAFT Right    Eardrum   TONSILLECTOMY AND ADENOIDECTOMY     TYMPANOPLASTY  9528,4132   TYMPANOSTOMY TUBE PLACEMENT       Social History:   reports that she has never smoked. She has never used smokeless tobacco. She reports current alcohol use of about 3.0 standard drinks of alcohol per week. She reports that she does not use drugs.   Family History:  Her family history includes Diabetes in her mother; Heart attack in her father; Hyperlipidemia in her father; Hypertension in her mother; Ovarian cancer in her maternal grandmother.   Allergies Allergies  Allergen Reactions   Codeine Nausea Only   Vicodin [Hydrocodone-Acetaminophen] Itching and Rash   Crestor [Rosuvastatin Calcium] Other (See Comments)    Muscle aches     Lipitor [Atorvastatin]     Constipation, fatigue, achy   Pravachol [Pravastatin Sodium] Other (See Comments)    Drowsiness and memory trouble   Singulair [Montelukast Sodium] Other (See Comments)    Headache   Augmentin [Amoxicillin-Pot Clavulanate]     Rash, redness, swelling along the right side of the face   Imipramine Other (See Comments)    Drowsiness   Topamax [Topiramate] Other (See Comments)    Drowsiness, stuttering and memory trouble     Home Medications  Prior to Admission medications   Medication Sig Start Date End Date Taking? Authorizing Provider  dexamethasone (DECADRON) 4 MG tablet Take 1 tablet (4 mg total) by mouth daily. 06/01/23  Yes Vaslow, Georgeanna Lea, MD  EPINEPHRINE 0.3 mg/0.3 mL IJ SOAJ injection INJECT 0.3 MG INTO THE MUSCLE ONCE FOR 1 DOSE 02/12/22  Yes Alfonse Spruce, MD  Evolocumab [facility] SURECLICK) 140 MG/ML Ivory Broad  Inject 140 mg into the skin every 14 (fourteen) days. 03/03/23  Yes Campbell Riches, NP  ezetimibe (ZETIA) 10 MG tablet Take 1 tablet by mouth once daily Patient taking differently: Take 10 mg by mouth daily. 01/21/23  Yes Babs Sciara, MD  famotidine (PEPCID) 40 MG tablet Take 1 tablet (40 mg total) by mouth at bedtime. 08/29/22  Yes Babs Sciara, MD  albuterol (VENTOLIN HFA) 108 (90 Base) MCG/ACT inhaler inhale 2 puffs every 4-6 hours as needed for cough, wheeze, tightness in chest, shortness of breath 04/03/21   Nehemiah Settle, FNP  azelastine (ASTELIN) 0.1 % nasal spray 2 sprays per nostril 1-2 times daily as needed. Patient not taking: Reported on 06/01/2023 10/29/22   Alfonse Spruce, MD  baclofen (LIORESAL) 10 MG tablet Take 1 tablet (10 mg total) by mouth 3 (three) times daily as needed. for muscle spams 08/29/22   Babs Sciara, MD  estradiol (ESTRACE) 0.1 MG/GM vaginal cream Use twice a week as directed prn 03/03/23   Campbell Riches, NP  fexofenadine (ALLEGRA) 180 MG tablet Take 180 mg by mouth daily as needed for allergies.     [provider]  guaiFENesin (MUCINEX) 600 MG 12 hr tablet Take 600 mg by mouth 2 (two) times daily.    [provider]  naproxen sodium (ANAPROX) 220 MG tablet Take 440 mg by mouth daily as needed (headache).    [provider]  Rimegepant Sulfate (NURTEC) 75 MG TBDP Take 1 tablet (75 mg total) by mouth every other day. Patient not taking: Reported on 06/08/2023 11/04/22   Tommie Sams, DO  SUMAtriptan (IMITREX) 100 MG tablet TAKE 1 TABLET BY MOUTH AT THE 1ST SIGN OF A MIGRAINE. MAY REPEAT IN 2 HOURS, MAX 2 TABLETS PER 24 HOURS (MAY FILL EVERY 25 DAYS) 08/29/22   Babs Sciara, MD     Critical care time: 45 minutes    JD Anselm [facility] 06/09/2023, 5:41 PM  Please see [email or web address] for pager details.  From 7A-7P if no response, please call [phone]. After hours, please call ELink [phone].

## 2023-06-09 NOTE — H&P (Signed)
Chief Complaint   Brain Tumor  History of Present Illness  Joyce Jacobs is a 62 year old woman seen for initial consultation at the request of Dr. Barbaraann Cao. Her case was recently reviewed at the multidisciplinary Neuro-Oncology conference. To review, the patient was at her usual state of health, on an international trip to [place] about a month ago. On her return home while she was at the airport she apparently passed out. After about 30 minutes of rest, she appeared to return back to normal. Once they got back home, she and her husband noted right-sided arm and leg weakness. Upon further questioning, she states that her son actually did notice some mild right-sided facial droop while they were in [place] also. She does not report any difficulty with speech. No left-sided symptoms. 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 Patient was therefore referred for neurosurgical consultation for biopsy to obtain tissue diagnosis and more advanced genetic testing which may guide targeted treatment.  Of note, the patient denies any significant medical history including hypertension, diabetes, heart disease, or stroke. No known lung, liver, kidney disease. She does not report any cancer history. She is not on any blood thinners or antiplatelet agents. She is a nonsmoker   Past Medical History   Past Medical History:  Diagnosis Date   Allergy    Anemia    last check up HGb was normal   Angio-edema    Asthma    allergy related or with respiratory illiness   Chronic sinusitis    Eczema    GERD (gastroesophageal reflux disease)    Headache(784.0)    migraines   Hyperlipidemia    Pneumonia    x 1   PONV (postoperative nausea and vomiting)    Right sided weakness 05/27/2023   Urticaria     Past Surgical History   Past Surgical History:  Procedure Laterality Date   ADENOIDECTOMY     BREAST BIOPSY  Left 02/2022   CESAREAN SECTION     times three   COLONOSCOPY  05/12/2011   Procedure: COLONOSCOPY;  Surgeon: Corbin Ade, MD;  Location: [facility];  Service: Endoscopy;  Laterality: N/A;  8:30 AM   DILATION AND CURETTAGE OF UTERUS     ENDOMETRIAL ABLATION     FACIAL FRACTURE SURGERY  08/26/1987   broken nose and cheek bone   HEAD & NECK WOUND REPAIR / CLOSURE     times 5   ROBOTIC ASSISTED TOTAL HYSTERECTOMY WITH BILATERAL SALPINGO OOPHERECTOMY Bilateral 03/05/2015   Procedure: ROBOTIC ASSISTED TOTAL HYSTERECTOMY WITH BILATERAL SALPINGO OOPHORECTOMY;  Surgeon: Olivia Mackie, MD;  Location: [facility];  Service: Gynecology;  Laterality: Bilateral;   SINOSCOPY     SKIN GRAFT Right    Eardrum   TONSILLECTOMY AND ADENOIDECTOMY     TYMPANOPLASTY  0981,1914   TYMPANOSTOMY TUBE PLACEMENT      Social History   Social History   Tobacco Use   Smoking status: Never   Smokeless tobacco: Never  Vaping Use   Vaping status: Never Used  Substance Use Topics   Alcohol use: Yes    Alcohol/week: 3.0 standard drinks of alcohol    Types: 3 Standard drinks or equivalent per week   Drug use: No    Medications   Prior to Admission medications   Medication Sig Start Date End Date Taking? Authorizing Provider  dexamethasone (DECADRON) 4 MG tablet Take 1 tablet (4  mg total) by mouth daily. 06/01/23  Yes Vaslow, Georgeanna Lea, MD  EPINEPHRINE 0.3 mg/0.3 mL IJ SOAJ injection INJECT 0.3 MG INTO THE MUSCLE ONCE FOR 1 DOSE 02/12/22  Yes Alfonse Spruce, MD  Evolocumab (REPATHA SURECLICK) 140 MG/ML SOAJ Inject 140 mg into the skin every 14 (fourteen) days. 03/03/23  Yes Campbell Riches, NP  ezetimibe (ZETIA) 10 MG tablet Take 1 tablet by mouth once daily Patient taking differently: Take 10 mg by mouth daily. 01/21/23  Yes Babs Sciara, MD  famotidine (PEPCID) 40 MG tablet Take 1 tablet (40 mg total) by mouth at bedtime. 08/29/22  Yes Babs Sciara, MD  albuterol (VENTOLIN HFA) 108 (90 Base)  MCG/ACT inhaler inhale 2 puffs every 4-6 hours as needed for cough, wheeze, tightness in chest, shortness of breath 04/03/21   Nehemiah Settle, FNP  azelastine (ASTELIN) 0.1 % nasal spray 2 sprays per nostril 1-2 times daily as needed. Patient not taking: Reported on 06/01/2023 10/29/22   Alfonse Spruce, MD  baclofen (LIORESAL) 10 MG tablet Take 1 tablet (10 mg total) by mouth 3 (three) times daily as needed. for muscle spams 08/29/22   Babs Sciara, MD  estradiol (ESTRACE) 0.1 MG/GM vaginal cream Use twice a week as directed prn 03/03/23   Campbell Riches, NP  fexofenadine (ALLEGRA) 180 MG tablet Take 180 mg by mouth daily as needed for allergies.    [provider]  guaiFENesin (MUCINEX) 600 MG 12 hr tablet Take 600 mg by mouth 2 (two) times daily.    [provider]  naproxen sodium (ANAPROX) 220 MG tablet Take 440 mg by mouth daily as needed (headache).    [provider]  Rimegepant Sulfate (NURTEC) 75 MG TBDP Take 1 tablet (75 mg total) by mouth every other day. Patient not taking: Reported on 06/08/2023 11/04/22   Tommie Sams, DO  SUMAtriptan (IMITREX) 100 MG tablet TAKE 1 TABLET BY MOUTH AT THE 1ST SIGN OF A MIGRAINE. MAY REPEAT IN 2 HOURS, MAX 2 TABLETS PER 24 HOURS (MAY FILL EVERY 25 DAYS) 08/29/22   Babs Sciara, MD    Allergies   Allergies  Allergen Reactions   Codeine Nausea Only   Vicodin [Hydrocodone-Acetaminophen] Itching and Rash   Crestor [Rosuvastatin Calcium] Other (See Comments)    Muscle aches    Lipitor [Atorvastatin]     Constipation, fatigue, achy   Pravachol [Pravastatin Sodium] Other (See Comments)    Drowsiness and memory trouble   Singulair [Montelukast Sodium] Other (See Comments)    Headache   Augmentin [Amoxicillin-Pot Clavulanate]     Rash, redness, swelling along the right side of the face   Imipramine Other (See Comments)    Drowsiness   Topamax [Topiramate] Other (See Comments)    Drowsiness, stuttering and memory  trouble    Review of Systems  ROS  Neurologic Exam  Awake, alert, oriented Memory and concentration grossly intact Speech fluent, appropriate CN grossly intact Motor exam: Upper Extremities Deltoid Bicep Tricep Grip  Right 5/5 5/5 5/5 5/5  Left 5/5 5/5 5/5 5/5   Lower Extremities IP Quad PF DF EHL  Right 5/5 5/5 5/5 5/5 5/5  Left 5/5 5/5 5/5 5/5 5/5   Sensation grossly intact to LT  Imaging  MRI brian w/w/o reviewed and demonstrates multifocal heterogenously enhancing mass in the left frontal region extending across the corpus  Impression  - 62 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 We have also reviewed in detail the risks, benefits, and alternatives to the procedure. All questions were answered and Cannie Muckle provided informed consent to proceed.  Lisbeth Renshaw, MD [facility]

## 2023-06-09 NOTE — Anesthesia Procedure Notes (Addendum)
Procedure Name: Intubation Date/Time: 06/09/2023 10:51 AM  Performed by: April Holding, CRNAPre-anesthesia Checklist: Patient identified, Emergency Drugs available, Suction available and Patient being monitored Patient Re-evaluated:Patient Re-evaluated prior to induction Oxygen Delivery Method: Circle System Utilized Preoxygenation: Pre-oxygenation with 100% oxygen Induction Type: IV induction Ventilation: Mask ventilation without difficulty Laryngoscope Size: Miller and 2 Tube type: Oral Tube size: 7.0 mm Number of attempts: 1 Airway Equipment and Method: Stylet and Oral airway Placement Confirmation: ETT inserted through vocal cords under direct vision, positive ETCO2 and breath sounds checked- equal and bilateral Secured at: 21 cm Tube secured with: Tape Dental Injury: Teeth and Oropharynx as per pre-operative assessment

## 2023-06-09 NOTE — Op Note (Signed)
  NEUROSURGERY OPERATIVE NOTE   PREOP DIAGNOSIS:  Left frontal brain tumor   POSTOP DIAGNOSIS: Same  PROCEDURE: Stereotactic left frontal brain biopsy  SURGEON: Dr. Lisbeth Renshaw, MD  ASSISTANT: None  ANESTHESIA: General Endotracheal  EBL: Minimal  SPECIMENS: Left frontal tumor for permanent pathology  DRAINS: None  COMPLICATIONS: None  CONDITION: Hemodynamically stable to postanesthesia care unit  HISTORY: Joyce Jacobs is a 62 y.o. female initially presenting with relatively rapid onset of right sided weakness.  Imaging has revealed a heterogeneously enhancing mass within the left frontal lobe extending across the corpus callosum and likely down into the mesencephalon.  There is concern for high-grade glioma.  Stereotactic biopsy was requested after her case was discussed at the multidisciplinary neuro-oncology conference in order to obtain tissue for genetic testing which may guide targeted therapy.  The risks, benefits, and alternatives to surgery were all reviewed in detail with the patient and her husband.  After all questions were answered informed consent was obtained and witnessed.  PROCEDURE IN DETAIL: The patient was brought to the operating room. After induction of general anesthesia, the patient was positioned on the operative table in the [place] head holder in the supine position. All pressure points were meticulously padded.   The preoperative stereotactic MRI scan was then Co. registered with surface markers and an accuracy of approximately 1.5 mm was achieved.  Utilizing the preoperative scan, I selected a target for biopsy just superior and lateral to the anterior limb internal capsule which appeared to be easily accessible through the superior frontal gyrus, while the portion of the tumor adjacent to the frontal horn appeared to be possibly more vascular which I elected to avoid.  I then selected a entry point overlying the right frontal region through the  lateral aspect of the superior frontal gyrus.  After timeout was conducted, the stereotactic system was used to position the stereotactic biopsy arm with an accuracy of approximately 0.9 mm.  I then used the trajectory to identify the location of the planned skin incision.  This was infiltrated with local anesthetic with epinephrine.  Incision was then made sharply and carried down through the galea.  Hemostasis was secured with bipolar.  High-speed drill was then used to create a small bur hole along the planned trajectory.  The dura was incised.  Hemostasis was secured with bipolar electrocautery and morselized Gelfoam and thrombin.  The stereotactic biopsy needle was then introduced and again, accuracy was confirmed to be less than 1 mm.  The needle was then introduced under stereotactic guidance into the lesion.  Biopsies were taken anterior, lateral, and posteriorly.  The specimen was sent for permanent pathology.  At this point the biopsy needle was removed.  There was no active bleeding noted.  Donnetta Hutching was reapproximated with interrupted 3-0 Vicryl stitches and the skin was closed with staples.  Bacitracin ointment was applied.  At the end of the case all sponge, needle, instrument, and cottonoid counts were correct.  Patient was removed from the [place] head holder.  The patient was then extubated and taken to the postanesthesia care unit in stable hemodynamic condition.   Lisbeth Renshaw, MD [facility]

## 2023-06-10 ENCOUNTER — Encounter (HOSPITAL_COMMUNITY): Payer: Self-pay | Admitting: Neurosurgery

## 2023-06-10 MED ORDER — OXYCODONE-ACETAMINOPHEN 5-325 MG PO TABS: 1.0000 | ORAL_TABLET | Freq: Three times a day (TID) | ORAL | 0 refills | Status: AC | PRN

## 2023-06-10 NOTE — Evaluation (Signed)
Occupational Therapy Evaluation Patient Details Name: Joyce Jacobs MRN: [id number] DOB: 1961/06/12 Today's Date: 06/10/2023   History of Present Illness 62 y.o. female presents to [facility] hospital on 06/09/2023 for resection of L frontal mass. PMH includes asthma, GERD, HLD.   Clinical Impression   Patient is s/p Resection of L frontal mass surgery resulting in functional limitations due to the deficits listed below (see OT problem list). Pt at baseline with spouse (A) for bathing and dressing since onset of symptoms in September. Pt could benefit from resting hand splint for R hand.  Patient will benefit from skilled OT acutely to increase independence and safety with ADLS to allow discharge outpatient .        If plan is discharge home, recommend the following: A little help with walking and/or transfers;A little help with bathing/dressing/bathroom    Functional Status Assessment  Patient has had a recent decline in their functional status and demonstrates the ability to make significant improvements in function in a reasonable and predictable amount of time.  Equipment Recommendations  None recommended by OT (gait belt provided. ordered resting hand splint)    Recommendations for Other Services       Precautions / Restrictions Precautions Precautions: Fall Restrictions Weight Bearing Restrictions: No      Mobility Bed Mobility Overal bed mobility: Needs Assistance Bed Mobility: Rolling, Supine to Sit Rolling: Supervision   Supine to sit: Min assist     General bed mobility comments: needs (A) to elevate trunk from surface    Transfers Overall transfer level: Needs assistance Equipment used: 1 person hand held assist Transfers: Sit to/from Stand Sit to Stand: Min assist           General transfer comment: pt and spouse use face to face transfer      Balance Overall balance assessment: Mild deficits observed, not formally tested, History of Falls                                          ADL either performed or assessed with clinical judgement   ADL Overall ADL's : Needs assistance/impaired                     Lower Body Dressing: Moderate assistance;Sit to/from stand   Toilet Transfer: Minimal assistance;Ambulation;Regular Toilet;Grab bars   Toileting- Clothing Manipulation and Hygiene: Minimal assistance;Sit to/from stand Toileting - Clothing Manipulation Details (indicate cue type and reason): spouse reports fall off commode laterally to R x3 at home hygiene     Functional mobility during ADLs: Minimal assistance;Caregiver able to provide necessary level of assistance       Vision Baseline Vision/History: 1 Wears glasses Ability to See in Adequate Light: 0 Adequate Patient Visual Report: No change from baseline Vision Assessment?: Wears glasses for reading Additional Comments: able to read signs and clock in room correclty     Perception         Praxis         Pertinent Vitals/Pain Pain Assessment Pain Assessment: 0-10 Pain Score: 2  Pain Location: head at incision Pain Descriptors / Indicators: Discomfort Pain Intervention(s): Monitored during session, Repositioned, Limited activity within patient's tolerance     Extremity/Trunk Assessment Upper Extremity Assessment Upper Extremity Assessment: Right hand dominant;RUE deficits/detail RUE Deficits / Details: flaccid- does accuracy report light tactile input no activation of shoulder for shrug RUE Sensation: WNL RUE  Coordination: decreased gross motor;decreased fine motor   Lower Extremity Assessment Lower Extremity Assessment: Defer to PT evaluation   Cervical / Trunk Assessment Cervical / Trunk Assessment: Other exceptions (Lateral L rotation noted)   Communication Communication Communication: No apparent difficulties   Cognition Arousal: Alert Behavior During Therapy: WFL for tasks assessed/performed Overall Cognitive Status:  Impaired/Different from baseline Area of Impairment: Safety/judgement                         Safety/Judgement: Decreased awareness of safety     General Comments: spouse reports needing signs at home for safety to decrease fall     General Comments       Exercises     Shoulder Instructions      Home Living Family/patient expects to be discharged to:: Private residence Living Arrangements: Spouse/significant other;Children (son is 58 yo and can help)   Type of Home: House Home Access: Stairs to enter [employer] of Steps: 5 Entrance Stairs-Rails: Right;Left (working on getting ramp) Home Layout: One level;Other (Comment) (basement area- does not have to use)     Bathroom Shower/Tub: Walk-in shower;Door   [employer] Toilet: Standard     Home Equipment: Shower seat;Transport chair;Grab bars - tub/shower;BSC/3in1   Additional Comments: wants to get a toilet riser and hand held shower head soon for her. has cat - Charlie. Sometimes home alone for a few hours      Prior Functioning/Environment Prior Level of Function : Needs assist             Mobility Comments: spouse working comes home multiple times per day. ADLs Comments: spouse helping shower and dress, wears brief        OT Problem List: Decreased strength;Decreased activity tolerance;Impaired balance (sitting and/or standing);Decreased cognition;Decreased safety awareness;Decreased knowledge of use of DME or AE;Decreased knowledge of precautions;Impaired UE functional use      OT Treatment/Interventions: Self-care/ADL training;Therapeutic exercise;Energy conservation;DME and/or AE instruction;Manual therapy;Modalities;Therapeutic activities;Cognitive remediation/compensation;Patient/family education;Balance training    OT Goals(Current goals can be found in the care plan section) Acute Rehab OT Goals Patient Stated Goal: to go home OT Goal Formulation: With patient/family Time For Goal  Achievement: 06/24/23 Potential to Achieve Goals: Good  OT Frequency: Min 1X/week    Co-evaluation              AM-PAC OT "6 Clicks" Daily Activity     Outcome Measure Help from another person eating meals?: A Little Help from another person taking care of personal grooming?: A Little Help from another person toileting, which includes using toliet, bedpan, or urinal?: A Little Help from another person bathing (including washing, rinsing, drying)?: A Lot Help from another person to put on and taking off regular upper body clothing?: A Little Help from another person to put on and taking off regular lower body clothing?: A Lot 6 Click Score: 16   End of Session Equipment Utilized During Treatment: Gait belt Nurse Communication: Mobility status;Precautions;Weight bearing status  Activity Tolerance: Patient tolerated treatment well Patient left: in chair;Other (comment);with family/visitor present (PT starting assessment)  OT Visit Diagnosis: Unsteadiness on feet (R26.81);Muscle weakness (generalized) (M62.81)                Time: 1010-1035 OT Time Calculation (min): 25 min Charges:  OT General Charges $OT Visit: 1 Visit OT Evaluation $OT Eval Moderate Complexity: 1 Mod   Brynn, OTR/L  Acute Rehabilitation Services Office: [phone] .   Mateo Flow 06/10/2023, 10:57 AM

## 2023-06-10 NOTE — Evaluation (Signed)
Physical Therapy Evaluation Patient Details Name: Joss Deeg MRN: [id number] DOB: Sep 04, 1960 Today's Date: 06/10/2023  History of Present Illness  62 y.o. female presents to [facility] hospital on 06/09/2023 for resection of L frontal mass. PMH includes asthma, GERD, HLD.  Clinical Impression  Pt presents to PT with deficits in R sided strength, gait, balance, endurance, cognition. Pt with flaccid RUE and significant weakness in RLE, affecting her ability to mobilize. Pt has been requiring assistance from family recently for all transfers and ambulation within the home, utilizing a wheelchair primarily for any community mobility. Pt appears to be moving similarly to her recent baseline prior to surgery, able to perform face to face transfers and ambulate with one person assist. Pt requires assist for R foot clearance when negotiating stairs, also similar to pre-surgery. Pt and family would like to discharge home, HHPT recommended.        If plan is discharge home, recommend the following: A lot of help with walking and/or transfers;A lot of help with bathing/dressing/bathroom;Assistance with cooking/housework;Assist for transportation;Help with stairs or ramp for entrance;Direct supervision/assist for medications management;Direct supervision/assist for financial management   Can travel by private vehicle        Equipment Recommendations None recommended by PT (will benefit from further DME assessment once activity tolerance improves after discharge)  Recommendations for Other Services       Functional Status Assessment Patient has had a recent decline in their functional status and demonstrates the ability to make significant improvements in function in a reasonable and predictable amount of time.     Precautions / Restrictions Precautions Precautions: Fall Precaution Comments: R hemiplegia Restrictions Weight Bearing Restrictions: No      Mobility  Bed Mobility Overal bed  mobility: Needs Assistance Bed Mobility: Supine to Sit     Supine to sit: Min assist     General bed mobility comments: hand hold to elevate trunk    Transfers Overall transfer level: Needs assistance Equipment used: 1 person hand held assist Transfers: Sit to/from Stand Sit to Stand: Min assist           General transfer comment: posterior lean    Ambulation/Gait Ambulation/Gait assistance: Min assist Gait Distance (Feet): 20 Feet Assistive device: 1 person hand held assist Gait Pattern/deviations: Step-to pattern, Decreased dorsiflexion - right Gait velocity: reduced Gait velocity interpretation: <1.31 ft/sec, indicative of household ambulator   General Gait Details: pt with slowed step-to gait, R foot drag, increased left lean to facilitate clearance of RLE  Stairs Stairs: Yes Stairs assistance: Min assist Stair Management: One rail Left, Forwards, Step to pattern Number of Stairs: 5 General stair comments: assist at RLE for clearance of each step when ascending due to foot drop  Wheelchair Mobility     Tilt Bed    Modified Rankin (Stroke Patients Only)       Balance Overall balance assessment: Needs assistance Sitting-balance support: Single extremity supported, Feet unsupported Sitting balance-Leahy Scale: Poor Sitting balance - Comments: posterior loss of balance without back support Postural control: Posterior lean Standing balance support: Single extremity supported Standing balance-Leahy Scale: Poor Standing balance comment: minG-minA with LUE support                             Pertinent Vitals/Pain Pain Assessment Pain Assessment: Faces Faces Pain Scale: Hurts a little bit Pain Location: head Pain Descriptors / Indicators: Discomfort Pain Intervention(s): Monitored during session    Home Living Family/patient  expects to be discharged to:: Private residence Living Arrangements: Spouse/significant other;Children (49 y.o.  son) Available Help at Discharge: Family;Available PRN/intermittently Type of Home: House Home Access: Stairs to enter Entrance Stairs-Rails: Right;Left Entrance Stairs-Number of Steps: 5   Home Layout: One level;Other (Comment) Home Equipment: Shower seat;Transport chair;Grab bars - tub/shower;BSC/3in1;Wheelchair - manual Additional Comments: wants to get a toilet riser and hand held shower head soon for her. has cat - Charlie. Sometimes home alone for a few hours    Prior Function Prior Level of Function : Needs assist             Mobility Comments: spouse working comes home multiple times per day. ADLs Comments: spouse helping shower and dress, wears brief     Extremity/Trunk Assessment   Upper Extremity Assessment Upper Extremity Assessment: Defer to OT evaluation RUE Deficits / Details: flaccid- does accuracy report light tactile input no activation of shoulder for shrug RUE Sensation: WNL RUE Coordination: decreased gross motor;decreased fine motor    Lower Extremity Assessment Lower Extremity Assessment: RLE deficits/detail RLE Deficits / Details: 3-/5 knee extension with attempts at formal assessment, no knee buckling noted during standing or ambulation. PF/DF 3/5    Cervical / Trunk Assessment Cervical / Trunk Assessment: Normal  Communication   Communication Communication: No apparent difficulties  Cognition Arousal: Alert Behavior During Therapy: Flat affect Overall Cognitive Status: Impaired/Different from baseline Area of Impairment: Problem solving, Awareness                         Safety/Judgement: Decreased awareness of deficits   Problem Solving: Slow processing          General Comments General comments (skin integrity, edema, etc.): VSS on RA    Exercises     Assessment/Plan    PT Assessment Patient needs continued PT services  PT Problem List Decreased strength;Decreased activity tolerance;Decreased balance;Decreased  mobility;Decreased cognition;Decreased knowledge of use of DME;Decreased safety awareness;Decreased knowledge of precautions;Pain;Impaired tone       PT Treatment Interventions DME instruction;Gait training;Stair training;Functional mobility training;Therapeutic activities;Therapeutic exercise;Balance training;Cognitive remediation;Neuromuscular re-education;Patient/family education;Wheelchair mobility training    PT Goals (Current goals can be found in the Care Plan section)  Acute Rehab PT Goals Patient Stated Goal: to return home PT Goal Formulation: With patient/family Time For Goal Achievement: 06/24/23 Potential to Achieve Goals: Fair    Frequency Min 1X/week     Co-evaluation               AM-PAC PT "6 Clicks" Mobility  Outcome Measure Help needed turning from your back to your side while in a flat bed without using bedrails?: A Little Help needed moving from lying on your back to sitting on the side of a flat bed without using bedrails?: A Little Help needed moving to and from a bed to a chair (including a wheelchair)?: A Little Help needed standing up from a chair using your arms (e.g., wheelchair or bedside chair)?: A Little Help needed to walk in hospital room?: A Little Help needed climbing 3-5 steps with a railing? : A Little 6 Click Score: 18    End of Session Equipment Utilized During Treatment: Gait belt Activity Tolerance: Patient tolerated treatment well Patient left: in bed;with call bell/phone within reach;with family/visitor present Nurse Communication: Mobility status PT Visit Diagnosis: Other abnormalities of gait and mobility (R26.89);Muscle weakness (generalized) (M62.81);Other symptoms and signs involving the nervous system (R29.898);Hemiplegia and hemiparesis Hemiplegia - Right/Left: Right Hemiplegia - dominant/non-dominant: Dominant  Hemiplegia - caused by: Other cerebrovascular disease    Time: 1035-1053 PT Time Calculation (min) (ACUTE  ONLY): 18 min   Charges:   PT Evaluation $PT Eval Moderate Complexity: 1 Mod   PT General Charges $$ ACUTE PT VISIT: 1 Visit         Arlyss Gandy, PT, DPT Acute Rehabilitation Office [phone]   Arlyss Gandy 06/10/2023, 11:10 AM

## 2023-06-10 NOTE — Progress Notes (Signed)
  NEUROSURGERY PROGRESS NOTE   No complaints. No issues overnight  EXAM:  BP (!) 102/50   Pulse 67   Temp 98.6 F (37 C) (Oral)   Resp 17   Ht 5' 3.75" (1.619 m)   Wt 70.3 kg   LMP 03/05/2015   SpO2 96%   BMI 26.81 kg/m   Awake, alert, oriented Speech fluent, appropriate CN grossly intact 5/5 BUE/BLE Wound c/d/i  IMPRESSION:  62 y.o. female  POD#1 stereotactic left frontal brain biopsy, doing well  PLAN: - d/c home today   Lisbeth Renshaw, MD [facility]

## 2023-06-10 NOTE — Discharge Summary (Signed)
Physician Discharge Summary  Patient ID: Joyce Jacobs MRN: [id number] DOB/AGE: 1960-09-13 62 y.o.  Admit date: 06/09/2023 Discharge date: 06/10/2023  Admission Diagnoses:  Brain tumor  Discharge Diagnoses:  Same Principal Problem:   Brain tumor [facility])   Discharged Condition: Stable  Hospital Course:  Joyce Jacobs is a 62 y.o. female admitted after elective stereotactic left frontal biopsy.  Patient was monitored overnight and was at baseline neurologic condition.  She was therefore discharged home.  Treatments: Surgery -stereotactic left frontal brain biopsy  Discharge Exam: Blood pressure (!) 102/50, pulse 67, temperature 98.6 F (37 C), temperature source Oral, resp. rate 17, height 5' 3.75" (1.619 m), weight 70.3 kg, last menstrual period 03/05/2015, SpO2 96%. Awake, alert, oriented Speech fluent, appropriate CN grossly intact 5/5 BUE/BLE Wound c/d/i  Disposition: Discharge disposition: 01-Home or Self Care       Discharge Instructions     Call MD for:  redness, tenderness, or signs of infection (pain, swelling, redness, odor or green/yellow discharge around incision site)   Complete by: As directed    Call MD for:  temperature >100.4   Complete by: As directed    Diet - low sodium heart healthy   Complete by: As directed    Discharge instructions   Complete by: As directed    Walk at home as much as possible, at least 4 times / day   Increase activity slowly   Complete by: As directed    Lifting restrictions   Complete by: As directed    No lifting > 10 lbs   May shower / Bathe   Complete by: As directed    48 hours after surgery   May walk up steps   Complete by: As directed    No dressing needed   Complete by: As directed    Other Restrictions   Complete by: As directed    No bending/twisting at waist      Allergies as of 06/10/2023       Reactions   Codeine Nausea Only   Vicodin [hydrocodone-acetaminophen] Itching, Rash    Crestor [rosuvastatin Calcium] Other (See Comments)   Muscle aches    Lipitor [atorvastatin]    Constipation, fatigue, achy   Pravachol [pravastatin Sodium] Other (See Comments)   Drowsiness and memory trouble   Singulair [montelukast Sodium] Other (See Comments)   Headache   Augmentin [amoxicillin-pot Clavulanate]    Rash, redness, swelling along the right side of the face   Imipramine Other (See Comments)   Drowsiness   Topamax [topiramate] Other (See Comments)   Drowsiness, stuttering and memory trouble        Medication List     TAKE these medications    albuterol 108 (90 Base) MCG/ACT inhaler Commonly known as: VENTOLIN HFA inhale 2 puffs every 4-6 hours as needed for cough, wheeze, tightness in chest, shortness of breath   azelastine 0.1 % nasal spray Commonly known as: ASTELIN 2 sprays per nostril 1-2 times daily as needed.   baclofen 10 MG tablet Commonly known as: LIORESAL Take 1 tablet (10 mg total) by mouth 3 (three) times daily as needed. for muscle spams   dexamethasone 4 MG tablet Commonly known as: DECADRON Take 1 tablet (4 mg total) by mouth daily.   EPINEPHrine 0.3 mg/0.3 mL Soaj injection Commonly known as: EPI-PEN INJECT 0.3 MG INTO THE MUSCLE ONCE FOR 1 DOSE   estradiol 0.1 MG/GM vaginal cream Commonly known as: ESTRACE Use twice a week as directed prn  ezetimibe 10 MG tablet Commonly known as: ZETIA Take 1 tablet by mouth once daily What changed:  how much to take how to take this when to take this additional instructions   famotidine 40 MG tablet Commonly known as: PEPCID Take 1 tablet (40 mg total) by mouth at bedtime.   fexofenadine 180 MG tablet Commonly known as: ALLEGRA Take 180 mg by mouth daily as needed for allergies.   guaiFENesin 600 MG 12 hr tablet Commonly known as: MUCINEX Take 600 mg by mouth 2 (two) times daily.   naproxen sodium 220 MG tablet Commonly known as: ALEVE Take 440 mg by mouth daily as needed  (headache).   Nurtec 75 MG Tbdp Generic drug: Rimegepant Sulfate Take 1 tablet (75 mg total) by mouth every other day.   oxyCODONE-acetaminophen 5-325 MG tablet Commonly known as: PERCOCET/ROXICET Take 1 tablet by mouth every 8 (eight) hours as needed for up to 3 days for severe pain (pain score 7-10).   Repatha SureClick 140 MG/ML Soaj Generic drug: Evolocumab Inject 140 mg into the skin every 14 (fourteen) days.   SUMAtriptan 100 MG tablet Commonly known as: IMITREX TAKE 1 TABLET BY MOUTH AT THE 1ST SIGN OF A MIGRAINE. MAY REPEAT IN 2 HOURS, MAX 2 TABLETS PER 24 HOURS (MAY FILL EVERY 25 DAYS)               Discharge Care Instructions  (From admission, onward)           Start     Ordered   06/10/23 0000  No dressing needed        06/10/23 0930            Follow-up Information     Lisbeth Renshaw, MD Follow up in 2 week(s).   Specialty: Neurosurgery Contact information: [address] [place] [address] [phone]                 Signed: Jackelyn Hoehn 06/10/2023, 9:31 AM

## 2023-06-10 NOTE — Progress Notes (Signed)
Discussed with Dr. Conchita Paris. Patient presented for biopsy. Going home today. No PCCM needs at this time.   Durel Salts, MD Pulmonary and Critical Care Medicine [facility] 06/10/2023 9:00 AM Pager: see AMION  If no response to pager, please call critical care on call (see AMION) until 7pm After 7:00 pm call Elink

## 2023-06-10 NOTE — Anesthesia Postprocedure Evaluation (Signed)
Anesthesia Post Note  Patient: Joyce Jacobs  Procedure(s) Performed: Left Stereotactic BRAIN BIOPSY (Left: Head) APPLICATION OF CRANIAL NAVIGATION (Left: Head)     Patient location during evaluation: PACU Anesthesia Type: General Level of consciousness: awake and alert Pain management: pain level controlled Vital Signs Assessment: post-procedure vital signs reviewed and stable Respiratory status: spontaneous breathing, nonlabored ventilation, respiratory function stable and patient connected to nasal cannula oxygen Cardiovascular status: blood pressure returned to baseline and stable Postop Assessment: no apparent nausea or vomiting Anesthetic complications: no   No notable events documented.  Last Vitals:  Vitals:   06/10/23 0800 06/10/23 0900  BP: (!) 86/58 (!) 102/50  Pulse: (!) 53 67  Resp: 11 17  Temp: 37 C   SpO2: 95% 96%    Last Pain:  Vitals:   06/10/23 0800  TempSrc: Oral  PainSc: 0-No pain                 Earl Lites P Carmeline Kowal

## 2023-06-10 NOTE — TOC CM/SW Note (Signed)
Transition of Care [facility]) - Inpatient Brief Assessment   Patient Details  Name: Joyce Jacobs MRN: [id number] Date of Birth: [birth year]  Transition of Care [facility]) CM/SW Contact:    Mearl Latin, LCSW Phone Number: 06/10/2023, 9:31 AM   Clinical Narrative: Patient admitted from home with spouse undergoing treatment for brain tumor and biopsy. No current TOC needs identified at this time.     Transition of Care Asessment: Insurance and Status: Insurance coverage has been reviewed Patient has primary care physician: Yes Home environment has been reviewed: From home Prior level of function:: Independent Prior/Current Home Services: No current home services Social Determinants of Health Reivew: SDOH reviewed no interventions necessary Readmission risk has been reviewed: Yes Transition of care needs: no transition of care needs at this time

## 2023-06-11 ENCOUNTER — Other Ambulatory Visit: Payer: Self-pay | Admitting: Neurosurgery

## 2023-06-15 ENCOUNTER — Inpatient Hospital Stay: Payer: BC Managed Care – PPO

## 2023-06-16 ENCOUNTER — Inpatient Hospital Stay: Payer: BC Managed Care – PPO | Admitting: Internal Medicine

## 2023-06-16 ENCOUNTER — Telehealth: Payer: Self-pay | Admitting: Pharmacy Technician

## 2023-06-16 ENCOUNTER — Encounter: Payer: Self-pay | Admitting: Internal Medicine

## 2023-06-16 ENCOUNTER — Telehealth: Payer: Self-pay | Admitting: Pharmacist

## 2023-06-16 ENCOUNTER — Telehealth: Payer: Self-pay | Admitting: *Deleted

## 2023-06-16 ENCOUNTER — Other Ambulatory Visit (HOSPITAL_COMMUNITY): Payer: Self-pay

## 2023-06-16 ENCOUNTER — Other Ambulatory Visit: Payer: Self-pay | Admitting: Radiation Therapy

## 2023-06-16 ENCOUNTER — Telehealth: Payer: Self-pay | Admitting: Radiation Oncology

## 2023-06-16 VITALS — BP 129/89 | HR 63 | Temp 97.5°F | Resp 18 | Wt 152.3 lb

## 2023-06-16 DIAGNOSIS — C719 Malignant neoplasm of brain, unspecified: Secondary | ICD-10-CM | POA: Diagnosis not present

## 2023-06-16 DIAGNOSIS — Z7189 Other specified counseling: Secondary | ICD-10-CM | POA: Diagnosis not present

## 2023-06-16 DIAGNOSIS — G939 Disorder of brain, unspecified: Secondary | ICD-10-CM | POA: Diagnosis present

## 2023-06-16 MED ORDER — TEMOZOLOMIDE 20 MG PO CAPS: 20.0000 mg | ORAL_CAPSULE | Freq: Every day | ORAL | 0 refills | Status: DC

## 2023-06-16 MED ORDER — TEMOZOLOMIDE 100 MG PO CAPS
100.0000 mg | ORAL_CAPSULE | Freq: Every day | ORAL | 0 refills | Status: DC
  Filled 2023-06-18: qty 28, 28d supply, fill #0

## 2023-06-16 MED ORDER — TEMOZOLOMIDE 100 MG PO CAPS: 100.0000 mg | ORAL_CAPSULE | Freq: Every day | ORAL | 0 refills | Status: DC

## 2023-06-16 MED ORDER — ONDANSETRON HCL 8 MG PO TABS
8.0000 mg | ORAL_TABLET | Freq: Three times a day (TID) | ORAL | 1 refills | Status: DC | PRN
Start: 2023-06-23 — End: 2023-07-14
  Filled 2023-06-16: qty 18, 21d supply, fill #0

## 2023-06-16 MED ORDER — TEMOZOLOMIDE 20 MG PO CAPS
20.0000 mg | ORAL_CAPSULE | Freq: Every day | ORAL | 0 refills | Status: DC
  Filled 2023-06-18: qty 28, 28d supply, fill #0

## 2023-06-16 NOTE — Progress Notes (Signed)
[facility] [address]  [place], [place] [address] [phone]   Interval Evaluation  Date of Service: 06/16/23 Patient Name: Joyce Jacobs Patient MRN: [id number] Patient DOB: [birth year] Provider: Henreitta Leber, MD  Identifying Statement:  [name] is a 62 y.o. female with left frontal glioblastoma   Oncologic History: Oncology History  Glioblastoma (HCC)  06/09/2023 Initial Diagnosis   Glioblastoma (HCC)   06/09/2023 Surgery   Stereotactic biosy with Dr. Conchita Paris; path is high grade glioma, IDH pending     Biomarkers:  MGMT Unknown.  IDH 1/2 Unknown.  EGFR Unknown  TERT Unknown   Interval History: Theora Tumblin presents today for follow up, now having completed brain biopsy.  She tolerated procedure well without complication.  In the past few days, however, her weakness on the right side has worsened.  She is no longer meaningfully using the right arm, and is using a wheelchair for transportation due to leg weakness.  There may some weakness of the left leg as well, milder.  Continues to have difficulties with speech and communication.  Remains on decadron 4mg  daily, but now starting to have some swelling in her feet.  No other changes to medications, otherwise denies seizures, headaches.  H+P (06/01/23) Patient presents to review recent neurologic symptoms.  She describes a roughly 1 month history of progressive right sided weakness, affecting the arm, leg and face.  This has led to several falls, most notably while she was overseas in [place] with her husband in early September.  Her PCP ordered an MRI of the brain which demonstrated a multifocal enhancing mass within the left frontal lobe.  Patient is left handed and doesn't endorse any language symptoms.  Decadron 4mg  TID for the past week has not improved symptoms meaningfully.  Otherwise denies headaches and seizures.   Medications: Current Outpatient  Medications on File Prior to Visit  Medication Sig Dispense Refill   albuterol (VENTOLIN HFA) 108 (90 Base) MCG/ACT inhaler inhale 2 puffs every 4-6 hours as needed for cough, wheeze, tightness in chest, shortness of breath 1 each 1   azelastine (ASTELIN) 0.1 % nasal spray 2 sprays per nostril 1-2 times daily as needed. (Patient not taking: Reported on 06/01/2023) 30 mL 5   baclofen (LIORESAL) 10 MG tablet Take 1 tablet (10 mg total) by mouth 3 (three) times daily as needed. for muscle spams 90 tablet 5   dexamethasone (DECADRON) 4 MG tablet Take 1 tablet (4 mg total) by mouth daily.     EPINEPHRINE 0.3 mg/0.3 mL IJ SOAJ injection INJECT 0.3 MG INTO THE MUSCLE ONCE FOR 1 DOSE 2 each 0   estradiol (ESTRACE) 0.1 MG/GM vaginal cream Use twice a week as directed prn 42.5 g 5   Evolocumab (REPATHA SURECLICK) 140 MG/ML SOAJ Inject 140 mg into the skin every 14 (fourteen) days. 2 mL 5   ezetimibe (ZETIA) 10 MG tablet Take 1 tablet by mouth once daily (Patient taking differently: Take 10 mg by mouth daily.) 90 tablet 1   famotidine (PEPCID) 40 MG tablet Take 1 tablet (40 mg total) by mouth at bedtime. 90 tablet 3   fexofenadine (ALLEGRA) 180 MG tablet Take 180 mg by mouth daily as needed for allergies.     guaiFENesin (MUCINEX) 600 MG 12 hr tablet Take 600 mg by mouth 2 (two) times daily.     naproxen sodium (ANAPROX) 220 MG tablet Take 440 mg by mouth daily as needed (headache).  Rimegepant Sulfate (NURTEC) 75 MG TBDP Take 1 tablet (75 mg total) by mouth every other day. (Patient not taking: Reported on 06/08/2023) 15 tablet 0   SUMAtriptan (IMITREX) 100 MG tablet TAKE 1 TABLET BY MOUTH AT THE 1ST SIGN OF A MIGRAINE. MAY REPEAT IN 2 HOURS, MAX 2 TABLETS PER 24 HOURS (MAY FILL EVERY 25 DAYS) 12 tablet 3   No current facility-administered medications on file prior to visit.    Allergies:  Allergies  Allergen Reactions   Codeine Nausea Only   Vicodin [Hydrocodone-Acetaminophen] Itching and Rash    Crestor [Rosuvastatin Calcium] Other (See Comments)    Muscle aches    Lipitor [Atorvastatin]     Constipation, fatigue, achy   Pravachol [Pravastatin Sodium] Other (See Comments)    Drowsiness and memory trouble   Singulair [Montelukast Sodium] Other (See Comments)    Headache   Augmentin [Amoxicillin-Pot Clavulanate]     Rash, redness, swelling along the right side of the face   Imipramine Other (See Comments)    Drowsiness   Topamax [Topiramate] Other (See Comments)    Drowsiness, stuttering and memory trouble   Past Medical History:  Past Medical History:  Diagnosis Date   Allergy    Anemia    last check up HGb was normal   Angio-edema    Asthma    allergy related or with respiratory illiness   Chronic sinusitis    Eczema    GERD (gastroesophageal reflux disease)    Headache(784.0)    migraines   Hyperlipidemia    Pneumonia    x 1   PONV (postoperative nausea and vomiting)    Right sided weakness 05/27/2023   Urticaria    Past Surgical History:  Past Surgical History:  Procedure Laterality Date   ADENOIDECTOMY     APPLICATION OF CRANIAL NAVIGATION Left 06/09/2023   Procedure: APPLICATION OF CRANIAL NAVIGATION;  Surgeon: Lisbeth Renshaw, MD;  Location: [facility] OR;  Service: Neurosurgery;  Laterality: Left;   BREAST BIOPSY Left 02/2022   CESAREAN SECTION     times three   COLONOSCOPY  05/12/2011   Procedure: COLONOSCOPY;  Surgeon: Corbin Ade, MD;  Location: [facility] SUITE;  Service: Endoscopy;  Laterality: N/A;  8:30 AM   DILATION AND CURETTAGE OF UTERUS     ENDOMETRIAL ABLATION     FACIAL FRACTURE SURGERY  08/26/1987   broken nose and cheek bone   HEAD & NECK WOUND REPAIR / CLOSURE     times 5   PR DURAL GRAFT SPINAL Left 06/09/2023   Procedure: Left Stereotactic BRAIN BIOPSY;  Surgeon: Lisbeth Renshaw, MD;  Location[facility] OR;  Service: Neurosurgery;  Laterality: Left;   ROBOTIC ASSISTED TOTAL HYSTERECTOMY WITH BILATERAL SALPINGO OOPHERECTOMY Bilateral  03/05/2015   Procedure: ROBOTIC ASSISTED TOTAL HYSTERECTOMY WITH BILATERAL SALPINGO OOPHORECTOMY;  Surgeon: Olivia Mackie, MD;  Location: [facility] ORS;  Service: Gynecology;  Laterality: Bilateral;   SINOSCOPY     SKIN GRAFT Right    Eardrum   TONSILLECTOMY AND ADENOIDECTOMY     TYMPANOPLASTY  [id number]   TYMPANOSTOMY TUBE PLACEMENT     Social History:  Social History   Socioeconomic History   Marital status: Married    Spouse name: Not on file   Number of children: Not on file   Years of education: Not on file   Highest education level: Master's degree (e.g., MA, MS, MEng, MEd, MSW, MBA)  Occupational History   Not on file  Tobacco Use   Smoking status: Never  Smokeless tobacco: Never  Vaping Use   Vaping status: Never Used  Substance and Sexual Activity   Alcohol use: Yes    Alcohol/week: 3.0 standard drinks of alcohol    Types: 3 Standard drinks or equivalent per week   Drug use: No   Sexual activity: Yes    Birth control/protection: Surgical    Comment: Hysterectomy  Other Topics Concern   Not on file  Social History Narrative   Not on file   Social Determinants of Health   Financial Resource Strain: Low Risk  (04/02/2023)   Overall Financial Resource Strain (CARDIA)    Difficulty of Paying Living Expenses: Not hard at all  Food Insecurity: No Food Insecurity (04/02/2023)   Hunger Vital Sign    Worried About Running Out of Food in the Last Year: Never true    Ran Out of Food in the Last Year: Never true  Transportation Needs: No Transportation Needs (04/02/2023)   PRAPARE - Administrator, Civil Service (Medical): No    Lack of Transportation (Non-Medical): No  Physical Activity: Insufficiently Active (04/02/2023)   Exercise Vital Sign    Days of Exercise per Week: 3 days    Minutes of Exercise per Session: 30 min  Stress: No Stress Concern Present (04/02/2023)   [employer] of Occupational Health - Occupational Stress Questionnaire    Feeling of Stress  : Only a little  Social Connections: Moderately Isolated (04/02/2023)   Social Connection and Isolation Panel [NHANES]    Frequency of Communication with Friends and Family: Once a week    Frequency of Social Gatherings with Friends and Family: Once a week    Attends Religious Services: More than 4 times per year    Active Member of [employer] or Organizations: No    Attends Engineer, structural: Not on file    Marital Status: Married  Catering manager Violence: Not on file   Family History:  Family History  Problem Relation Age of Onset   Hypertension Mother    Diabetes Mother    Heart attack Father    Hyperlipidemia Father    Ovarian cancer Maternal Grandmother     Review of Systems: Constitutional: Doesn't report fevers, chills or abnormal weight loss Eyes: Doesn't report blurriness of vision Ears, nose, mouth, throat, and face: Doesn't report sore throat Respiratory: Doesn't report cough, dyspnea or wheezes Cardiovascular: Doesn't report palpitation, chest discomfort  Gastrointestinal:  Doesn't report nausea, constipation, diarrhea GU: Doesn't report incontinence Skin: Doesn't report skin rashes Neurological: Per HPI Musculoskeletal: Doesn't report joint pain Behavioral/Psych: Doesn't report anxiety  Physical Exam: Vitals:   06/16/23 0858  BP: 129/89  Pulse: 63  Resp: 18  Temp: (!) 97.5 F (36.4 C)  SpO2: 98%   KPS: 60. General: Alert, cooperative, pleasant, in no acute distress Head: Normal EENT: No conjunctival injection or scleral icterus.  Lungs: Resp effort normal Cardiac: Regular rate Abdomen: Non-distended abdomen Skin: No rashes cyanosis or petechiae. Extremities: No clubbing or edema  Neurologic Exam: Mental Status: Awake, alert, attentive to examiner. Oriented to self and environment. Language is modestly impaired with regards to fluency.  Cranial Nerves: Visual acuity is grossly normal. Visual fields are full. Extra-ocular movements intact. No  ptosis. R UMN facial paresis. Motor: Tone and bulk are normal. Power is 2/5 in right leg, 2/5 in right arm. Reflexes are symmetric, no pathologic reflexes present.  Sensory: Intact to light touch Gait: Non ambulatory   Labs: I have reviewed the data as  listed    Component Value Date/Time   NA 138 06/09/2023 0819   NA 139 03/04/2023 0752   K 4.3 06/09/2023 0819   CL 103 06/09/2023 0819   CO2 24 06/09/2023 0819   GLUCOSE 101 (H) 06/09/2023 0819   BUN 13 06/09/2023 0819   BUN 15 03/04/2023 0752   CREATININE 0.69 06/09/2023 0819   CREATININE 0.81 03/10/2016 0722   CALCIUM 9.3 06/09/2023 0819   PROT 6.4 03/04/2023 0752   ALBUMIN 4.4 03/04/2023 0752   AST 15 03/04/2023 0752   ALT 11 03/04/2023 0752   ALKPHOS 64 03/04/2023 0752   BILITOT 0.3 03/04/2023 0752   GFRNONAA >60 06/09/2023 0819   GFRAA 102 02/23/2018 0806   Lab Results  Component Value Date   WBC 16.8 (H) 06/09/2023   NEUTROABS 3.3 03/04/2023   HGB 14.8 06/09/2023   HCT 46.2 (H) 06/09/2023   MCV 91.8 06/09/2023   PLT 207 06/09/2023    Imaging: MR BRAIN W WO CONTRAST  Result Date: 06/05/2023 CLINICAL DATA:  Malignant neoplasm of frontal lobe. Stereotactic radiosurgery planning. EXAM: MRI HEAD WITHOUT AND WITH CONTRAST TECHNIQUE: Multiplanar, multiecho pulse sequences of the brain and surrounding structures were obtained without and with intravenous contrast. CONTRAST:  7mL GADAVIST GADOBUTROL 1 MMOL/ML IV SOLN COMPARISON:  None Available. FINDINGS: Brain: Findings consistent with multifocal high-grade glioma. The dominant necrotic component in the posterior left frontal lobe measures up to 3.0 x 1.9 cm (axial image 228 series 1100). The dominant enhancing component measures up to 46 x 42 mm (image 219 series 1100) with extension across the midline via the corpus callosum and associated ependymal enhancement along the body of the left lateral ventricle and septum pellucidum (axial image 209 series 1100). Multiple  satellite nodules extending anteriorly and inferiorly along the left frontal operculum and insula (for example, images 173 through 185 series 1100). Extensive T2 hyperintense signal abnormality throughout nearly the entire left frontal lobe with extension along the left basal ganglia, internal capsule and thalamus into the left pons (axial image 17 series 4) and along the left insula into the anterior left temporal lobe (axial image 20 series 4), suspicious for additional sites of nonenhancing tumor. Associated 10 mm of rightward midline shift. No acute infarct or hemorrhage. No hydrocephalus or extra-axial collection. Vascular: Normal flow voids and vessel enhancement. Skull and upper cervical spine: Normal marrow signal and enhancement. Sinuses/Orbits: Negative. Other: None. IMPRESSION: Findings consistent with multifocal high-grade glioma centered within the left frontal lobe, as described above. Associated 10 mm of rightward midline shift. Electronically Signed   By: Orvan Falconer M.D.   On: 06/05/2023 15:26   MR Brain W Wo Contrast  Result Date: 05/27/2023 CLINICAL DATA:  Right-sided weakness.  Ataxia.  Frequent falling. EXAM: MRI HEAD WITHOUT AND WITH CONTRAST TECHNIQUE: Multiplanar, multiecho pulse sequences of the brain and surrounding structures were obtained without and with intravenous contrast. CONTRAST:  7mL GADAVIST GADOBUTROL 1 MMOL/ML IV SOLN COMPARISON:  None Available. FINDINGS: Brain: There is an aggressive infiltrating mass lesion with its epicenter in the left hemisphere consistent with glioblastoma multiform. Mass extends extensively through the left frontal lobe with crossing of the corpus callosum into the deep white matter of the right hemisphere. Extensive tumor is present in the insular region and left temporal tip. Tumor extends within the basal ganglia region and upper thalamus, through the left midbrain to the left pons. After contrast administration, there are multiple foci of  abnormal enhancement. Index focus adjacent to the lateral  body of the left lateral ventricle measures 1.6 x 1.1 x 1.7 cm. There are a few areas of necrosis. There are a few areas of punctate hemosiderin deposition but no frank hematoma. There is mass effect with left-to-right shift of 8 mm. No hydrocephalus. No extra-axial collection. Vascular: Major vessels at the base of the brain show flow. Skull and upper cervical spine: Negative Sinuses/Orbits: Clear presently. Previous functional endoscopic sinus surgery. Other: None IMPRESSION: Aggressive infiltrating mass lesion with its epicenter in the left hemisphere consistent with glioblastoma multiform. Extensive tumor in the left frontal lobe with crossing of the corpus callosum into the deep white matter of the right hemisphere. Extensive tumor in the insular region and left temporal tip. Tumor extends within the left basal ganglia region and upper thalamus, through the left midbrain to the left pons. Multiple foci of abnormal enhancement. Index focus adjacent to the lateral body of the left lateral ventricle measures 1.6 x 1.1 x 1.7 cm. There are a few areas of necrosis. There is mass effect with left-to-right shift of 8 mm. These results were called by telephone at the time of interpretation on 05/27/2023 at 1:59 pm to provider [facility] office , who verbally acknowledged these results. Electronically Signed   By: Paulina Fusi M.D.   On: 05/27/2023 14:00     Pathology:   SURGICAL PATHOLOGY CASE: [id number] PATIENT: Kyra Searles Surgical Pathology Report  Clinical History: brain tumor (cm)  FINAL MICROSCOPIC DIAGNOSIS:  A. BRAIN TUMOR, LEFT FRONTAL, BIOPSY:      High-grade glioma, WHO grade 4.      See comment.  COMMENT:  The specimen demonstrates a high-grade glial neoplasm with marked nuclear pleomorphism and perivascular condensation. Focal necrosis is seen. The overall features are in keeping with a high-grade glioma  with differential diagnosis between grade 4 astrocytoma, IDH mutant and glioblastoma, IDH wildtype. IDH mutation test will be performed to further subclassify the tumor and the result will be reported in an addendum.  The case was peer-reviewed by Dr. Venetia Night who agrees with the diagnosis.   GROSS DESCRIPTION:  Received fresh are 3 fragments of pale pink to minimally hemorrhagic soft tissue ranging from 0.3 to 0.5 cm.  The specimen is entirely submitted in 1 block. (KW, 06/09/2023)  Final Diagnosis performed by Lance Coon, MD.   Electronically signed 06/10/2023     Assessment/Plan Glioblastoma [facility])  We appreciate the opportunity to participate in the care of [facility].  She presents with clinical and radiographic syndrome consistent with high grade glioma, pending IDH status.   We had an extensive conversation with her and her husband regarding pathology, prognosis, and available treatment pathways.  Her tumor has been aggressive in behavior, given changes demonstrated over just 10 day interval on recent MRI.  Her functional status has declined in just the past several days.  They still want to move forward with aggressive treatment at this time.  We are still awaiting IDH status, but this will not change present treatment plan/approach.   We ultimately recommended proceeding with course of intensity modulated radiation therapy and concurrent daily Temozolomide.  Radiation will be administered Mon-Fri over 6 weeks, Temodar will be dosed at 75mg /m2 to be given daily over 42 days.  We reviewed side effects of temodar, including fatigue, nausea/vomiting, constipation, and cytopenias.  Informed consent was verbally obtained at bedside to proceed with oral chemotherapy.  Chemotherapy should be held for the following:  ANC less than 1,000  Platelets less than 100,000  LFT  or creatinine greater than 2x ULN  If clinical concerns/contraindications develop  Every 2 weeks  during radiation, labs will be checked accompanied by a clinical evaluation in the brain tumor clinic.  We will arrange consultation and CT-SIM with radiation oncology team.  Recommended decreasing decadron to 2mg  daily given potential mild steroid myopathy with left hip extension.  We also discussed and patient consented for additional tumor profiling and sequencing through [facility].  Advanced tumor profiling could help identify actionable mutation for targeted therapy and lead to direct clinical benefit.     She will continue working with PT as well.  All questions were answered. The patient knows to call the clinic with any problems, questions or concerns. No barriers to learning were detected.  The total time spent in the encounter was 40 minutes and more than 50% was on counseling and review of test results   Henreitta Leber, MD Medical Director of Neuro-Oncology [facility] 06/16/23 8:59 AM

## 2023-06-16 NOTE — Telephone Encounter (Signed)
Molecular Profiling Requisition, patient demographic sheet, copy of insurance card, pathology report, today's office visit note & MRI report faxed to [facility].  Fax confirmation received.

## 2023-06-16 NOTE — Telephone Encounter (Signed)
Oral Oncology Patient Advocate Encounter  Prior Authorization for temozolomide has been approved.    PA# [id number] Effective dates: 06/16/23 through 06/15/24  Patients co-pay is $100 per fill, per strength.  Total cost for 42 days of therapy will be $400. I have discussed this with the patient and they have agreed to proceed.   Jinger Neighbors, CPhT-Adv Oncology Pharmacy Patient Advocate [facility] Direct Number: [phone]  Fax: [phone]

## 2023-06-16 NOTE — Telephone Encounter (Signed)
10/22 @ 10:05 am Left voicemail for patient to call office to be schedule for consult.

## 2023-06-16 NOTE — Progress Notes (Signed)

## 2023-06-16 NOTE — Telephone Encounter (Signed)
10/22 @ 1:51 pm Spoke to patient about being schedule for 10/23 @ 12:30 pm.  Patient would like to speak to her husband first before schedule.  Waiting on call back to confirm.

## 2023-06-16 NOTE — Telephone Encounter (Signed)
Oral Oncology Patient Advocate Encounter   Received notification that prior authorization for temozolomide is required.   PA submitted on 06/16/23 Key [id number] Status is pending     Jinger Neighbors, CPhT-Adv Oncology Pharmacy Patient Advocate [facility] Direct Number: [phone]  Fax: [phone]

## 2023-06-16 NOTE — Telephone Encounter (Signed)
Oral Oncology Pharmacist Encounter  Received new prescription for Temodar (temozolomide) for the treatment of glioblastoma in conjunction with radiation, planned duration of temozolomide is 42 days.  CBC and BMP from 06/09/23 assessed, no relevant lab abnormalities requiring baseline dose adjustment required at this time. Prescription dose and frequency assessed for appropriateness.  Current medication list in Epic reviewed, no relevant/significant DDIs with Temodar identified.  Evaluated chart and no patient barriers to medication adherence noted.   Patient agreement for treatment documented in MD note on 06/16/23.  Prescription has been e-scribed to the [facility] for benefits analysis and approval.  Oral Oncology Clinic will continue to follow for insurance authorization, copayment issues, initial counseling and start date.  Lenord Carbo, PharmD, BCPS, [facility] Hematology/Oncology Clinical Pharmacist [facility] Oral Chemotherapy Navigation Clinics [phone] 06/16/2023 12:45 PM

## 2023-06-17 ENCOUNTER — Telehealth: Payer: Self-pay | Admitting: *Deleted

## 2023-06-17 ENCOUNTER — Ambulatory Visit
Admission: RE | Admit: 2023-06-17 | Discharge: 2023-06-17 | Disposition: A | Discharge status: Home / Self Care | Payer: BC Managed Care – PPO | Source: Ambulatory Visit | Attending: Radiation Oncology | Admitting: Radiation Oncology

## 2023-06-17 VITALS — BP 123/73 | HR 69 | Temp 98.2°F | Resp 16 | Ht 63.5 in | Wt 152.0 lb

## 2023-06-17 DIAGNOSIS — G720 Drug-induced myopathy: Secondary | ICD-10-CM | POA: Insufficient documentation

## 2023-06-17 DIAGNOSIS — K219 Gastro-esophageal reflux disease without esophagitis: Secondary | ICD-10-CM | POA: Diagnosis not present

## 2023-06-17 DIAGNOSIS — Z8041 Family history of malignant neoplasm of ovary: Secondary | ICD-10-CM | POA: Insufficient documentation

## 2023-06-17 DIAGNOSIS — T380X5A Adverse effect of glucocorticoids and synthetic analogues, initial encounter: Secondary | ICD-10-CM | POA: Diagnosis not present

## 2023-06-17 DIAGNOSIS — E785 Hyperlipidemia, unspecified: Secondary | ICD-10-CM | POA: Diagnosis not present

## 2023-06-17 DIAGNOSIS — R270 Ataxia, unspecified: Secondary | ICD-10-CM | POA: Insufficient documentation

## 2023-06-17 DIAGNOSIS — C711 Malignant neoplasm of frontal lobe: Secondary | ICD-10-CM

## 2023-06-17 DIAGNOSIS — Z79630 Long term (current) use of alkylating agent: Secondary | ICD-10-CM | POA: Insufficient documentation

## 2023-06-17 DIAGNOSIS — Z7952 Long term (current) use of systemic steroids: Secondary | ICD-10-CM | POA: Insufficient documentation

## 2023-06-17 NOTE — Progress Notes (Signed)
Has armband been applied?  yes  Does patient have an allergy to IV contrast dye?: No   Has patient ever received premedication for IV contrast dye?:  n/a  Does patient take metformin?: No  If patient does take metformin when was the last dose: n/a  Date of lab work: 10/15/202 BUN: 13 CR: 0.69 eGfr: >60  IV site:   Has IV site been added to flowsheet?  yes  BP 117/73   Pulse 63   Temp 98.3 F (36.8 C)   Resp 17   Wt 198 lb 3.2 oz (89.9 kg)   LMP 03/05/2015   SpO2 99%   BMI 34.56 kg/m

## 2023-06-17 NOTE — Progress Notes (Signed)
Radiation Oncology         [phone] ________________________________  Name: Joyce Jacobs        MRN: [id number]  Date of Service: 06/17/2023 DOB: Feb 18, 1961  HQ:IONGEX, Jonna Coup, MD  Barbaraann Cao Georgeanna Lea, MD     REFERRING PHYSICIAN: Henreitta Leber, MD   DIAGNOSIS: 62 yo female with a glioblastoma of the frontal lobe, WHO grade 4  The encounter diagnosis was Glioblastoma of frontal lobe (HCC).   HISTORY OF PRESENT ILLNESS: Joyce Jacobs is a 62 y.o. female seen at the request of Dr. Barbaraann Cao. She originally presented to her PCP for progressive right sided weakness, affecting the arm, leg, and face, which led to several falls. Her PCP ordered an MRI of the brain demonstrating a multifocal enhancing mass within the left frontal lobe. She was placed on Decadron 4 mg TID which had not shown an improvement in symptoms. Her case was presented at our tumor board and the consensus was to proceed with a biopsy. Patient proceeded with a biopsy under the care of Dr. Conchita Paris on 06/09/2023. Final pathology revealed a high-grade glioma, WHO grade 4.   Patient met with Dr. Barbaraann Cao on 06/16/2023. He reviewed the pathology results and recommended 6 weeks of concurrent chemoradiation. She was kindly referred to [place] today to discuss radiation therapy.   Dr. Barbaraann Cao recommended decreasing her steroid due to the potential steroid induced myopathy she was experiencing. Patient is currently taking 2 mg of Decadron daily.   PREVIOUS RADIATION THERAPY: No   PAST MEDICAL HISTORY:  Past Medical History:  Diagnosis Date   Allergy    Anemia    last check up HGb was normal   Angio-edema    Asthma    allergy related or with respiratory illiness   Chronic sinusitis    Eczema    GERD (gastroesophageal reflux disease)    Headache(784.0)    migraines   Hyperlipidemia    Pneumonia    x 1   PONV (postoperative nausea and vomiting)    Right sided weakness 05/27/2023   Urticaria        PAST  SURGICAL HISTORY: Past Surgical History:  Procedure Laterality Date   ADENOIDECTOMY     APPLICATION OF CRANIAL NAVIGATION Left 06/09/2023   Procedure: APPLICATION OF CRANIAL NAVIGATION;  Surgeon: Lisbeth Renshaw, MD;  Location: [facility] OR;  Service: Neurosurgery;  Laterality: Left;   BREAST BIOPSY Left 02/2022   CESAREAN SECTION     times three   COLONOSCOPY  05/12/2011   Procedure: COLONOSCOPY;  Surgeon: Corbin Ade, MD;  Location: [facility];  Service: Endoscopy;  Laterality: N/A;  8:30 AM   DILATION AND CURETTAGE OF UTERUS     ENDOMETRIAL ABLATION     FACIAL FRACTURE SURGERY  08/26/1987   broken nose and cheek bone   HEAD & NECK WOUND REPAIR / CLOSURE     times 5   PR DURAL GRAFT SPINAL Left 06/09/2023   Procedure: Left Stereotactic BRAIN BIOPSY;  Surgeon: Lisbeth Renshaw, MD;  Location: [facility] OR;  Service: Neurosurgery;  Laterality: Left;   ROBOTIC ASSISTED TOTAL HYSTERECTOMY WITH BILATERAL SALPINGO OOPHERECTOMY Bilateral 03/05/2015   Procedure: ROBOTIC ASSISTED TOTAL HYSTERECTOMY WITH BILATERAL SALPINGO OOPHORECTOMY;  Surgeon: Olivia Mackie, MD;  Location: [facility] ORS;  Service: Gynecology;  Laterality: Bilateral;   SINOSCOPY     SKIN GRAFT Right    Eardrum   TONSILLECTOMY AND ADENOIDECTOMY     TYMPANOPLASTY  [id number],1324   TYMPANOSTOMY TUBE PLACEMENT  FAMILY HISTORY:  Family History  Problem Relation Age of Onset   Hypertension Mother    Diabetes Mother    Heart attack Father    Hyperlipidemia Father    Ovarian cancer Maternal Grandmother      SOCIAL HISTORY:  reports that she has never smoked. She has never used smokeless tobacco. She reports current alcohol use of about 3.0 standard drinks of alcohol per week. She reports that she does not use drugs.   ALLERGIES: Codeine, Vicodin [hydrocodone-acetaminophen], Crestor [rosuvastatin calcium], Lipitor [atorvastatin], Pravachol [pravastatin sodium], Singulair [montelukast sodium], Augmentin [amoxicillin-pot  clavulanate], Imipramine, and Topamax [topiramate]   MEDICATIONS:  Current Outpatient Medications  Medication Sig Dispense Refill   albuterol (VENTOLIN HFA) 108 (90 Base) MCG/ACT inhaler inhale 2 puffs every 4-6 hours as needed for cough, wheeze, tightness in chest, shortness of breath 1 each 1   azelastine (ASTELIN) 0.1 % nasal spray 2 sprays per nostril 1-2 times daily as needed. 30 mL 5   baclofen (LIORESAL) 10 MG tablet Take 1 tablet (10 mg total) by mouth 3 (three) times daily as needed. for muscle spams 90 tablet 5   dexamethasone (DECADRON) 4 MG tablet Take 1 tablet (4 mg total) by mouth daily.     docusate sodium (COLACE) 50 MG capsule Take 50 mg by mouth daily.     EPINEPHRINE 0.3 mg/0.3 mL IJ SOAJ injection INJECT 0.3 MG INTO THE MUSCLE ONCE FOR 1 DOSE 2 each 0   estradiol (ESTRACE) 0.1 MG/GM vaginal cream Use twice a week as directed prn 42.5 g 5   Evolocumab (REPATHA SURECLICK) 140 MG/ML SOAJ Inject 140 mg into the skin every 14 (fourteen) days. 2 mL 5   ezetimibe (ZETIA) 10 MG tablet Take 1 tablet by mouth once daily 90 tablet 1   famotidine (PEPCID) 40 MG tablet Take 1 tablet (40 mg total) by mouth at bedtime. 90 tablet 3   fexofenadine (ALLEGRA) 180 MG tablet Take 180 mg by mouth daily as needed for allergies.     guaiFENesin (MUCINEX) 600 MG 12 hr tablet Take 600 mg by mouth 2 (two) times daily.     naproxen sodium (ANAPROX) 220 MG tablet Take 440 mg by mouth daily as needed (headache).     [START ON 06/23/2023] ondansetron (ZOFRAN) 8 MG tablet Take 1 tablet (8 mg total) by mouth every 8 (eight) hours as needed for nausea or vomiting. May take 30-60 minutes prior to Temodar administration if nausea/vomiting occurs 30 tablet 1   Rimegepant Sulfate (NURTEC) 75 MG TBDP Take 1 tablet (75 mg total) by mouth every other day. 15 tablet 0   SUMAtriptan (IMITREX) 100 MG tablet TAKE 1 TABLET BY MOUTH AT THE 1ST SIGN OF A MIGRAINE. MAY REPEAT IN 2 HOURS, MAX 2 TABLETS PER 24 HOURS (MAY FILL  EVERY 25 DAYS) 12 tablet 3   temozolomide (TEMODAR) 100 MG capsule Take 1 capsule (100 mg total) by mouth daily. (Take with 1 (one) 20 mg capsule for total daily dose of 120mg ). May take on an empty stomach to decrease nausea & vomiting. 42 capsule 0   temozolomide (TEMODAR) 20 MG capsule Take 1 capsule (20 mg total) by mouth daily. (Take with 1 (one) 100 mg capsule for total daily dose of 120mg ). May take on an empty stomach to decrease nausea & vomiting. 42 capsule 0   No current facility-administered medications for this encounter.     REVIEW OF SYSTEMS: On review of systems, the patient reports that she is doing  well overall. She endorses right arm and leg weakness. She denies any headache, changes in her vision, nausea, numbness, or tingling.     PHYSICAL EXAM:  Wt Readings from Last 3 Encounters:  06/17/23 152 lb (68.9 kg)  06/16/23 152 lb 4.8 oz (69.1 kg)  06/09/23 155 lb (70.3 kg)   Temp Readings from Last 3 Encounters:  06/17/23 98.2 F (36.8 C)  06/16/23 (!) 97.5 F (36.4 C)  06/10/23 98.6 F (37 C) (Oral)   BP Readings from Last 3 Encounters:  06/17/23 123/73  06/16/23 129/89  06/10/23 118/62   Pulse Readings from Last 3 Encounters:  06/17/23 69  06/16/23 63  06/10/23 62   Pain Assessment Pain Score: 0-No pain/10  In general this is a well appearing female in no acute distress. She's alert and oriented x4 and appropriate throughout the examination. Cardiopulmonary assessment is negative for acute distress and she exhibits normal effort. Decreased strength in the left extremities. Sensation grossly intact throughout. Biopsy lesion appears to be healing with with no signs of delayed wound healing or infection.     ECOG = 3  0 - Asymptomatic (Fully active, able to carry on all predisease activities without restriction)  1 - Symptomatic but completely ambulatory (Restricted in physically strenuous activity but ambulatory and able to carry out work of a light or  sedentary nature. For example, light housework, office work)  2 - Symptomatic, <50% in bed during the day (Ambulatory and capable of all self care but unable to carry out any work activities. Up and about more than 50% of waking hours)  3 - Symptomatic, >50% in bed, but not bedbound (Capable of only limited self-care, confined to bed or chair 50% or more of waking hours)  4 - Bedbound (Completely disabled. Cannot carry on any self-care. Totally confined to bed or chair)  5 - Death   Santiago Glad MM, Creech RH, Tormey DC, et al. [phone]). "Toxicity and response criteria of the [facility]". Am. Evlyn Clines. Oncol. 5 (6): 649-55    LABORATORY DATA:  Lab Results  Component Value Date   WBC 16.8 (H) 06/09/2023   HGB 14.8 06/09/2023   HCT 46.2 (H) 06/09/2023   MCV 91.8 06/09/2023   PLT 207 06/09/2023   Lab Results  Component Value Date   NA 138 06/09/2023   K 4.3 06/09/2023   CL 103 06/09/2023   CO2 24 06/09/2023   Lab Results  Component Value Date   ALT 11 03/04/2023   AST 15 03/04/2023   ALKPHOS 64 03/04/2023   BILITOT 0.3 03/04/2023      RADIOGRAPHY: MR BRAIN W WO CONTRAST  Result Date: 06/05/2023 CLINICAL DATA:  Malignant neoplasm of frontal lobe. Stereotactic radiosurgery planning. EXAM: MRI HEAD WITHOUT AND WITH CONTRAST TECHNIQUE: Multiplanar, multiecho pulse sequences of the brain and surrounding structures were obtained without and with intravenous contrast. CONTRAST:  7mL GADAVIST GADOBUTROL 1 MMOL/ML IV SOLN COMPARISON:  None Available. FINDINGS: Brain: Findings consistent with multifocal high-grade glioma. The dominant necrotic component in the posterior left frontal lobe measures up to 3.0 x 1.9 cm (axial image 228 series 1100). The dominant enhancing component measures up to 46 x 42 mm (image 219 series 1100) with extension across the midline via the corpus callosum and associated ependymal enhancement along the body of the left lateral ventricle and septum  pellucidum (axial image 209 series 1100). Multiple satellite nodules extending anteriorly and inferiorly along the left frontal operculum and insula (for example, images  173 through 185 series 1100). Extensive T2 hyperintense signal abnormality throughout nearly the entire left frontal lobe with extension along the left basal ganglia, internal capsule and thalamus into the left pons (axial image 17 series 4) and along the left insula into the anterior left temporal lobe (axial image 20 series 4), suspicious for additional sites of nonenhancing tumor. Associated 10 mm of rightward midline shift. No acute infarct or hemorrhage. No hydrocephalus or extra-axial collection. Vascular: Normal flow voids and vessel enhancement. Skull and upper cervical spine: Normal marrow signal and enhancement. Sinuses/Orbits: Negative. Other: None. IMPRESSION: Findings consistent with multifocal high-grade glioma centered within the left frontal lobe, as described above. Associated 10 mm of rightward midline shift. Electronically Signed   By: Orvan Falconer M.D.   On: 06/05/2023 15:26   MR Brain W Wo Contrast  Result Date: 05/27/2023 CLINICAL DATA:  Right-sided weakness.  Ataxia.  Frequent falling. EXAM: MRI HEAD WITHOUT AND WITH CONTRAST TECHNIQUE: Multiplanar, multiecho pulse sequences of the brain and surrounding structures were obtained without and with intravenous contrast. CONTRAST:  7mL GADAVIST GADOBUTROL 1 MMOL/ML IV SOLN COMPARISON:  None Available. FINDINGS: Brain: There is an aggressive infiltrating mass lesion with its epicenter in the left hemisphere consistent with glioblastoma multiform. Mass extends extensively through the left frontal lobe with crossing of the corpus callosum into the deep white matter of the right hemisphere. Extensive tumor is present in the insular region and left temporal tip. Tumor extends within the basal ganglia region and upper thalamus, through the left midbrain to the left pons. After  contrast administration, there are multiple foci of abnormal enhancement. Index focus adjacent to the lateral body of the left lateral ventricle measures 1.6 x 1.1 x 1.7 cm. There are a few areas of necrosis. There are a few areas of punctate hemosiderin deposition but no frank hematoma. There is mass effect with left-to-right shift of 8 mm. No hydrocephalus. No extra-axial collection. Vascular: Major vessels at the base of the brain show flow. Skull and upper cervical spine: Negative Sinuses/Orbits: Clear presently. Previous functional endoscopic sinus surgery. Other: None IMPRESSION: Aggressive infiltrating mass lesion with its epicenter in the left hemisphere consistent with glioblastoma multiform. Extensive tumor in the left frontal lobe with crossing of the corpus callosum into the deep white matter of the right hemisphere. Extensive tumor in the insular region and left temporal tip. Tumor extends within the left basal ganglia region and upper thalamus, through the left midbrain to the left pons. Multiple foci of abnormal enhancement. Index focus adjacent to the lateral body of the left lateral ventricle measures 1.6 x 1.1 x 1.7 cm. There are a few areas of necrosis. There is mass effect with left-to-right shift of 8 mm. These results were called by telephone at the time of interpretation on 05/27/2023 at 1:59 pm to provider [facility] office , who verbally acknowledged these results. Electronically Signed   By: Paulina Fusi M.D.   On: 05/27/2023 14:00       IMPRESSION/PLAN: 1. 62 yo female with a glioblastoma of the frontal lobe, WHO grade 4  We reviewed the final pathology and patient's most recent imaging today. Dr. Mitzi Hansen is in agreement with concurrent chemoradiation to the visualized brain disease. Today, we talked to the patient and family about the findings and work-up thus far.  We discussed the natural history of glioblastomas and general treatment, highlighting the role of radiotherapy in the  management.  We discussed the available radiation techniques, and focused on the  details of logistics and delivery.  We reviewed the anticipated acute and late sequelae associated with radiation in this setting.  The patient was encouraged to ask questions that I answered to the best of my ability. A patient consent form was discussed and signed.  We retained a copy for our records.  The patient would like to proceed with radiation.   She is scheduled for CT simulation with contrast on 06/18/2023 at 9 am. Anticipate 6 weeks of radiation treatment. We have shared these plans with Dr. Barbaraann Cao.   In a visit lasting 60 minutes, greater than 50% of the time was spent face to face discussing the patient's condition, in preparation for the discussion, and coordinating the patient's care.   The above documentation reflects my direct findings during this shared patient visit. Please see the separate note by Dr. Mitzi Hansen on this date for the remainder of the patient's plan of care.    Joyice Faster, PA-C    **Disclaimer: This note was dictated with voice recognition software. Similar sounding words can inadvertently be transcribed and this note may contain transcription errors which may not have been corrected upon publication of note.**

## 2023-06-17 NOTE — Progress Notes (Signed)
Location/Histology of Brain Tumor: Left Frontal Glioma    Past or anticipated interventions, if any, per neurosurgery:   Past or anticipated interventions, if any, per medical oncology:   Dose of Decadron, if applicable: Decadron 2 mg daily, started today.   Recent neurologic symptoms, if any:  Seizures: No Headaches: No Nausea: Denies. Dizziness/ataxia: Denies dizziness. Difficulty with hand coordination: Right hand Focal numbness/weakness: Right side weakness.  Denies numbness. Visual deficits/changes: Denies. Confusion/Memory deficits: None   SAFETY ISSUES: Prior radiation? No Pacemaker/ICD? No Possible current pregnancy? Hysterectomy Is the patient on methotrexate? No  Additional Complaints / other details:

## 2023-06-17 NOTE — Telephone Encounter (Signed)
Left a voicemail to let the patient's husband know we would need them to arrive for IV start at [facility] 06/18/2023.  Call back number left [phone]).  Lind Covert RN, BSN

## 2023-06-18 ENCOUNTER — Other Ambulatory Visit: Payer: Self-pay | Admitting: Pharmacy Technician

## 2023-06-18 ENCOUNTER — Ambulatory Visit
Admission: RE | Admit: 2023-06-18 | Discharge: 2023-06-18 | Disposition: A | Discharge status: Home / Self Care | Payer: BC Managed Care – PPO | Source: Ambulatory Visit | Attending: Radiation Oncology | Admitting: Radiation Oncology

## 2023-06-18 ENCOUNTER — Inpatient Hospital Stay: Payer: BC Managed Care – PPO | Admitting: Licensed Clinical Social Worker

## 2023-06-18 ENCOUNTER — Other Ambulatory Visit (HOSPITAL_COMMUNITY): Payer: Self-pay

## 2023-06-18 VITALS — BP 117/73 | HR 63 | Temp 98.3°F | Resp 17 | Wt 198.2 lb

## 2023-06-18 DIAGNOSIS — C711 Malignant neoplasm of frontal lobe: Secondary | ICD-10-CM | POA: Insufficient documentation

## 2023-06-18 DIAGNOSIS — G9389 Other specified disorders of brain: Secondary | ICD-10-CM

## 2023-06-18 DIAGNOSIS — Z510 Encounter for antineoplastic radiation therapy: Secondary | ICD-10-CM | POA: Diagnosis present

## 2023-06-18 DIAGNOSIS — C719 Malignant neoplasm of brain, unspecified: Secondary | ICD-10-CM

## 2023-06-18 NOTE — Telephone Encounter (Signed)
Oral Chemotherapy Pharmacist Encounter  I spoke with patient for overview of: Temodar (temozolomide) for the treatment of glioblastoma in conjunction with radiation, planned duration of temozolomide is 42 days.   Counseled patient on administration, dosing, side effects, monitoring, drug-food interactions, safe handling, storage, and disposal.  Patient will take Temodar 100mg  capsules and Temodar 20mg  capsules, 120 mg total daily dose, by mouth once daily, may take at bedtime and on an empty stomach to decrease nausea and vomiting.  Patient will take Temodar concurrent with radiation for 42 days straight.  Temodar start date: 06/23/23 PM Radiation start date: 06/24/23  Adverse effects include but are not limited to: nausea, vomiting, GI upset, rash, and fatigue. Nausea/Vomiting: patient has antiemetic available (Zofran) and knows to utilize if she experiences N/V while on temozolomide. We also discussed that if she does experience N/V while on temozolomide, she can dose the Zofran 30-60 minutes prior to her TMZ dose to help decrease N/V.   Reviewed with patient importance of keeping a medication schedule and plan for any missed doses. No barriers to medication adherence identified.  Medication reconciliation performed and medication/allergy list updated.  All questions answered.  Ms. Jakab voiced understanding and appreciation.   Medication education handout placed in mail for patient. Patient knows to call the office with questions or concerns. Oral Chemotherapy Clinic phone number provided to patient.   Lenord Carbo, PharmD, BCPS, [facility] Hematology/Oncology Clinical Pharmacist [facility] Oral Chemotherapy Navigation Clinics [phone] 06/18/2023 12:35 PM

## 2023-06-18 NOTE — Progress Notes (Signed)
Specialty Pharmacy Initial Fill Coordination Note  Joyce Jacobs is a 62 y.o. female contacted today regarding refills of specialty medication(s) Temozolomide .  Patient requested Delivery  on 06/22/23  to verified address [address]   [place] [address]   Medication will be filled on 06/18/23.   Patient is aware of $200 copayment.

## 2023-06-18 NOTE — Progress Notes (Signed)
[facility] Clinical Social Work  Initial Assessment   Joyce Jacobs is a 62 y.o. year old female contacted by phone. Clinical Social Work was referred by medical provider for assessment of psychosocial needs.   SDOH (Social Determinants of Health) assessments performed: Yes   SDOH Screenings   Food Insecurity: No Food Insecurity (04/02/2023)  Housing: Low Risk  (04/02/2023)  Transportation Needs: No Transportation Needs (04/02/2023)  Alcohol Screen: Low Risk  (04/02/2023)  Depression (PHQ2-9): Low Risk  (04/06/2023)  Financial Resource Strain: Low Risk  (04/02/2023)  Physical Activity: Insufficiently Active (04/02/2023)  Social Connections: Moderately Isolated (04/02/2023)  Stress: No Stress Concern Present (04/02/2023)  Tobacco Use: Low Risk  (06/09/2023)     Distress Screen completed: No     No data to display            Family/Social Information:  Housing Arrangement: patient lives with her husband and their 13 y/o son.  Family members/support persons in your life? Pt reports her husband has a flexible schedule with his work and is able to check on pt multiple times during the day.  Pt's spouse will be taking time off from work during radiation to ensure pt is able to go to all of her appointments. Transportation concerns: no  Employment: Retired .  Income source: Actor concerns: No Type of concern: None Food access concerns: no Religious or spiritual practice: Yes-Christian Services Currently in place:  none  Coping/ Adjustment to diagnosis: Patient understands treatment plan and what happens next? yes Concerns about diagnosis and/or treatment: Overwhelmed by information, How will I care for myself, and Quality of life Patient reported stressors: Anxiety/ nervousness, Adjusting to my illness, Facing my mortality, and Physical issues Hopes and/or priorities: Pt's priority is to start treatment w/ the hope of positive results Patient enjoys  not  addressed Current coping skills/ strengths: Motivation for treatment/growth  and Supportive family/friends     SUMMARY: Current SDOH Barriers:  Inability to perform ADL's independently  Clinical Social Work Clinical Goal(s):  No clinical social work goals at this time  Interventions: Discussed common feeling and emotions when being diagnosed with cancer, and the importance of support during treatment Informed patient of the support team roles and support services at [facility] Provided CSW contact information and encouraged patient to call with any questions or concerns Pt is experiencing rapidly progressing symptoms which have significantly impacted her quality of life.  Pt is experiencing right sided paralysis and is unable to use her right arm.  Pt is left handed.  Pt utilizes a walker and wheelchair for ambulation which has occurred within a 2 month time frame.  Pt reporting no needs at this time, but is aware of CSW availability for counseling and emotional support.   Follow Up Plan: Patient will contact CSW with any support or resource needs Patient verbalizes understanding of plan: Yes    Rachel Moulds, LCSW Clinical Social Worker [facility]

## 2023-06-18 NOTE — Progress Notes (Signed)
Oral Chemotherapy Pharmacist Encounter  Patient was counseled under telephone encounter from 06/16/23. Patient counseled via telephone on 06/18/23.  Lenord Carbo, PharmD, BCPS, BCOP Hematology/Oncology Clinical Pharmacist [facility] Oral Chemotherapy Navigation Clinics [phone] 06/18/2023 12:40 PM

## 2023-06-20 ENCOUNTER — Other Ambulatory Visit: Payer: Self-pay

## 2023-06-23 ENCOUNTER — Encounter (HOSPITAL_COMMUNITY): Payer: Self-pay

## 2023-06-23 ENCOUNTER — Telehealth: Payer: Self-pay | Admitting: Internal Medicine

## 2023-06-23 DIAGNOSIS — Z510 Encounter for antineoplastic radiation therapy: Secondary | ICD-10-CM | POA: Diagnosis not present

## 2023-06-23 LAB — SURGICAL PATHOLOGY

## 2023-06-23 NOTE — Telephone Encounter (Signed)
Spoke with patient confirming upcoming appointment  

## 2023-06-24 ENCOUNTER — Other Ambulatory Visit: Payer: Self-pay

## 2023-06-24 ENCOUNTER — Ambulatory Visit
Admission: RE | Admit: 2023-06-24 | Discharge: 2023-06-24 | Disposition: A | Discharge status: Home / Self Care | Payer: BC Managed Care – PPO | Source: Ambulatory Visit | Attending: Radiation Oncology | Admitting: Radiation Oncology

## 2023-06-24 DIAGNOSIS — Z510 Encounter for antineoplastic radiation therapy: Secondary | ICD-10-CM | POA: Diagnosis not present

## 2023-06-24 LAB — RAD ONC ARIA SESSION SUMMARY
Course Elapsed Days: 0
Plan Fractions Treated to Date: 1
Plan Prescribed Dose Per Fraction: 2 Gy
Plan Total Fractions Prescribed: 23
Plan Total Prescribed Dose: 46 Gy
Reference Point Dosage Given to Date: 2 Gy
Reference Point Session Dosage Given: 2 Gy
Session Number: 1

## 2023-06-25 ENCOUNTER — Ambulatory Visit
Admission: RE | Admit: 2023-06-25 | Discharge: 2023-06-25 | Disposition: A | Discharge status: Home / Self Care | Payer: BC Managed Care – PPO | Source: Ambulatory Visit | Attending: Radiation Oncology | Admitting: Radiation Oncology

## 2023-06-25 ENCOUNTER — Other Ambulatory Visit: Payer: Self-pay

## 2023-06-25 DIAGNOSIS — Z510 Encounter for antineoplastic radiation therapy: Secondary | ICD-10-CM | POA: Diagnosis not present

## 2023-06-25 LAB — RAD ONC ARIA SESSION SUMMARY
Course Elapsed Days: 1
Plan Fractions Treated to Date: 2
Plan Prescribed Dose Per Fraction: 2 Gy
Plan Total Fractions Prescribed: 23
Plan Total Prescribed Dose: 46 Gy
Reference Point Dosage Given to Date: 4 Gy
Reference Point Session Dosage Given: 2 Gy
Session Number: 2

## 2023-06-26 ENCOUNTER — Ambulatory Visit
Admission: RE | Admit: 2023-06-26 | Discharge: 2023-06-26 | Disposition: A | Discharge status: Home / Self Care | Payer: BC Managed Care – PPO | Source: Ambulatory Visit | Attending: Radiation Oncology | Admitting: Radiation Oncology

## 2023-06-26 ENCOUNTER — Other Ambulatory Visit: Payer: Self-pay

## 2023-06-26 DIAGNOSIS — Z510 Encounter for antineoplastic radiation therapy: Secondary | ICD-10-CM | POA: Insufficient documentation

## 2023-06-26 DIAGNOSIS — C711 Malignant neoplasm of frontal lobe: Secondary | ICD-10-CM | POA: Insufficient documentation

## 2023-06-26 DIAGNOSIS — G9389 Other specified disorders of brain: Secondary | ICD-10-CM

## 2023-06-26 LAB — RAD ONC ARIA SESSION SUMMARY
Course Elapsed Days: 2
Plan Fractions Treated to Date: 3
Plan Prescribed Dose Per Fraction: 2 Gy
Plan Total Fractions Prescribed: 23
Plan Total Prescribed Dose: 46 Gy
Reference Point Dosage Given to Date: 6 Gy
Reference Point Session Dosage Given: 2 Gy
Session Number: 3

## 2023-06-26 MED ORDER — SONAFINE EX EMUL
1.0000 | Freq: Two times a day (BID) | CUTANEOUS | Status: DC
  Administered 2023-06-26: 1 via TOPICAL

## 2023-06-28 ENCOUNTER — Other Ambulatory Visit: Payer: Self-pay

## 2023-06-29 ENCOUNTER — Ambulatory Visit
Admission: RE | Admit: 2023-06-29 | Discharge: 2023-06-29 | Disposition: A | Discharge status: Home / Self Care | Payer: BC Managed Care – PPO | Source: Ambulatory Visit | Attending: Radiation Oncology | Admitting: Radiation Oncology

## 2023-06-29 ENCOUNTER — Other Ambulatory Visit: Payer: Self-pay

## 2023-06-29 DIAGNOSIS — C711 Malignant neoplasm of frontal lobe: Secondary | ICD-10-CM | POA: Diagnosis not present

## 2023-06-29 LAB — RAD ONC ARIA SESSION SUMMARY
Course Elapsed Days: 5
Plan Fractions Treated to Date: 4
Plan Prescribed Dose Per Fraction: 2 Gy
Plan Total Fractions Prescribed: 23
Plan Total Prescribed Dose: 46 Gy
Reference Point Dosage Given to Date: 8 Gy
Reference Point Session Dosage Given: 2 Gy
Session Number: 4

## 2023-06-30 ENCOUNTER — Other Ambulatory Visit: Payer: Self-pay

## 2023-06-30 ENCOUNTER — Ambulatory Visit
Admission: RE | Admit: 2023-06-30 | Discharge: 2023-06-30 | Disposition: A | Discharge status: Home / Self Care | Payer: BC Managed Care – PPO | Source: Ambulatory Visit | Attending: Radiation Oncology

## 2023-06-30 DIAGNOSIS — C711 Malignant neoplasm of frontal lobe: Secondary | ICD-10-CM | POA: Diagnosis not present

## 2023-06-30 LAB — RAD ONC ARIA SESSION SUMMARY
Course Elapsed Days: 6
Plan Fractions Treated to Date: 5
Plan Prescribed Dose Per Fraction: 2 Gy
Plan Total Fractions Prescribed: 23
Plan Total Prescribed Dose: 46 Gy
Reference Point Dosage Given to Date: 10 Gy
Reference Point Session Dosage Given: 2 Gy
Session Number: 5

## 2023-07-01 ENCOUNTER — Other Ambulatory Visit: Payer: Self-pay

## 2023-07-01 ENCOUNTER — Ambulatory Visit
Admission: RE | Admit: 2023-07-01 | Discharge: 2023-07-01 | Disposition: A | Discharge status: Home / Self Care | Payer: BC Managed Care – PPO | Source: Ambulatory Visit | Attending: Radiation Oncology

## 2023-07-01 DIAGNOSIS — C711 Malignant neoplasm of frontal lobe: Secondary | ICD-10-CM | POA: Diagnosis not present

## 2023-07-01 LAB — RAD ONC ARIA SESSION SUMMARY
Course Elapsed Days: 7
Plan Fractions Treated to Date: 6
Plan Prescribed Dose Per Fraction: 2 Gy
Plan Total Fractions Prescribed: 23
Plan Total Prescribed Dose: 46 Gy
Reference Point Dosage Given to Date: 12 Gy
Reference Point Session Dosage Given: 2 Gy
Session Number: 6

## 2023-07-02 ENCOUNTER — Other Ambulatory Visit: Payer: Self-pay

## 2023-07-02 ENCOUNTER — Ambulatory Visit
Admission: RE | Admit: 2023-07-02 | Discharge: 2023-07-02 | Disposition: A | Discharge status: Home / Self Care | Payer: BC Managed Care – PPO | Source: Ambulatory Visit | Attending: Radiation Oncology | Admitting: Radiation Oncology

## 2023-07-02 DIAGNOSIS — C711 Malignant neoplasm of frontal lobe: Secondary | ICD-10-CM | POA: Diagnosis not present

## 2023-07-02 LAB — RAD ONC ARIA SESSION SUMMARY
Course Elapsed Days: 8
Plan Fractions Treated to Date: 7
Plan Prescribed Dose Per Fraction: 2 Gy
Plan Total Fractions Prescribed: 23
Plan Total Prescribed Dose: 46 Gy
Reference Point Dosage Given to Date: 14 Gy
Reference Point Session Dosage Given: 2 Gy
Session Number: 7

## 2023-07-03 ENCOUNTER — Encounter: Payer: Self-pay | Admitting: Internal Medicine

## 2023-07-03 ENCOUNTER — Other Ambulatory Visit: Payer: Self-pay

## 2023-07-03 ENCOUNTER — Ambulatory Visit
Admission: RE | Admit: 2023-07-03 | Discharge: 2023-07-03 | Disposition: A | Discharge status: Home / Self Care | Payer: BC Managed Care – PPO | Source: Ambulatory Visit | Attending: Radiation Oncology | Admitting: Radiation Oncology

## 2023-07-03 ENCOUNTER — Ambulatory Visit
Admission: RE | Admit: 2023-07-03 | Discharge: 2023-07-03 | Disposition: A | Discharge status: Home / Self Care | Payer: BC Managed Care – PPO | Source: Ambulatory Visit | Attending: Radiation Oncology

## 2023-07-03 DIAGNOSIS — C711 Malignant neoplasm of frontal lobe: Secondary | ICD-10-CM | POA: Diagnosis not present

## 2023-07-03 LAB — RAD ONC ARIA SESSION SUMMARY
Course Elapsed Days: 9
Plan Fractions Treated to Date: 8
Plan Prescribed Dose Per Fraction: 2 Gy
Plan Total Fractions Prescribed: 23
Plan Total Prescribed Dose: 46 Gy
Reference Point Dosage Given to Date: 16 Gy
Reference Point Session Dosage Given: 2 Gy
Session Number: 8

## 2023-07-06 ENCOUNTER — Encounter: Payer: Self-pay | Admitting: Internal Medicine

## 2023-07-06 ENCOUNTER — Other Ambulatory Visit: Payer: Self-pay

## 2023-07-06 ENCOUNTER — Emergency Department (HOSPITAL_COMMUNITY): Payer: BC Managed Care – PPO

## 2023-07-06 ENCOUNTER — Encounter: Payer: Self-pay | Admitting: *Deleted

## 2023-07-06 ENCOUNTER — Ambulatory Visit
Admission: RE | Admit: 2023-07-06 | Discharge: 2023-07-06 | Disposition: A | Discharge status: Home / Self Care | Payer: BC Managed Care – PPO | Source: Ambulatory Visit | Attending: Radiation Oncology | Admitting: Radiation Oncology

## 2023-07-06 ENCOUNTER — Inpatient Hospital Stay: Payer: BC Managed Care – PPO | Attending: Family Medicine | Admitting: Internal Medicine

## 2023-07-06 ENCOUNTER — Encounter (HOSPITAL_COMMUNITY): Payer: Self-pay | Admitting: Internal Medicine

## 2023-07-06 ENCOUNTER — Observation Stay (HOSPITAL_COMMUNITY)
Admission: EM | Admit: 2023-07-06 | Discharge: 2023-07-07 | Disposition: A | Discharge status: Home / Self Care | Payer: BC Managed Care – PPO | Attending: Internal Medicine | Admitting: Internal Medicine

## 2023-07-06 VITALS — BP 115/94 | HR 101 | Temp 97.9°F | Resp 15

## 2023-07-06 DIAGNOSIS — X58XXXA Exposure to other specified factors, initial encounter: Secondary | ICD-10-CM | POA: Diagnosis not present

## 2023-07-06 DIAGNOSIS — Z515 Encounter for palliative care: Secondary | ICD-10-CM

## 2023-07-06 DIAGNOSIS — S06A1XA Traumatic brain compression with herniation, initial encounter: Secondary | ICD-10-CM | POA: Diagnosis not present

## 2023-07-06 DIAGNOSIS — C719 Malignant neoplasm of brain, unspecified: Secondary | ICD-10-CM | POA: Diagnosis not present

## 2023-07-06 DIAGNOSIS — J45909 Unspecified asthma, uncomplicated: Secondary | ICD-10-CM | POA: Insufficient documentation

## 2023-07-06 DIAGNOSIS — Z1152 Encounter for screening for COVID-19: Secondary | ICD-10-CM | POA: Diagnosis not present

## 2023-07-06 DIAGNOSIS — G935 Compression of brain: Secondary | ICD-10-CM

## 2023-07-06 DIAGNOSIS — Z79899 Other long term (current) drug therapy: Secondary | ICD-10-CM | POA: Insufficient documentation

## 2023-07-06 DIAGNOSIS — R400 Somnolence: Secondary | ICD-10-CM | POA: Diagnosis present

## 2023-07-06 DIAGNOSIS — Z789 Other specified health status: Secondary | ICD-10-CM | POA: Diagnosis present

## 2023-07-06 DIAGNOSIS — C711 Malignant neoplasm of frontal lobe: Secondary | ICD-10-CM | POA: Insufficient documentation

## 2023-07-06 LAB — COMPREHENSIVE METABOLIC PANEL
ALT: 23 U/L (ref 0–44)
AST: 17 U/L (ref 15–41)
Albumin: 3.7 g/dL (ref 3.5–5.0)
Alkaline Phosphatase: 124 U/L (ref 38–126)
Anion gap: 12 (ref 5–15)
BUN: 26 mg/dL — ABNORMAL HIGH (ref 8–23)
CO2: 23 mmol/L (ref 22–32)
Calcium: 9.2 mg/dL (ref 8.9–10.3)
Chloride: 106 mmol/L (ref 98–111)
Creatinine, Ser: 0.76 mg/dL (ref 0.44–1.00)
GFR, Estimated: >60 mL/min (ref >60)
Glucose, Bld: 121 mg/dL — ABNORMAL HIGH (ref 70–99)
Potassium: 3.7 mmol/L (ref 3.5–5.1)
Sodium: 141 mmol/L (ref 135–145)
Total Bilirubin: 0.7 mg/dL (ref <1.2)
Total Protein: 6.8 g/dL (ref 6.5–8.1)

## 2023-07-06 LAB — I-STAT CHEM 8, ED
BUN: 25 mg/dL — ABNORMAL HIGH (ref 8–23)
Calcium, Ion: 1.2 mmol/L (ref 1.15–1.40)
Chloride: 108 mmol/L (ref 98–111)
Creatinine, Ser: 0.7 mg/dL (ref 0.44–1.00)
Glucose, Bld: 115 mg/dL — ABNORMAL HIGH (ref 70–99)
HCT: 44 % (ref 36.0–46.0)
Hemoglobin: 15 g/dL (ref 12.0–15.0)
Potassium: 3.8 mmol/L (ref 3.5–5.1)
Sodium: 144 mmol/L (ref 135–145)
TCO2: 23 mmol/L (ref 22–32)

## 2023-07-06 LAB — URINALYSIS, W/ REFLEX TO CULTURE (INFECTION SUSPECTED)
Bilirubin Urine: NEGATIVE
Glucose, UA: NEGATIVE mg/dL
Ketones, ur: 20 mg/dL — AB
Nitrite: POSITIVE — AB
Protein, ur: 30 mg/dL — AB
Specific Gravity, Urine: 1.027 (ref 1.005–1.030)
pH: 6 (ref 5.0–8.0)

## 2023-07-06 LAB — BLOOD GAS, VENOUS
Acid-Base Excess: 1.3 mmol/L (ref 0.0–2.0)
Bicarbonate: 25.9 mmol/L (ref 20.0–28.0)
O2 Saturation: 73.4 %
Patient temperature: 37
pCO2, Ven: 40 mm[Hg] — ABNORMAL LOW (ref 44–60)
pH, Ven: 7.42 (ref 7.25–7.43)
pO2, Ven: 44 mm[Hg] (ref 32–45)

## 2023-07-06 LAB — RAD ONC ARIA SESSION SUMMARY
Course Elapsed Days: 12
Plan Fractions Treated to Date: 9
Plan Prescribed Dose Per Fraction: 2 Gy
Plan Total Fractions Prescribed: 23
Plan Total Prescribed Dose: 46 Gy
Reference Point Dosage Given to Date: 18 Gy
Reference Point Session Dosage Given: 2 Gy
Session Number: 9

## 2023-07-06 LAB — CBC WITH DIFFERENTIAL/PLATELET
Abs Immature Granulocytes: 0.12 10*3/uL — ABNORMAL HIGH (ref 0.00–0.07)
Basophils Absolute: 0 10*3/uL (ref 0.0–0.1)
Basophils Relative: 0 %
Eosinophils Absolute: 0.1 10*3/uL (ref 0.0–0.5)
Eosinophils Relative: 1 %
HCT: 43.2 % (ref 36.0–46.0)
Hemoglobin: 14.3 g/dL (ref 12.0–15.0)
Immature Granulocytes: 1 %
Lymphocytes Relative: 12 %
Lymphs Abs: 1 10*3/uL (ref 0.7–4.0)
MCH: 30.7 pg (ref 26.0–34.0)
MCHC: 33.1 g/dL (ref 30.0–36.0)
MCV: 92.7 fL (ref 80.0–100.0)
Monocytes Absolute: 0.7 10*3/uL (ref 0.1–1.0)
Monocytes Relative: 8 %
Neutro Abs: 6.9 10*3/uL (ref 1.7–7.7)
Neutrophils Relative %: 78 %
Platelets: 223 10*3/uL (ref 150–400)
RBC: 4.66 MIL/uL (ref 3.87–5.11)
RDW: 13.4 % (ref 11.5–15.5)
WBC: 8.9 10*3/uL (ref 4.0–10.5)
nRBC: 0 % (ref 0.0–0.2)

## 2023-07-06 LAB — TROPONIN I (HIGH SENSITIVITY): Troponin I (High Sensitivity): 11 ng/L (ref <18)

## 2023-07-06 LAB — CBG MONITORING, ED: Glucose-Capillary: 111 mg/dL — ABNORMAL HIGH (ref 70–99)

## 2023-07-06 LAB — I-STAT CG4 LACTIC ACID, ED: Lactic Acid, Venous: 1.5 mmol/L (ref 0.5–1.9)

## 2023-07-06 LAB — SARS CORONAVIRUS 2 BY RT PCR: SARS Coronavirus 2 by RT PCR: NEGATIVE

## 2023-07-06 LAB — MAGNESIUM: Magnesium: 2.6 mg/dL — ABNORMAL HIGH (ref 1.7–2.4)

## 2023-07-06 MED ORDER — IBUPROFEN 200 MG PO TABS: 400.0000 mg | ORAL_TABLET | Freq: Four times a day (QID) | ORAL | Status: DC | PRN

## 2023-07-06 MED ORDER — ONDANSETRON HCL 4 MG/2ML IJ SOLN: 4.0000 mg | Freq: Four times a day (QID) | INTRAMUSCULAR | Status: DC | PRN

## 2023-07-06 MED ORDER — LEVETIRACETAM IN NACL 1000 MG/100ML IV SOLN
1000.0000 mg | Freq: Two times a day (BID) | INTRAVENOUS | Status: DC
  Administered 2023-07-06: 1000 mg via INTRAVENOUS
  Filled 2023-07-06: qty 100

## 2023-07-06 MED ORDER — ORAL CARE MOUTH RINSE
15.0000 mL | OROMUCOSAL | Status: DC
  Administered 2023-07-07: 15 mL via OROMUCOSAL

## 2023-07-06 MED ORDER — ORAL CARE MOUTH RINSE: 15.0000 mL | OROMUCOSAL | Status: DC | PRN

## 2023-07-06 MED ORDER — DEXAMETHASONE SODIUM PHOSPHATE 10 MG/ML IJ SOLN
10.0000 mg | Freq: Once | INTRAMUSCULAR | Status: AC
  Administered 2023-07-06: 10 mg via INTRAVENOUS
  Filled 2023-07-06: qty 1

## 2023-07-06 MED ORDER — ONDANSETRON HCL 4 MG PO TABS: 4.0000 mg | ORAL_TABLET | Freq: Four times a day (QID) | ORAL | Status: DC | PRN

## 2023-07-06 MED ORDER — FENTANYL CITRATE PF 50 MCG/ML IJ SOSY: 12.5000 ug | PREFILLED_SYRINGE | INTRAMUSCULAR | Status: DC | PRN

## 2023-07-06 NOTE — ED Provider Notes (Signed)
EMERGENCY DEPARTMENT AT [facility] Provider Note   CSN: [id number] Arrival date & time: 07/06/23  4259     History  Chief Complaint  Patient presents with   Fatigue    Joyce Jacobs is a 62 y.o. female with L frontal glioblastoma, asthma, fibroids, HLD, and idiopathic angioedema who presents with lethargy.  [facility]u appt with Dr. Barbaraann Cao, now s/p 2 weeks of chemo/rad.  History is provided by her husband.  Husband reports that since Friday patient has been sleeping almost constantly, intermittently responsive to verbal stimuli and responding that she loves him, but mostly is unresponsive.  She has not been able to eat or drink and has not been able to take any of her medications including her daily chemotherapy medication since that time.  She has significant right-sided weakness at baseline due to glioblastoma that was diagnosed in September. Her husband reports significant decline in function since late last week.  He has noticed shaking episodes in the left arm and leg, a handful of times in the past week.  Decadron was stopped over the weekend because of no oral intake.  Denies any fever/chills, report of pain, abdominal pain, nausea vomiting, diarrhea. No report of falls/trauma. No medication changes except the decadron/chemo added 2 weeks ago.   Past Medical History:  Diagnosis Date   Allergy    Anemia    last check up HGb was normal   Angio-edema    Asthma    allergy related or with respiratory illiness   Chronic sinusitis    Eczema    GERD (gastroesophageal reflux disease)    Headache(784.0)    migraines   Hyperlipidemia    Pneumonia    x 1   PONV (postoperative nausea and vomiting)    Right sided weakness 05/27/2023   Urticaria        Home Medications Prior to Admission medications   Medication Sig Start Date End Date Taking? Authorizing Provider  albuterol (VENTOLIN HFA) 108 (90 Base) MCG/ACT inhaler  inhale 2 puffs every 4-6 hours as needed for cough, wheeze, tightness in chest, shortness of breath 04/03/21   Nehemiah Settle, FNP  azelastine (ASTELIN) 0.1 % nasal spray 2 sprays per nostril 1-2 times daily as needed. 10/29/22   Alfonse Spruce, MD  baclofen (LIORESAL) 10 MG tablet Take 1 tablet (10 mg total) by mouth 3 (three) times daily as needed. for muscle spams 08/29/22   Babs Sciara, MD  dexamethasone (DECADRON) 4 MG tablet Take 1 tablet (4 mg total) by mouth daily. 06/01/23   Henreitta Leber, MD  docusate sodium (COLACE) 50 MG capsule Take 50 mg by mouth daily.    [provider]  EPINEPHRINE 0.3 mg/0.3 mL IJ SOAJ injection INJECT 0.3 MG INTO THE MUSCLE ONCE FOR 1 DOSE 02/12/22   Alfonse Spruce, MD  estradiol (ESTRACE) 0.1 MG/GM vaginal cream Use twice a week as directed prn 03/03/23   Campbell Riches, NP  Evolocumab (REPATHA SURECLICK) 140 MG/ML SOAJ Inject 140 mg into the skin every 14 (fourteen) days. 03/03/23   Campbell Riches, NP  ezetimibe (ZETIA) 10 MG tablet Take 1 tablet by mouth once daily 01/21/23   Babs Sciara, MD  famotidine (PEPCID) 40 MG tablet Take 1 tablet (40 mg total) by mouth at bedtime. 08/29/22   Babs Sciara, MD  fexofenadine (ALLEGRA) 180 MG tablet Take 180 mg by mouth daily as needed for allergies.  [provider]  guaiFENesin (MUCINEX) 600 MG 12 hr tablet Take 600 mg by mouth 2 (two) times daily.    [provider]  naproxen sodium (ANAPROX) 220 MG tablet Take 440 mg by mouth daily as needed (headache).    [provider]  ondansetron (ZOFRAN) 8 MG tablet Take 1 tablet (8 mg total) by mouth every 8 (eight) hours as needed for nausea or vomiting. May take 30-60 minutes prior to Temodar administration if nausea/vomiting occurs 06/23/23   Vaslow, Georgeanna Lea, MD  Rimegepant Sulfate (NURTEC) 75 MG TBDP Take 1 tablet (75 mg total) by mouth every other day. 11/04/22   Tommie Sams, DO  SUMAtriptan (IMITREX) 100 MG  tablet TAKE 1 TABLET BY MOUTH AT THE 1ST SIGN OF A MIGRAINE. MAY REPEAT IN 2 HOURS, MAX 2 TABLETS PER 24 HOURS (MAY FILL EVERY 25 DAYS) 08/29/22   Babs Sciara, MD  temozolomide (TEMODAR) 100 MG capsule Take 1 capsule (100 mg total) by mouth daily. (Take with 1 (one) 20 mg capsule for total daily dose of 120mg ). May take on an empty stomach to decrease nausea & vomiting. 06/16/23   Henreitta Leber, MD  temozolomide (TEMODAR) 20 MG capsule Take 1 capsule (20 mg total) by mouth daily. (Take with 1 (one) 100 mg capsule for total daily dose of 120mg ). May take on an empty stomach to decrease nausea & vomiting. 06/16/23   Henreitta Leber, MD      Allergies    Codeine, Vicodin [hydrocodone-acetaminophen], Crestor [rosuvastatin calcium], Lipitor [atorvastatin], Pravachol [pravastatin sodium], Singulair [montelukast sodium], Augmentin [amoxicillin-pot clavulanate], Imipramine, and Topamax [topiramate]    Review of Systems   Review of Systems A 10 point review of systems was performed and is negative unless otherwise reported in HPI.  Physical Exam Updated Vital Signs BP 115/70 (BP Location: Left Arm)   Pulse 74   Temp 97.6 F (36.4 C) (Axillary)   Resp 15   Ht 5\' 3"  (1.6 m)   Wt 70.3 kg   LMP 03/05/2015   SpO2 94%   BMI 27.46 kg/m  Physical Exam General: Somnolent appearing female, lying in bed.  HEENT: PERRLA midrange, doll's eye reflex intact, Sclera anicteric, dry mucous  membranes, trachea midline.  Cardiology: RRR, no murmurs/rubs/gallops. BL radial and DP pulses equal bilaterally.  Resp: Normal respiratory rate and effort. CTAB, no wheezes, rhonchi, crackles.  Abd: Soft, non-tender, non-distended. No rebound tenderness or guarding.  GU: Deferred. MSK: No peripheral edema or signs of trauma. Extremities without deformity or TTP. No cyanosis or clubbing. Skin: warm, dry.  Neuro: Somnolent, unresponsive to verbal stimuli, crosses midline to painful stimuli on both sides but no  movement in RLE which husband states is not abnormal; Otherwise 3/5 strength in LUE/LLE, and 2/5 strength in RUE. Responds to painful stimulin in all four extremities.  ED Results / Procedures / Treatments   Labs (all labs ordered are listed, but only abnormal results are displayed) Labs Reviewed  CBC WITH DIFFERENTIAL/PLATELET - Abnormal; Notable for the following components:      Result Value   Abs Immature Granulocytes 0.12 (*)    All other components within normal limits  COMPREHENSIVE METABOLIC PANEL - Abnormal; Notable for the following components:   Glucose, Bld 121 (*)    BUN 26 (*)    All other components within normal limits  MAGNESIUM - Abnormal; Notable for the following components:   Magnesium 2.6 (*)    All other components within normal limits  URINALYSIS, W/ REFLEX TO CULTURE (INFECTION SUSPECTED) - Abnormal; Notable for the following components:   Color, Urine AMBER (*)    APPearance CLOUDY (*)    Hgb urine dipstick SMALL (*)    Ketones, ur 20 (*)    Protein, ur 30 (*)    Nitrite POSITIVE (*)    Leukocytes,Ua TRACE (*)    Bacteria, UA RARE (*)    All other components within normal limits  BLOOD GAS, VENOUS - Abnormal; Notable for the following components:   pCO2, Ven 40 (*)    All other components within normal limits  CBG MONITORING, ED - Abnormal; Notable for the following components:   Glucose-Capillary 111 (*)    All other components within normal limits  I-STAT CHEM 8, ED - Abnormal; Notable for the following components:   BUN 25 (*)    Glucose, Bld 115 (*)    All other components within normal limits  SARS CORONAVIRUS 2 BY RT PCR  URINE CULTURE  HIV ANTIBODY (ROUTINE TESTING W REFLEX)  I-STAT CG4 LACTIC ACID, ED  I-STAT VENOUS BLOOD GAS, ED  TROPONIN I (HIGH SENSITIVITY)    EKG EKG Interpretation Date/Time:  Monday July 06 2023 09:34:12 EST Ventricular Rate:  77 PR Interval:  131 QRS Duration:  89 QT Interval:  407 QTC Calculation: 461 R  Axis:   18  Text Interpretation: Sinus rhythm RSR' in V1 or V2, probably normal variant Confirmed by Vivi Barrack [phone]) on 07/06/2023 9:45:05 AM  Radiology CT Head Wo Contrast  Result Date: 07/06/2023 CLINICAL DATA:  Mental status change, unknown cause Head/neck cancer, monitor. EXAM: CT HEAD WITHOUT CONTRAST TECHNIQUE: Contiguous axial images were obtained from the base of the skull through the vertex without intravenous contrast. RADIATION DOSE REDUCTION: This exam was performed according to the departmental dose-optimization program which includes automated exposure control, adjustment of the mA and/or kV according to patient size and/or use of iterative reconstruction technique. COMPARISON:  MRI brain 06/05/2023. FINDINGS: Brain: Interval increase in size of the heterogeneous, predominantly solid mass centered within the left corona radiata with extension across the corpus callosum associated involvement left basal ganglia and insula. New focus of acute hemorrhage along the inferior posterior margin of the mass, measuring up to 15 mm (axial image 18 series 2). Increased leftward midline shift, now measuring up to 19 mm, previously 10 mm. Near-complete effacement of the left lateral ventricle and third ventricle. Slightly increased size of the right lateral ventricle with mild periventricular hypoattenuation, likely reflecting transependymal resorption of CSF and suspicious for entrapment. Associated left uncal herniation. Vascular: No hyperdense vessel or unexpected calcification. Skull: Left frontal burr hole. No calvarial fracture. Skull base is unremarkable. Sinuses/Orbits: Chronic right maxillary sinusitis with evidence of prior sinus surgery. Orbits are unremarkable. Other: None. IMPRESSION: 1. Interval increase in size of the heterogeneous, predominantly solid mass centered within the left corona radiata with extension across the corpus callosum and associated involvement of the left basal  ganglia and insula. New focus of acute hemorrhage along the inferior posterior margin of the mass, measuring up to 15 mm. 2. Increased leftward midline shift, now measuring up to 19 mm, previously 10 mm. 3. Near-complete effacement of the left lateral ventricle and third ventricle. Slightly increased size of the right lateral ventricle with mild periventricular hypoattenuation, likely reflecting transependymal resorption of CSF and suspicious for entrapment. Associated left uncal herniation. These results were called by telephone at the time of interpretation on 07/06/2023 at 11:30 am to provider Sulema Braid ,  who verbally acknowledged these results. Electronically Signed   By: Orvan Falconer M.D.   On: 07/06/2023 11:30    Procedures .Critical Care  Performed by: Loetta Rough, MD Authorized by: Loetta Rough, MD   Critical care provider statement:    Critical care time (minutes):  50   Critical care was necessary to treat or prevent imminent or life-threatening deterioration of the following conditions:  CNS failure or compromise   Critical care was time spent personally by me on the following activities:  Development of treatment plan with patient or surrogate, discussions with consultants, evaluation of patient's response to treatment, examination of patient, ordering and review of laboratory studies, ordering and review of radiographic studies, ordering and performing treatments and interventions, pulse oximetry, re-evaluation of patient's condition, review of old charts and obtaining history from patient or surrogate   Care discussed with: admitting provider       Medications Ordered in ED Medications  ibuprofen (ADVIL) tablet 400 mg (has no administration in time range)  fentaNYL (SUBLIMAZE) injection 12.5-50 mcg (has no administration in time range)  ondansetron (ZOFRAN) tablet 4 mg (has no administration in time range)    Or  ondansetron (ZOFRAN) injection 4 mg (has no  administration in time range)  dexamethasone (DECADRON) injection 10 mg (10 mg Intravenous Given 07/06/23 1044)    ED Course/ Medical Decision Making/ A&P                          Medical Decision Making Amount and/or Complexity of Data Reviewed Labs: ordered. Radiology: ordered.  Risk Prescription drug management. Decision regarding hospitalization.    This patient presents to the ED for concern of AMS, this involves an extensive number of treatment options, and is a complaint that carries with it a high risk of complications and morbidity.  I considered the following differential and admission for this acute, potentially life threatening condition.   MDM:    Ddx of acute altered mental status or encephalopathy considered but not limited to: -Intracranial abnormalities such as ICH, hydrocephalus  -Infection such as UTI, PNA  -Toxic ingestion such as polypharmacy.  -Electrolyte abnormalities or hyper/hypoglycemia -Hypercarbia or hypoxia -ACS or arrhythmia   Dr. Liana Gerold recommendations on chart review include: -Routine IV hydration -Routine sepsis workup -Decadron 10mg  IV ONCE, followed by 4mg  TID -Begin Keppra 1000mg  BID for likely seizures -MRI brain with and without contrast once stable enough -Routine EEG, T/C long term EEG if workup, MRI normal, and patient not improved -If tumor progression is apparent and driving current clinical picture, hospice referral recommended  Decadron/keppra ordered. CTH/labs ordered as well.   Clinical Course as of 07/06/23 1318  Mon Jul 06, 2023  1128 Per radiology, noted significant progression of tumor since 06/05/23. New focus of acute hemorrhage of inferior/posterior margin of mass measuring up to 15 mm. More significant finding is progression of mass with worsening of midline shift, previously 10 mm, now 19 mm. New uncal hernia and entrapment of lateral ventricle.  [HN]  1131 Consulting to both neurosurgery and neuro-oncology [HN]   1131 VBG, CBC, CMP, trop, lactic unremarkable [HN]  1203 D/w Dr. Barbaraann Cao who examined CT scan and unfortunately recommends inpatient hospice/palliative care in the s/o rapid progression of GBM and absence of other factors such as UTI or sepsis. She is afebrile with no white count. Urine hasn't been completed yet but will consult hospitalist/palliative.  [HN]  1223 D/w palliative care who will see  her this afternoon.  [HN]  1315 Discussed extensively with patient's husband, who gracefully accepts the news and asks for the findings to be written down so he can explain it to their children. D/w Dr. Kirby Crigler who accepts patient for inpatient care.  [HN]    Clinical Course User Index [HN] Loetta Rough, MD    Labs: I Ordered, and personally interpreted labs.  The pertinent results include:  those listed above  Imaging Studies ordered: I ordered imaging studies including CTH I independently visualized and interpreted imaging. I agree with the radiologist interpretation  Additional history obtained from chart review.  Cardiac Monitoring: The patient was maintained on a cardiac monitor.  I personally viewed and interpreted the cardiac monitored which showed an underlying rhythm of: NSR  Reevaluation: After the interventions noted above, I reevaluated the patient and found that they have :stayed the same  Social Determinants of Health: Lives with husband  Disposition:  admission for inpatient hospice, followed by neuro-onc and palliative care  Co morbidities that complicate the patient evaluation  Past Medical History:  Diagnosis Date   Allergy    Anemia    last check up HGb was normal   Angio-edema    Asthma    allergy related or with respiratory illiness   Chronic sinusitis    Eczema    GERD (gastroesophageal reflux disease)    Headache(784.0)    migraines   Hyperlipidemia    Pneumonia    x 1   PONV (postoperative nausea and vomiting)    Right sided weakness 05/27/2023    Urticaria      Medicines Meds ordered this encounter  Medications   dexamethasone (DECADRON) injection 10 mg   DISCONTD: levETIRAcetam (KEPPRA) IVPB 1000 mg/100 mL premix   ibuprofen (ADVIL) tablet 400 mg   fentaNYL (SUBLIMAZE) injection 12.5-50 mcg   OR Linked Order Group    ondansetron (ZOFRAN) tablet 4 mg    ondansetron (ZOFRAN) injection 4 mg    I have reviewed the patients home medicines and have made adjustments as needed  Problem List / ED Course: Problem List Items Addressed This Visit   None Visit Diagnoses     Somnolence    -  Primary   Uncal herniation (HCC)                       This note was created using dictation software, which may contain spelling or grammatical errors.    Loetta Rough, MD 07/06/23 1318

## 2023-07-06 NOTE — Progress Notes (Signed)
[facility] at [facility] [address]  [place], [place] [address] [phone]   Interval Evaluation  Date of Service: 07/06/23 Patient Name: Joyce Jacobs Patient MRN: [id number] Patient DOB: 06/06/1961 Provider: Henreitta Leber, MD  Identifying Statement:  Joyce Jacobs is a 62 y.o. female with left frontal glioblastoma   Oncologic History: Oncology History  Glioblastoma (HCC)  06/09/2023 Initial Diagnosis   Glioblastoma (HCC)   06/09/2023 Surgery   Stereotactic biosy with Dr. Conchita Paris; path is high grade glioma, IDH pending   06/23/2023 -  Chemotherapy   Patient is on Treatment Plan : BRAIN GLIOBLASTOMA Radiation Therapy With Concurrent Temozolomide 75 mg/m2 Daily Followed By Sequential Maintenance Temozolomide x 6-12 cycles       Biomarkers:  MGMT Unknown.  IDH 1/2 Wild type.  EGFR Unknown  TERT Unknown   Interval History: Joyce Jacobs presents today for follow up, now having completed 2 weeks of radiation and Temodar.  Her husband reports significant decline in function since late last week.  She has had decreased arousal, sleeping much of the day.  Unable to take much of anything by mouth including medications, water.  He has noticed shaking episodes in the left arm and leg, a handful of times in the past week.  Decadron was stopped over the weekend because of no oral intake.    H+P (06/01/23) Patient presents to review recent neurologic symptoms.  She describes a roughly 1 month history of progressive right sided weakness, affecting the arm, leg and face.  This has led to several falls, most notably while she was overseas in [place] with her husband in early September.  Her PCP ordered an MRI of the brain which demonstrated a multifocal enhancing mass within the left frontal lobe.  Patient is left handed and doesn't endorse any language symptoms.  Decadron 4mg  TID for the past week has not improved symptoms meaningfully.  Otherwise  denies headaches and seizures.   Medications: Current Outpatient Medications on File Prior to Visit  Medication Sig Dispense Refill   albuterol (VENTOLIN HFA) 108 (90 Base) MCG/ACT inhaler inhale 2 puffs every 4-6 hours as needed for cough, wheeze, tightness in chest, shortness of breath 1 each 1   azelastine (ASTELIN) 0.1 % nasal spray 2 sprays per nostril 1-2 times daily as needed. 30 mL 5   baclofen (LIORESAL) 10 MG tablet Take 1 tablet (10 mg total) by mouth 3 (three) times daily as needed. for muscle spams 90 tablet 5   dexamethasone (DECADRON) 4 MG tablet Take 1 tablet (4 mg total) by mouth daily.     docusate sodium (COLACE) 50 MG capsule Take 50 mg by mouth daily.     EPINEPHRINE 0.3 mg/0.3 mL IJ SOAJ injection INJECT 0.3 MG INTO THE MUSCLE ONCE FOR 1 DOSE 2 each 0   estradiol (ESTRACE) 0.1 MG/GM vaginal cream Use twice a week as directed prn 42.5 g 5   Evolocumab (REPATHA SURECLICK) 140 MG/ML SOAJ Inject 140 mg into the skin every 14 (fourteen) days. 2 mL 5   ezetimibe (ZETIA) 10 MG tablet Take 1 tablet by mouth once daily 90 tablet 1   famotidine (PEPCID) 40 MG tablet Take 1 tablet (40 mg total) by mouth at bedtime. 90 tablet 3   fexofenadine (ALLEGRA) 180 MG tablet Take 180 mg by mouth daily as needed for allergies.     guaiFENesin (MUCINEX) 600 MG 12 hr tablet Take 600 mg by mouth 2 (two) times daily.  naproxen sodium (ANAPROX) 220 MG tablet Take 440 mg by mouth daily as needed (headache).     ondansetron (ZOFRAN) 8 MG tablet Take 1 tablet (8 mg total) by mouth every 8 (eight) hours as needed for nausea or vomiting. May take 30-60 minutes prior to Temodar administration if nausea/vomiting occurs 30 tablet 1   Rimegepant Sulfate (NURTEC) 75 MG TBDP Take 1 tablet (75 mg total) by mouth every other day. 15 tablet 0   SUMAtriptan (IMITREX) 100 MG tablet TAKE 1 TABLET BY MOUTH AT THE 1ST SIGN OF A MIGRAINE. MAY REPEAT IN 2 HOURS, MAX 2 TABLETS PER 24 HOURS (MAY FILL EVERY 25 DAYS) 12  tablet 3   temozolomide (TEMODAR) 100 MG capsule Take 1 capsule (100 mg total) by mouth daily. (Take with 1 (one) 20 mg capsule for total daily dose of 120mg ). May take on an empty stomach to decrease nausea & vomiting. 42 capsule 0   temozolomide (TEMODAR) 20 MG capsule Take 1 capsule (20 mg total) by mouth daily. (Take with 1 (one) 100 mg capsule for total daily dose of 120mg ). May take on an empty stomach to decrease nausea & vomiting. 42 capsule 0   No current facility-administered medications on file prior to visit.    Allergies:  Allergies  Allergen Reactions   Codeine Nausea Only   Vicodin [Hydrocodone-Acetaminophen] Itching and Rash   Crestor [Rosuvastatin Calcium] Other (See Comments)    Muscle aches    Lipitor [Atorvastatin]     Constipation, fatigue, achy   Pravachol [Pravastatin Sodium] Other (See Comments)    Drowsiness and memory trouble   Singulair [Montelukast Sodium] Other (See Comments)    Headache   Augmentin [Amoxicillin-Pot Clavulanate]     Rash, redness, swelling along the right side of the face   Imipramine Other (See Comments)    Drowsiness   Topamax [Topiramate] Other (See Comments)    Drowsiness, stuttering and memory trouble   Past Medical History:  Past Medical History:  Diagnosis Date   Allergy    Anemia    last check up HGb was normal   Angio-edema    Asthma    allergy related or with respiratory illiness   Chronic sinusitis    Eczema    GERD (gastroesophageal reflux disease)    Headache(784.0)    migraines   Hyperlipidemia    Pneumonia    x 1   PONV (postoperative nausea and vomiting)    Right sided weakness 05/27/2023   Urticaria    Past Surgical History:  Past Surgical History:  Procedure Laterality Date   ADENOIDECTOMY     APPLICATION OF CRANIAL NAVIGATION Left 06/09/2023   Procedure: APPLICATION OF CRANIAL NAVIGATION;  Surgeon: Lisbeth Renshaw, MD;  Location: [facility] OR;  Service: Neurosurgery;  Laterality: Left;   BREAST BIOPSY  Left 02/2022   CESAREAN SECTION     times three   COLONOSCOPY  05/12/2011   Procedure: COLONOSCOPY;  Surgeon: Corbin Ade, MD;  Location: [facility] END[facility] SUITE;  Service: Endoscopy;  Laterality: N/A;  8:30 AM   DILATION AND CURETTAGE OF UTERUS     ENDOMETRIAL ABLATION     FACIAL FRACTURE SURGERY  08/26/1987   broken nose and cheek bone   HEAD & NECK WOUND REPAIR / CLOSURE     times 5   PR DURAL GRAFT SPINAL Left 06/09/2023   Procedure: Left Stereotactic BRAIN BIOPSY;  Surgeon: Lisbeth Renshaw, MD;  Location: [facility] OR;  Service: Neurosurgery;  Laterality: Left;   ROBOTIC ASSISTED  TOTAL HYSTERECTOMY WITH BILATERAL SALPINGO OOPHERECTOMY Bilateral 03/05/2015   Procedure: ROBOTIC ASSISTED TOTAL HYSTERECTOMY WITH BILATERAL SALPINGO OOPHORECTOMY;  Surgeon: Olivia Mackie, MD;  Location: [facility] ORS;  Service: Gynecology;  Laterality: Bilateral;   SINOSCOPY     SKIN GRAFT Right    Eardrum   TONSILLECTOMY AND ADENOIDECTOMY     TYMPANOPLASTY  [id number],8119   TYMPANOSTOMY TUBE PLACEMENT     Social History:  Social History   Socioeconomic History   Marital status: Married    Spouse name: Not on file   Number of children: Not on file   Years of education: Not on file   Highest education level: Master's degree (e.g., MA, MS, MEng, MEd, MSW, MBA)  Occupational History   Not on file  Tobacco Use   Smoking status: Never   Smokeless tobacco: Never  Vaping Use   Vaping status: Never Used  Substance and Sexual Activity   Alcohol use: Yes    Alcohol/week: 3.0 standard drinks of alcohol    Types: 3 Standard drinks or equivalent per week   Drug use: No   Sexual activity: Yes    Birth control/protection: Surgical    Comment: Hysterectomy  Other Topics Concern   Not on file  Social History Narrative   Not on file   Social Determinants of Health   Financial Resource Strain: Low Risk  (04/02/2023)   Overall Financial Resource Strain (CARDIA)    Difficulty of Paying Living Expenses: Not hard at all  Food  Insecurity: No Food Insecurity (04/02/2023)   Hunger Vital Sign    Worried About Running Out of Food in the Last Year: Never true    Ran Out of Food in the Last Year: Never true  Transportation Needs: No Transportation Needs (04/02/2023)   PRAPARE - Administrator, Civil Service (Medical): No    Lack of Transportation (Non-Medical): No  Physical Activity: Insufficiently Active (04/02/2023)   Exercise Vital Sign    Days of Exercise per Week: 3 days    Minutes of Exercise per Session: 30 min  Stress: No Stress Concern Present (04/02/2023)   [employer] of Occupational Health - Occupational Stress Questionnaire    Feeling of Stress : Only a little  Social Connections: Moderately Isolated (04/02/2023)   Social Connection and Isolation Panel [NHANES]    Frequency of Communication with Friends and Family: Once a week    Frequency of Social Gatherings with Friends and Family: Once a week    Attends Religious Services: More than 4 times per year    Active Member of [employer] or Organizations: No    Attends Engineer, structural: Not on file    Marital Status: Married  Catering manager Violence: Not on file   Family History:  Family History  Problem Relation Age of Onset   Hypertension Mother    Diabetes Mother    Heart attack Father    Hyperlipidemia Father    Ovarian cancer Maternal Grandmother     Review of Systems: Constitutional: Doesn't report fevers, chills or abnormal weight loss Eyes: Doesn't report blurriness of vision Ears, nose, mouth, throat, and face: Doesn't report sore throat Respiratory: Doesn't report cough, dyspnea or wheezes Cardiovascular: Doesn't report palpitation, chest discomfort  Gastrointestinal:  Doesn't report nausea, constipation, diarrhea GU: Doesn't report incontinence Skin: Doesn't report skin rashes Neurological: Per HPI Musculoskeletal: Doesn't report joint pain Behavioral/Psych: Doesn't report anxiety  Physical Exam: There were no  vitals filed for this visit.  KPS: 50. General: Alert,  cooperative, pleasant, in no acute distress Head: Normal EENT: No conjunctival injection or scleral icterus.  Lungs: Resp effort normal Cardiac: Regular rate Abdomen: Non-distended abdomen Skin: No rashes cyanosis or petechiae. Extremities: No clubbing or edema  Neurologic Exam: Mental Status: Not awake, arouses to stimulation but doesn't achieve alertness Cranial Nerves: VExtra-ocular movements intact. No ptosis. R UMN facial paresis. Motor: Tone and bulk are normal. Power is 2/5 in right leg, 2/5 in right arm. Reflexes are symmetric, no pathologic reflexes present.  Sensory: Intact grossly Gait: Non ambulatory   Labs: I have reviewed the data as listed    Component Value Date/Time   NA 138 06/09/2023 0819   NA 139 03/04/2023 0752   K 4.3 06/09/2023 0819   CL 103 06/09/2023 0819   CO2 24 06/09/2023 0819   GLUCOSE 101 (H) 06/09/2023 0819   BUN 13 06/09/2023 0819   BUN 15 03/04/2023 0752   CREATININE 0.69 06/09/2023 0819   CREATININE 0.81 03/10/2016 0722   CALCIUM 9.3 06/09/2023 0819   PROT 6.4 03/04/2023 0752   ALBUMIN 4.4 03/04/2023 0752   AST 15 03/04/2023 0752   ALT 11 03/04/2023 0752   ALKPHOS 64 03/04/2023 0752   BILITOT 0.3 03/04/2023 0752   GFRNONAA >60 06/09/2023 0819   GFRAA 102 02/23/2018 0806   Lab Results  Component Value Date   WBC 16.8 (H) 06/09/2023   NEUTROABS 3.3 03/04/2023   HGB 14.8 06/09/2023   HCT 46.2 (H) 06/09/2023   MCV 91.8 06/09/2023   PLT 207 06/09/2023   Assessment/Plan Glioblastoma (HCC)  We appreciate the opportunity to participate in the care of [employer].  She presents today with significant clinical decline.  Etiology is either frank progression of tumor refractory to radiation therapy, or neurologic decompensation secondary to systemic insult such as infection, dehydration, untreated seizures.    We recommended transitioning to ED for medical workup and admission.   Recommended the following: -Routine IV hydration -Routine sepsis workup -Decadron 10mg  IV ONCE, followed by 4mg  TID -Begin Keppra 1000mg  BID for likely seizures -MRI brain with and without contrast once stable enough -Routine EEG, T/C long term EEG if workup, MRI normal, and patient not improved -If tumor progression is apparent and driving current clinical picture, hospice referral recommended  We will of course defer any chemotherapy and radiation treatments pending the above.    All questions were answered. The patient knows to call the clinic with any problems, questions or concerns. No barriers to learning were detected.  The total time spent in the encounter was 40 minutes and more than 50% was on counseling and review of test results   Henreitta Leber, MD Medical Director of Neuro-Oncology [facility] at [facility] 07/06/23 8:42 AM

## 2023-07-06 NOTE — ED Triage Notes (Signed)
[facility] from [facility] with c/o lethargy. Pt will not respond to verbal commands. Possible UTI and dehydration. Non ambulatory at baseline. Hx of brain tumor. Not eating or drinking well since Friday per husband.

## 2023-07-06 NOTE — Care Management (Addendum)
Transition of Care [facility]) - Emergency Department Mini Assessment   Patient Details  Name: Joyce Jacobs MRN: [id number] Date of Birth: [birth year]  Transition of Care [facility]) CM/SW Contact:    Lavenia Atlas, RN Phone Number: 07/06/2023, 1:48 PM   Clinical Narrative: Per chart review patient currently in [facility] ED for lethargy and being admitted inpatient. Received secure chat for inpatient hospice. No TOC consult on file. MD has made referral, Misty with [facility] will follow.   TOC will follow.   - 4:06 pm Received message from [facility] with [facility] who reports patient's husband is requesting hospice facility closer to their home.    -4:15pm This RNCM unsuccessful with speaking with patient's husband at bedside, left voicemail for patient's husband Loraine Leriche, awaiting a call back. This RNCM left a list of hospice facilities [email or web address] near the [place] area, with bedside RN.  TOC following for needs.   ED Mini Assessment: What brought you to the Emergency Department? : c/o lethargy.  Barriers to Discharge: Continued Medical Work up  [employer] interventions: Hospice referral     Interventions which prevented an admission or readmission: Hospice    Patient Contact and Communications        ,                 Admission diagnosis:  Transitioned from emergency department to hospice [Z78.9] Patient Active Problem List   Diagnosis Date Noted   Transitioned from emergency department to hospice 07/06/2023   Goals of care, counseling/discussion 06/16/2023   Glioblastoma (HCC) 06/09/2023   Brain mass 06/01/2023   Hypoestrogenism 03/03/2023   Acute intractable headache 10/29/2022   Aortic atherosclerosis (HCC) 02/11/2022   Elevated coronary artery calcium score 02/11/2022   Seasonal and perennial allergic rhinitis 02/07/2021   Allergic contact dermatitis due to chemical 02/07/2021   Mild intermittent asthma, uncomplicated 02/07/2021   Food intolerance 02/07/2021    Fibroids 03/05/2015   Familial hyperlipidemia 07/06/2014   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 [place] [address] Phone: [phone] Fax: [phone]

## 2023-07-06 NOTE — Plan of Care (Signed)
  Problem: Pain Management: Goal: General experience of comfort will improve Outcome: Progressing   Problem: Safety: Goal: Ability to remain free from injury will improve Outcome: Progressing   Problem: Skin Integrity: Goal: Risk for impaired skin integrity will decrease Outcome: Progressing

## 2023-07-06 NOTE — H&P (Signed)
History and Physical  Joyce Jacobs OZH:[id number] DOB: [birth year] DOA: 07/06/2023  PCP: Babs Sciara, MD   Chief Complaint: Somnolence  HPI: Joyce Jacobs is a 62 y.o. 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  She underwent radiation therapy with concurrent temozolomide but unfortunately she has had significant decline in function in the last 5 days.  She has decreased arousal, sleeping most of the day.  Unable to eat or drink.  She had been on some oral Decadron hoping over the last week that it would improve her symptoms, but has been too somnolent to take oral medications.  Today she had a follow-up with her oncologist Dr. Barbaraann Cao, who sent her to the ER for evaluation.  CT scan as detailed below shows significant progressive worsening of her tumor with associated hemorrhage, increased midline shift, and left-sided uncal herniation.  ER provider discussed with Dr. Barbaraann Cao who recommends hospice.  Review of Systems: Please see HPI for pertinent positives and negatives. A complete 10 system review of systems are otherwise negative.  Past Medical History:  Diagnosis Date   Allergy    Anemia    last check up HGb was normal   Angio-edema    Asthma    allergy related or with respiratory illiness   Chronic sinusitis    Eczema    GERD (gastroesophageal reflux disease)    Headache(784.0)    migraines   Hyperlipidemia    Pneumonia    x 1   PONV (postoperative nausea and vomiting)    Right sided weakness 05/27/2023   Urticaria    Past Surgical History:  Procedure Laterality Date   ADENOIDECTOMY     APPLICATION OF CRANIAL NAVIGATION Left 06/09/2023   Procedure: APPLICATION OF CRANIAL NAVIGATION;  Surgeon: Lisbeth Renshaw, MD;  Location: [facility] OR;  Service: Neurosurgery;  Laterality: Left;   BREAST BIOPSY Left 02/2022   CESAREAN SECTION     times three   COLONOSCOPY  05/12/2011   Procedure: COLONOSCOPY;  Surgeon:  Corbin Ade, MD;  Location: [facility]ITE;  Service: Endoscopy;  Laterality: N/A;  8:30 AM   DILATION AND CURETTAGE OF UTERUS     ENDOMETRIAL ABLATION     FACIAL FRACTURE SURGERY  08/26/1987   broken nose and cheek bone   HEAD & NECK WOUND REPAIR / CLOSURE     times 5   PR DURAL GRAFT SPINAL Left 06/09/2023   Procedure: Left Stereotactic BRAIN BIOPSY;  Surgeon: Lisbeth Renshaw, MD;  Location: [facility] OR;  Service: Neurosurgery;  Laterality: Left;   ROBOTIC ASSISTED TOTAL HYSTERECTOMY WITH BILATERAL SALPINGO OOPHERECTOMY Bilateral 03/05/2015   Procedure: ROBOTIC ASSISTED TOTAL HYSTERECTOMY WITH BILATERAL SALPINGO OOPHORECTOMY;  Surgeon: Olivia Mackie, MD;  Location: [facility] ORS;  Service: Gynecology;  Laterality: Bilateral;   SINOSCOPY     SKIN GRAFT Right    Eardrum   TONSILLECTOMY AND ADENOIDECTOMY     TYMPANOPLASTY  [id number]   TYMPANOSTOMY TUBE PLACEMENT      Social History:  reports that she has never smoked. She has never used smokeless tobacco. She reports current alcohol use of about 3.0 standard drinks of alcohol per week. She reports that she does not use drugs.   Allergies  Allergen Reactions   Codeine Nausea Only   Vicodin [Hydrocodone-Acetaminophen] Itching and Rash   Crestor [Rosuvastatin Calcium] Other (See Comments)    Muscle aches    Lipitor [Atorvastatin]     Constipation, fatigue, achy   Pravachol [employer]  Sodium] Other (See Comments)    Drowsiness and memory trouble   Singulair [Montelukast Sodium] Other (See Comments)    Headache   Augmentin [Amoxicillin-Pot Clavulanate]     Rash, redness, swelling along the right side of the face   Imipramine Other (See Comments)    Drowsiness   Topamax [Topiramate] Other (See Comments)    Drowsiness, stuttering and memory trouble    Family History  Problem Relation Age of Onset   Hypertension Mother    Diabetes Mother    Heart attack Father    Hyperlipidemia Father    Ovarian cancer Maternal Grandmother       Prior to Admission medications   Medication Sig Start Date End Date Taking? Authorizing Provider  albuterol (VENTOLIN HFA) 108 (90 Base) MCG/ACT inhaler inhale 2 puffs every 4-6 hours as needed for cough, wheeze, tightness in chest, shortness of breath 04/03/21   Nehemiah Settle, FNP  azelastine (ASTELIN) 0.1 % nasal spray 2 sprays per nostril 1-2 times daily as needed. 10/29/22   Alfonse Spruce, MD  baclofen (LIORESAL) 10 MG tablet Take 1 tablet (10 mg total) by mouth 3 (three) times daily as needed. for muscle spams 08/29/22   Babs Sciara, MD  dexamethasone (DECADRON) 4 MG tablet Take 1 tablet (4 mg total) by mouth daily. 06/01/23   Henreitta Leber, MD  docusate sodium (COLACE) 50 MG capsule Take 50 mg by mouth daily.    [provider]  EPINEPHRINE 0.3 mg/0.3 mL IJ SOAJ injection INJECT 0.3 MG INTO THE MUSCLE ONCE FOR 1 DOSE 02/12/22   Alfonse Spruce, MD  estradiol (ESTRACE) 0.1 MG/GM vaginal cream Use twice a week as directed prn 03/03/23   Campbell Riches, NP  Evolocumab (REPATHA SURECLICK) 140 MG/ML SOAJ Inject 140 mg into the skin every 14 (fourteen) days. 03/03/23   Campbell Riches, NP  ezetimibe (ZETIA) 10 MG tablet Take 1 tablet by mouth once daily 01/21/23   Babs Sciara, MD  famotidine (PEPCID) 40 MG tablet Take 1 tablet (40 mg total) by mouth at bedtime. 08/29/22   Babs Sciara, MD  fexofenadine (ALLEGRA) 180 MG tablet Take 180 mg by mouth daily as needed for allergies.    [provider]  guaiFENesin (MUCINEX) 600 MG 12 hr tablet Take 600 mg by mouth 2 (two) times daily.    [provider]  naproxen sodium (ANAPROX) 220 MG tablet Take 440 mg by mouth daily as needed (headache).    [provider]  ondansetron (ZOFRAN) 8 MG tablet Take 1 tablet (8 mg total) by mouth every 8 (eight) hours as needed for nausea or vomiting. May take 30-60 minutes prior to Temodar administration if nausea/vomiting occurs 06/23/23   Vaslow, Georgeanna Lea,  MD  Rimegepant Sulfate (NURTEC) 75 MG TBDP Take 1 tablet (75 mg total) by mouth every other day. 11/04/22   Tommie Sams, DO  SUMAtriptan (IMITREX) 100 MG tablet TAKE 1 TABLET BY MOUTH AT THE 1ST SIGN OF A MIGRAINE. MAY REPEAT IN 2 HOURS, MAX 2 TABLETS PER 24 HOURS (MAY FILL EVERY 25 DAYS) 08/29/22   Babs Sciara, MD  temozolomide (TEMODAR) 100 MG capsule Take 1 capsule (100 mg total) by mouth daily. (Take with 1 (one) 20 mg capsule for total daily dose of 120mg ). May take on an empty stomach to decrease nausea & vomiting. 06/16/23   Henreitta Leber, MD  temozolomide (TEMODAR) 20 MG capsule Take 1 capsule (20 mg total) by  mouth daily. (Take with 1 (one) 100 mg capsule for total daily dose of 120mg ). May take on an empty stomach to decrease nausea & vomiting. 06/16/23   Henreitta Leber, MD    Physical Exam: BP 115/70 (BP Location: Left Arm)   Pulse 74   Temp 97.6 F (36.4 C) (Axillary)   Resp 15   Ht 5\' 3"  (1.6 m)   Wt 70.3 kg   LMP 03/05/2015   SpO2 94%   BMI 27.46 kg/m   General: Obtunded, sleeping comfortably Cardiovascular: RRR, no murmurs or rubs, no peripheral edema  Respiratory: clear to auscultation on anterior exam, equal chest rise without tachypnea or rhonchi Abdomen: soft, nondistended Skin: dry, no rashes  Musculoskeletal: no joint effusions, normal range of motion           Labs on Admission:  Basic Metabolic Panel: Recent Labs  Lab 07/06/23 1025 07/06/23 1039  NA 141 144  K 3.7 3.8  CL 106 108  CO2 23  --   GLUCOSE 121* 115*  BUN 26* 25*  CREATININE 0.76 0.70  CALCIUM 9.2  --   MG 2.6*  --    Liver Function Tests: Recent Labs  Lab 07/06/23 1025  AST 17  ALT 23  ALKPHOS 124  BILITOT 0.7  PROT 6.8  ALBUMIN 3.7   No results for input(s): "LIPASE", "AMYLASE" in the last 168 hours. No results for input(s): "AMMONIA" in the last 168 hours. CBC: Recent Labs  Lab 07/06/23 1025 07/06/23 1039  WBC 8.9  --   NEUTROABS 6.9  --   HGB 14.3 15.0   HCT 43.2 44.0  MCV 92.7  --   PLT 223  --    Cardiac Enzymes: No results for input(s): "CKTOTAL", "CKMB", "CKMBINDEX", "TROPONINI" in the last 168 hours.  BNP (last 3 results) No results for input(s): "BNP" in the last 8760 hours.  ProBNP (last 3 results) No results for input(s): "PROBNP" in the last 8760 hours.  CBG: Recent Labs  Lab 07/06/23 0951  GLUCAP 111*    Radiological Exams on Admission: CT Head Wo Contrast  Result Date: 07/06/2023 CLINICAL DATA:  Mental status change, unknown cause Head/neck cancer, monitor. EXAM: CT HEAD WITHOUT CONTRAST TECHNIQUE: Contiguous axial images were obtained from the base of the skull through the vertex without intravenous contrast. RADIATION DOSE REDUCTION: This exam was performed according to the departmental dose-optimization program which includes automated exposure control, adjustment of the mA and/or kV according to patient size and/or use of iterative reconstruction technique. COMPARISON:  MRI brain 06/05/2023. FINDINGS: Brain: Interval increase in size of the heterogeneous, predominantly solid mass centered within the left corona radiata with extension across the corpus callosum associated involvement left basal ganglia and insula. New focus of acute hemorrhage along the inferior posterior margin of the mass, measuring up to 15 mm (axial image 18 series 2). Increased leftward midline shift, now measuring up to 19 mm, previously 10 mm. Near-complete effacement of the left lateral ventricle and third ventricle. Slightly increased size of the right lateral ventricle with mild periventricular hypoattenuation, likely reflecting transependymal resorption of CSF and suspicious for entrapment. Associated left uncal herniation. Vascular: No hyperdense vessel or unexpected calcification. Skull: Left frontal burr hole. No calvarial fracture. Skull base is unremarkable. Sinuses/Orbits: Chronic right maxillary sinusitis with evidence of prior sinus surgery.  Orbits are unremarkable. Other: None. IMPRESSION: 1. Interval increase in size of the heterogeneous, predominantly solid mass centered within the left corona radiata with extension across the corpus  callosum and associated involvement of the left basal ganglia and insula. New focus of acute hemorrhage along the inferior posterior margin of the mass, measuring up to 15 mm. 2. Increased leftward midline shift, now measuring up to 19 mm, previously 10 mm. 3. Near-complete effacement of the left lateral ventricle and third ventricle. Slightly increased size of the right lateral ventricle with mild periventricular hypoattenuation, likely reflecting transependymal resorption of CSF and suspicious for entrapment. Associated left uncal herniation. These results were called by telephone at the time of interpretation on 07/06/2023 at 11:30 am to provider HAYLEY NAASZ , who verbally acknowledged these results. Electronically Signed   By: Orvan Falconer M.D.   On: 07/06/2023 11:30    Assessment/Plan Shammara Granderson is a 62 y.o. female with high-grade glioma diagnosed in October 2024 who has undergone radiation and concurrent Temodar unfortunately being admitted with rapidly progressive tumor, worsening midline shift, and uncal herniation.  Given the above findings, ER provider has discussed with her oncologist, who recommends hospice.  I discussed personally with ER provider, and with the patient's husband at the bedside.  He has a good understanding of the unfortunate circumstances, and agrees with comfort care, with transition to hospice. -Observation admission -Supportive care as needed -Will discontinue all monitoring, labs, vitals, etc. -Discussed with hospice coordinator and TOC, they will work on arranging hospice services I anticipate she will be a good candidate for inpatient hospice    Code Status: Do not attempt resuscitation (DNR) - Comfort care  Consults called: None  Admission status:  Observation  Time spent: 59 minutes  Hersel Mcmeen Sharlette Dense MD [facility] Pager [phone]  If 7PM-7AM, please contact night-coverage [email or web address] Password [email or web address]  07/06/2023, 1:38 PM

## 2023-07-07 ENCOUNTER — Encounter: Payer: Self-pay | Admitting: Radiation Oncology

## 2023-07-07 ENCOUNTER — Other Ambulatory Visit: Payer: Self-pay | Admitting: Family Medicine

## 2023-07-07 ENCOUNTER — Ambulatory Visit: Payer: BC Managed Care – PPO

## 2023-07-07 ENCOUNTER — Other Ambulatory Visit (HOSPITAL_COMMUNITY): Payer: Self-pay

## 2023-07-07 DIAGNOSIS — Z515 Encounter for palliative care: Secondary | ICD-10-CM

## 2023-07-07 DIAGNOSIS — G935 Compression of brain: Secondary | ICD-10-CM | POA: Diagnosis not present

## 2023-07-07 MED ORDER — LORAZEPAM 2 MG/ML PO CONC: 1.0000 mg | ORAL | 0 refills | Status: DC | PRN

## 2023-07-07 MED ORDER — MORPHINE SULFATE 20 MG/5ML PO SOLN
5.0000 mg | ORAL | 0 refills | Status: DC | PRN
Start: 2023-07-07 — End: 2023-07-14
  Filled 2023-07-07: qty 100, 7d supply, fill #0

## 2023-07-07 MED ORDER — MORPHINE SULFATE 20 MG/5ML PO SOLN: 5.0000 mg | ORAL | 0 refills | Status: DC | PRN

## 2023-07-07 NOTE — Discharge Summary (Addendum)
[facility]  Physician Discharge Summary   Patient ID: Joyce Jacobs MRN: [id number] DOB/AGE: [birth year] 64 y.o.  Admit date: 07/06/2023 Discharge date:   07/07/2023   PCP: Babs Sciara, MD  DISCHARGE DIAGNOSES:  High-grade glioma with rapid progression of disease   RECOMMENDATIONS FOR OUTPATIENT FOLLOW UP: Plan to discharge home with hospice services   CODE STATUS: DNR  DISCHARGE CONDITION: Stable  Diet recommendation: Comfort feeds as tolerated  INITIAL HISTORY: 62 y.o. 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  She underwent radiation therapy with concurrent temozolomide but unfortunately she has had significant decline in function in the last 5 days.  She has decreased arousal, sleeping most of the day.  Unable to eat or drink.  She had been on some oral Decadron hoping over the last week that it would improve her symptoms, but has been too somnolent to take oral medications.  Today she had a follow-up with her oncologist Dr. Barbaraann Cao, who sent her to the ER for evaluation.  CT scan as detailed below shows significant progressive worsening of her tumor with associated hemorrhage, increased midline shift, and left-sided uncal herniation.  ER provider discussed with Dr. Barbaraann Cao who recommends hospice.   HOSPITAL COURSE:   Patient with rapidly progressing high-grade glioma.  CT scan showed uncal herniation.  Patient was hospitalized and transitioned to hospice.  Hospice services was consulted.  Discussed with patient's husband this morning who would prefer for her to come home with hospice services instead of going to residential hospice.  He is working with his friends to arrange for support at home.  Hospital bed to be arranged today as per husband.  Will involve TOC to provide as much assistance as we can provide.  He feels that he can transport the patient on his own.  Prescription for morphine as well as  Ativan's have been sent to her pharmacy.      PERTINENT LABS:  The results of significant diagnostics from this hospitalization (including imaging, microbiology, ancillary and laboratory) are listed below for reference.    Microbiology: Recent Results (from the past 240 hour(s))  SARS Coronavirus 2 by RT PCR (hospital order, performed in [facility] hospital lab) *cepheid single result test* Anterior Nasal Swab     Status: None   Collection Time: 07/06/23 10:59 AM   Specimen: Anterior Nasal Swab  Result Value Ref Range Status   SARS Coronavirus 2 by RT PCR NEGATIVE NEGATIVE Final    Comment: (NOTE) SARS-CoV-2 target nucleic acids are NOT DETECTED.  The SARS-CoV-2 RNA is generally detectable in upper and lower respiratory specimens during the acute phase of infection. The lowest concentration of SARS-CoV-2 viral copies this assay can detect is 250 copies / mL. A negative result does not preclude SARS-CoV-2 infection and should not be used as the sole basis for treatment or other patient management decisions.  A negative result may occur with improper specimen collection / handling, submission of specimen other than nasopharyngeal swab, presence of viral mutation(s) within the areas targeted by this assay, and inadequate number of viral copies (<250 copies / mL). A negative result must be combined with clinical observations, patient history, and epidemiological information.  Fact Sheet for Patients:   RoadLapTop.co.za  Fact Sheet for Healthcare Providers: http://kim-miller.com/  This test is not yet approved or  cleared by the [place] FDA and has been authorized for detection and/or diagnosis of SARS-CoV-2 by FDA under an Emergency Use Authorization (  EUA).  This EUA will remain in effect (meaning this test can be used) for the duration of the COVID-19 declaration under Section 564(b)(1) of the Act, 21 U.S.C. section  360bbb-3(b)(1), unless the authorization is terminated or revoked sooner.  Performed at [facility], [address] [place], [place] [address]      Labs:   Basic Metabolic Panel: Recent Labs  Lab 07/06/23 1025 07/06/23 1039  NA 141 144  K 3.7 3.8  CL 106 108  CO2 23  --   GLUCOSE 121* 115*  BUN 26* 25*  CREATININE 0.76 0.70  CALCIUM 9.2  --   MG 2.6*  --    Liver Function Tests: Recent Labs  Lab 07/06/23 1025  AST 17  ALT 23  ALKPHOS 124  BILITOT 0.7  PROT 6.8  ALBUMIN 3.7    CBC: Recent Labs  Lab 07/06/23 1025 07/06/23 1039  WBC 8.9  --   NEUTROABS 6.9  --   HGB 14.3 15.0  HCT 43.2 44.0  MCV 92.7  --   PLT 223  --      CBG: Recent Labs  Lab 07/06/23 0951  GLUCAP 111*     IMAGING STUDIES DG Chest Portable 1 View  Result Date: 07/06/2023 CLINICAL DATA:  Somnolence. EXAM: PORTABLE CHEST 1 VIEW COMPARISON:  09/11/2006. FINDINGS: Bilateral lung fields are clear. There is subtle blunting of left lateral costophrenic angle, which may suggest trace left pleural effusion. Right lateral costophrenic angle is clear. Normal cardio-mediastinal silhouette. No acute osseous abnormalities. The soft tissues are within normal limits. IMPRESSION: *Probable trace left pleural effusion. Otherwise no acute cardiopulmonary abnormality. Electronically Signed   By: Jules Schick M.D.   On: 07/06/2023 17:04   CT Head Wo Contrast  Result Date: 07/06/2023 CLINICAL DATA:  Mental status change, unknown cause Head/neck cancer, monitor. EXAM: CT HEAD WITHOUT CONTRAST TECHNIQUE: Contiguous axial images were obtained from the base of the skull through the vertex without intravenous contrast. RADIATION DOSE REDUCTION: This exam was performed according to the departmental dose-optimization program which includes automated exposure control, adjustment of the mA and/or kV according to patient size and/or use of iterative reconstruction technique. COMPARISON:  MRI  brain 06/05/2023. FINDINGS: Brain: Interval increase in size of the heterogeneous, predominantly solid mass centered within the left corona radiata with extension across the corpus callosum associated involvement left basal ganglia and insula. New focus of acute hemorrhage along the inferior posterior margin of the mass, measuring up to 15 mm (axial image 18 series 2). Increased leftward midline shift, now measuring up to 19 mm, previously 10 mm. Near-complete effacement of the left lateral ventricle and third ventricle. Slightly increased size of the right lateral ventricle with mild periventricular hypoattenuation, likely reflecting transependymal resorption of CSF and suspicious for entrapment. Associated left uncal herniation. Vascular: No hyperdense vessel or unexpected calcification. Skull: Left frontal burr hole. No calvarial fracture. Skull base is unremarkable. Sinuses/Orbits: Chronic right maxillary sinusitis with evidence of prior sinus surgery. Orbits are unremarkable. Other: None. IMPRESSION: 1. Interval increase in size of the heterogeneous, predominantly solid mass centered within the left corona radiata with extension across the corpus callosum and associated involvement of the left basal ganglia and insula. New focus of acute hemorrhage along the inferior posterior margin of the mass, measuring up to 15 mm. 2. Increased leftward midline shift, now measuring up to 19 mm, previously 10 mm. 3. Near-complete effacement of the left lateral ventricle and third ventricle. Slightly increased size of the  right lateral ventricle with mild periventricular hypoattenuation, likely reflecting transependymal resorption of CSF and suspicious for entrapment. Associated left uncal herniation. These results were called by telephone at the time of interpretation on 07/06/2023 at 11:30 am to provider HAYLEY NAASZ , who verbally acknowledged these results. Electronically Signed   By: Orvan Falconer M.D.   On: 07/06/2023  11:30    DISCHARGE EXAMINATION: Vitals:   07/06/23 1053 07/06/23 1053 07/06/23 1421 07/07/23 0635  BP:   98/64 117/73  Pulse:   65 88  Resp:   18 18  Temp:   (!) 97.5 F (36.4 C)   TempSrc:   Oral   SpO2:  94% 93% 96%  Weight: 70.3 kg     Height: 5\' 3"  (1.6 m)      Patient lying comfortably on the bed.  Asleep.  Opens her eyes but does not really respond much.   DISPOSITION: Home with hospice     Allergies as of 07/07/2023       Reactions   Codeine Nausea Only   Vicodin [hydrocodone-acetaminophen] Itching, Rash   Acetaminophen    Crestor [rosuvastatin Calcium] Other (See Comments)   Muscle aches    Hydrocodone    Latex    Lipitor [atorvastatin]    Constipation, fatigue, achy   Montelukast    Pravachol [pravastatin Sodium] Other (See Comments)   Drowsiness and memory trouble   Rosuvastatin    Singulair [montelukast Sodium] Other (See Comments)   Headache   Augmentin [amoxicillin-pot Clavulanate]    Rash, redness, swelling along the right side of the face   Imipramine Other (See Comments)   Drowsiness   Topamax [topiramate] Other (See Comments)   Drowsiness, stuttering and memory trouble        Medication List     STOP taking these medications    azelastine 0.1 % nasal spray Commonly known as: ASTELIN   baclofen 10 MG tablet Commonly known as: LIORESAL   dexamethasone 4 MG tablet Commonly known as: DECADRON   EPINEPHrine 0.3 mg/0.3 mL Soaj injection Commonly known as: EPI-PEN   estradiol 0.1 MG/GM vaginal cream Commonly known as: ESTRACE   ezetimibe 10 MG tablet Commonly known as: ZETIA   famotidine 40 MG tablet Commonly known as: PEPCID   fexofenadine 180 MG tablet Commonly known as: ALLEGRA   guaiFENesin 600 MG 12 hr tablet Commonly known as: MUCINEX   naproxen sodium 220 MG tablet Commonly known as: ALEVE   Nurtec 75 MG Tbdp Generic drug: Rimegepant Sulfate   Repatha SureClick 140 MG/ML Soaj Generic drug: Evolocumab    SUMAtriptan 100 MG tablet Commonly known as: IMITREX   temozolomide 100 MG capsule Commonly known as: TEMODAR   temozolomide 20 MG capsule Commonly known as: TEMODAR       TAKE these medications    albuterol 108 (90 Base) MCG/ACT inhaler Commonly known as: VENTOLIN HFA inhale 2 puffs every 4-6 hours as needed for cough, wheeze, tightness in chest, shortness of breath   docusate sodium 50 MG capsule Commonly known as: COLACE Take 50 mg by mouth daily.   LORazepam 2 MG/ML concentrated solution Commonly known as: ATIVAN Take 0.5-1 mLs (1-2 mg total) by mouth every 4 (four) hours as needed for anxiety.   morphine 20 MG/5ML solution Take 1.3 mLs (5.2 mg total) by mouth every 2 (two) hours as needed for pain.   ondansetron 8 MG tablet Commonly known as: Zofran Take 1 tablet (8 mg total) by mouth every 8 (eight) hours as  needed for nausea or vomiting. May take 30-60 minutes prior to Temodar administration if nausea/vomiting occurs           TOTAL DISCHARGE TIME: 35 minutes  Khamauri Bauernfeind [employer] on [email or web address]  07/07/2023, 10:38 AM

## 2023-07-07 NOTE — Progress Notes (Signed)
      Consult received and chart reviewed. Discussed with EDP Dr. Jearld Fenton earlier this afternoon. Since then, patient has been transitioned to comfort care with plan for transfer to inpatient hospice facility.  PMT consult was canceled by EDP. Please re-consult PMT if needed.     Sherlean Foot, NP-C Palliative Medicine   Please call Palliative Medicine team phone with any questions [phone]. For individual providers please see AMION.   No charge

## 2023-07-07 NOTE — Plan of Care (Signed)
  Problem: Activity: Goal: Risk for activity intolerance will decrease Outcome: Progressing   Problem: Elimination: Goal: Will not experience complications related to bowel motility Outcome: Progressing   Problem: Pain Management: Goal: General experience of comfort will improve Outcome: Progressing   Problem: Skin Integrity: Goal: Risk for impaired skin integrity will decrease Outcome: Progressing

## 2023-07-07 NOTE — Progress Notes (Signed)
  Radiation Oncology         [phone] ________________________________  Name: Joyce Jacobs MRN: [id number]  Date: 07/07/2023  DOB: 11-11-1960  End of Treatment Note  Diagnosis:   Glioblastoma of the left frontal lobe     Indication for treatment: curative  Radiation treatment dates:   06/24/23-07/06/23  Site/planned dose:   The left cerebral tumor target was treated to a total of 18 Gy over 9 treatments, though her prescription was intended for 45 Gy over 23 fractions, and a 14 Gy boost in 7 fractions.  Narrative: The patient's neurologic status declined during her course of radiation, and her radiotherapy was discontinued after 9 treatments.  Plan: The patient will enroll in hospice care. We will be available as needed to the patient or her family.     Osker Mason, PAC

## 2023-07-07 NOTE — Progress Notes (Signed)
[facility] Liaison Note  Received request from [facility] for family interest in [facility]. Met with husband to confirm interest and explain services. He has decided to take the patient home with hospice services instead. He is making arrangements for private hospital bed delivery today. He plans to transport the patient by private vehicle today. Our AV nurse will assess for other DME needs.  Please send signed and completed DNR home with the patient/family. Please provide prescriptions at discharge as needed to ensure ongoing symptom management.   Thank you for the opportunity to participate in this patient's care.  Henderson Newcomer [employer] [phone]

## 2023-07-08 ENCOUNTER — Ambulatory Visit: Payer: BC Managed Care – PPO

## 2023-07-08 LAB — URINE CULTURE: Culture: >=100000 — AB

## 2023-07-08 NOTE — Radiation Completion Notes (Signed)
Patient Name: DEMITA, VOTTO MRN: [id number] Date of Birth: 1961-07-16 Referring Physician: Elissa Hefty, M.D. Date of Service: 2023-07-08 Radiation Oncologist: Dorothy Puffer, M.D. 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

## 2023-07-09 ENCOUNTER — Inpatient Hospital Stay: Payer: BC Managed Care – PPO

## 2023-07-09 ENCOUNTER — Ambulatory Visit: Payer: BC Managed Care – PPO

## 2023-07-09 ENCOUNTER — Telehealth: Payer: Self-pay | Admitting: Family Medicine

## 2023-07-09 ENCOUNTER — Other Ambulatory Visit: Payer: Self-pay | Admitting: Family Medicine

## 2023-07-09 ENCOUNTER — Inpatient Hospital Stay: Payer: BC Managed Care – PPO | Admitting: Internal Medicine

## 2023-07-09 MED ORDER — CEPHALEXIN 250 MG/5ML PO SUSR: ORAL | 0 refills | Status: DC

## 2023-07-09 NOTE — Telephone Encounter (Signed)
Patient has glioblastoma Hospice patient Home visit-lungs clear respiratory rate normal heart regular 74 skin turgor normal no edema skin warm dry patient sleeping  Husband states that she has had some awake spells with speaking some but not much in the way of physical activity she is bed ridden  Under [facility] [phone] Hospice #[id number]  Case manager-Christy was spoken with  When in the hospital patient did have a UTI discussed with husband he would prefer treatment.  Recommend cephalexin 250 mg per 5 mL, 10 mL taken 3 times daily for 3 days Husband was told that if she does not take in significant amount to only allow what she can take if  Prescription was sent

## 2023-07-10 ENCOUNTER — Other Ambulatory Visit: Payer: Self-pay

## 2023-07-10 ENCOUNTER — Ambulatory Visit: Payer: BC Managed Care – PPO

## 2023-07-13 ENCOUNTER — Ambulatory Visit: Payer: BC Managed Care – PPO

## 2023-07-13 ENCOUNTER — Telehealth: Payer: Self-pay

## 2023-07-13 ENCOUNTER — Other Ambulatory Visit (HOSPITAL_COMMUNITY): Payer: Self-pay

## 2023-07-13 ENCOUNTER — Telehealth: Payer: Self-pay | Admitting: *Deleted

## 2023-07-14 ENCOUNTER — Ambulatory Visit: Payer: BC Managed Care – PPO

## 2023-07-14 ENCOUNTER — Telehealth: Payer: Self-pay | Admitting: Family Medicine

## 2023-07-14 NOTE — Telephone Encounter (Signed)
Hi [name] This communication is to let you know that Joyce Jacobs passed away peacefully last evening. Family was appreciative of your care.  Also-apparently family was in the process of trying to get disability determination and when [employer] sent request to [name] it was stated that she was never a patient there.  Is there cancer liaison person who would be able to help the family with their questions regarding this.(Apparently they would need confirmation that the patient had cancer and a pathology report) if you can forward to me who they should contact I will let her husband know.  Or let me know the better process they should follow  Thank you so much for your caring concern for this patient-[name]

## 2023-07-15 ENCOUNTER — Telehealth: Payer: Self-pay | Admitting: *Deleted

## 2023-07-15 ENCOUNTER — Ambulatory Visit: Payer: BC Managed Care – PPO

## 2023-07-15 NOTE — Telephone Encounter (Signed)
Source  Delorice Toloddziecki (Non-Patient)   Subject  Joyce Jacobs, Chief Technology Officer (Patient)   Topic  General - Deceased Patient     Communication  Name of caller: Ray carter        Date of death: 2023/07/27        Name of funeral home: [employer]        Phone number of funeral home: [phone]        Provider that needs to sign form: Luking scott        Timeline for signing: soon as received

## 2023-07-15 NOTE — Telephone Encounter (Signed)
Home visit was completed.  Patient died 07/29/23 evening.  Should also be noted that I did a home visit on Friday evening and Sunday as courtesy medical visits to the family. 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

## 2023-07-15 NOTE — Telephone Encounter (Signed)
Thank you for updating me It would be helpful for someone within your staffing or similar department that handles this issue to reach out to the husband on his cell number to let him know how to go about getting this information gathered so that they can complete that process  He spoke with disability services that told him that they are keeping the case open until they receive that information  As for now-I will step away from this issue and asked that it be communicated by your department to Joyce Jacobs Thank [employer]

## 2023-07-16 ENCOUNTER — Ambulatory Visit: Payer: BC Managed Care – PPO

## 2023-07-16 NOTE — Telephone Encounter (Signed)
Please call [employer] I did fill out her death certificate on [place] Joyce Jacobs which is the electronic portal that we have to do for death certificate is Is here an additional form?  For some reason did not go through?  Thank you  I actually did her death certificate late August 04, 2023 night-thank you

## 2023-07-17 ENCOUNTER — Ambulatory Visit: Payer: BC Managed Care – PPO

## 2023-07-17 NOTE — Telephone Encounter (Signed)
[facility] stated they received it and have everything they need.

## 2023-07-19 ENCOUNTER — Ambulatory Visit: Payer: BC Managed Care – PPO

## 2023-07-20 ENCOUNTER — Ambulatory Visit: Payer: BC Managed Care – PPO

## 2023-07-21 ENCOUNTER — Ambulatory Visit: Payer: BC Managed Care – PPO

## 2023-07-21 ENCOUNTER — Inpatient Hospital Stay: Payer: BC Managed Care – PPO | Admitting: Internal Medicine

## 2023-07-21 ENCOUNTER — Inpatient Hospital Stay: Payer: BC Managed Care – PPO

## 2023-07-22 ENCOUNTER — Ambulatory Visit: Payer: BC Managed Care – PPO

## 2023-07-26 NOTE — Telephone Encounter (Signed)
Date of death: Patient has is currently in her final moments, Joyce Jacobs called to advise Dr.Luking since he usually makes house calls for the patient.

## 2023-07-26 NOTE — Telephone Encounter (Signed)
Source  Wilkie Aye [facility] Nurse) (Non-Patient)   Subject  Joyce Jacobs (Patient)   Topic  General - Deceased Patient    Communication  Name of caller: Wilkie Aye        Date of death: Patient has is currently in her final moments, Wilkie Aye called to advise Dr.Luking since he usually makes house calls for the patient.        Name of funeral home:        Phone number of funeral home:        Provider that needs to sign form:        Timeline for signing:

## 2023-07-26 NOTE — Progress Notes (Signed)
Patient transitioned to hospice care, disenrolled from Specialty Pharmacy services.

## 2023-07-26 DEATH — deceased

## 2023-07-27 ENCOUNTER — Ambulatory Visit: Payer: BC Managed Care – PPO

## 2023-07-28 ENCOUNTER — Ambulatory Visit: Payer: BC Managed Care – PPO

## 2023-07-29 ENCOUNTER — Ambulatory Visit: Payer: BC Managed Care – PPO

## 2023-07-30 ENCOUNTER — Encounter (HOSPITAL_COMMUNITY): Payer: Self-pay

## 2023-07-30 ENCOUNTER — Ambulatory Visit: Payer: BC Managed Care – PPO

## 2023-07-31 ENCOUNTER — Ambulatory Visit: Payer: BC Managed Care – PPO

## 2023-08-03 ENCOUNTER — Ambulatory Visit: Payer: BC Managed Care – PPO

## 2023-08-04 ENCOUNTER — Telehealth: Payer: Self-pay

## 2023-08-04 ENCOUNTER — Ambulatory Visit: Payer: BC Managed Care – PPO

## 2023-08-04 NOTE — Telephone Encounter (Signed)
Patient dropped off document Insurance Form, to be filled out by provider. Patient requested to send it back via Call Patient to pick up within ASAP. Document is located in providers tray at front office.Please advise at Mobile [phone]  Placed in red folder up front

## 2023-08-05 ENCOUNTER — Ambulatory Visit: Payer: BC Managed Care – PPO

## 2023-08-05 NOTE — Telephone Encounter (Signed)
It should be noted that I was requested to fill out a insurance form Her record had to be accessed in order to fill out this form Form was filled out to the best my ability Please connect with the husband Have him check over the form Scan a copy into the chart If anything additional needs to be supplied or given please let me know, more than willing to help

## 2023-08-06 ENCOUNTER — Ambulatory Visit: Payer: BC Managed Care – PPO

## 2023-08-06 ENCOUNTER — Inpatient Hospital Stay: Payer: BC Managed Care – PPO

## 2023-08-06 ENCOUNTER — Inpatient Hospital Stay: Payer: BC Managed Care – PPO | Admitting: Internal Medicine

## 2023-12-13 ENCOUNTER — Telehealth: Payer: Self-pay | Admitting: Family Medicine

## 2023-12-13 NOTE — Telephone Encounter (Signed)
 I received a form from the insurance company regarding this patient It was filled out Please fax it back as requested Please scan a copy into the chart Thank you-Dr. Geralyn Knee

## 2023-12-21 ENCOUNTER — Telehealth: Payer: Self-pay | Admitting: *Deleted

## 2023-12-21 NOTE — Telephone Encounter (Signed)
 Received a call from [employer] as [employer] [phone] wanting to see if we got a fax for this patient.  Upon further investigation found that the fax they sent to was incorrect.  Provided updated fax and they will fax to that.  Pending receipt of that.

## 2024-01-04 ENCOUNTER — Telehealth: Payer: Self-pay

## 2024-01-04 NOTE — Telephone Encounter (Signed)
 Had to open pt chart to obtain information for a cancer claim.
